# Patient Record
Sex: Female | Born: 1940 | Race: White | Hispanic: No | Marital: Married | Smoking: Never smoker
Health system: Southern US, Community
[De-identification: ages and names within clinical notes are randomized; demographics above are authoritative.]

## PROBLEM LIST (undated history)

## (undated) DIAGNOSIS — F32A Depression, unspecified: Secondary | ICD-10-CM

## (undated) DIAGNOSIS — E785 Hyperlipidemia, unspecified: Secondary | ICD-10-CM

## (undated) DIAGNOSIS — M1990 Unspecified osteoarthritis, unspecified site: Secondary | ICD-10-CM

## (undated) DIAGNOSIS — I2510 Atherosclerotic heart disease of native coronary artery without angina pectoris: Secondary | ICD-10-CM

## (undated) DIAGNOSIS — F329 Major depressive disorder, single episode, unspecified: Secondary | ICD-10-CM

## (undated) DIAGNOSIS — I10 Essential (primary) hypertension: Secondary | ICD-10-CM

## (undated) DIAGNOSIS — F419 Anxiety disorder, unspecified: Secondary | ICD-10-CM

## (undated) DIAGNOSIS — E079 Disorder of thyroid, unspecified: Secondary | ICD-10-CM

## (undated) DIAGNOSIS — I4891 Unspecified atrial fibrillation: Secondary | ICD-10-CM

## (undated) HISTORY — DX: Unspecified osteoarthritis, unspecified site: M19.90

## (undated) HISTORY — PX: PACEMAKER INSERTION: SHX728

## (undated) HISTORY — DX: Major depressive disorder, single episode, unspecified: F32.9

## (undated) HISTORY — DX: Essential (primary) hypertension: I10

## (undated) HISTORY — PX: CHOLECYSTECTOMY: SHX55

## (undated) HISTORY — DX: Anxiety disorder, unspecified: F41.9

## (undated) HISTORY — PX: CORONARY ARTERY BYPASS GRAFT: SHX141

## (undated) HISTORY — PX: APPENDECTOMY: SHX54

## (undated) HISTORY — DX: Hyperlipidemia, unspecified: E78.5

## (undated) HISTORY — PX: BLADDER SURGERY: SHX569

## (undated) HISTORY — DX: Disorder of thyroid, unspecified: E07.9

## (undated) HISTORY — DX: Depression, unspecified: F32.A

## (undated) HISTORY — PX: CARDIAC CATHETERIZATION: SHX172

## (undated) HISTORY — DX: Unspecified atrial fibrillation: I48.91

## (undated) SURGERY — Surgical Case
Anesthesia: *Unknown

## (undated) SURGERY — LEFT HEART CATH
Anesthesia: Moderate Sedation

---

## 1970-04-17 HISTORY — PX: VAGINAL HYSTERECTOMY: SUR661

## 2014-01-08 LAB — CBC AND DIFFERENTIAL
HCT: 42 % (ref 36–46)
HEMOGLOBIN: 13.7 g/dL (ref 12.0–16.0)
Platelets: 280 10*3/uL (ref 150–399)
WBC: 9.2 10^3/mL

## 2014-02-20 LAB — BASIC METABOLIC PANEL
BUN: 16 mg/dL (ref 4–21)
Creatinine: 1.1 mg/dL (ref <=1.1)
Glucose: 132 mg/dL
Potassium: 3.8 mmol/L (ref 3.4–5.3)
SODIUM: 142 mmol/L (ref 137–147)

## 2014-02-20 LAB — HEPATIC FUNCTION PANEL
ALK PHOS: 190 U/L — AB (ref 25–125)
ALT: 73 U/L — AB (ref 7–35)
AST: 87 U/L — AB (ref 13–35)
BILIRUBIN, TOTAL: 0.3 mg/dL

## 2014-06-30 LAB — TSH: TSH: 13.54 u[IU]/mL — AB (ref <=5.90)

## 2014-07-21 DIAGNOSIS — Z950 Presence of cardiac pacemaker: Secondary | ICD-10-CM | POA: Insufficient documentation

## 2014-09-05 DIAGNOSIS — J309 Allergic rhinitis, unspecified: Secondary | ICD-10-CM | POA: Insufficient documentation

## 2014-09-05 DIAGNOSIS — K5790 Diverticulosis of intestine, part unspecified, without perforation or abscess without bleeding: Secondary | ICD-10-CM | POA: Insufficient documentation

## 2014-09-05 DIAGNOSIS — I779 Disorder of arteries and arterioles, unspecified: Secondary | ICD-10-CM | POA: Insufficient documentation

## 2014-09-05 DIAGNOSIS — G603 Idiopathic progressive neuropathy: Secondary | ICD-10-CM | POA: Insufficient documentation

## 2014-09-05 DIAGNOSIS — I4891 Unspecified atrial fibrillation: Secondary | ICD-10-CM | POA: Insufficient documentation

## 2014-09-05 DIAGNOSIS — I350 Nonrheumatic aortic (valve) stenosis: Secondary | ICD-10-CM | POA: Insufficient documentation

## 2014-09-05 DIAGNOSIS — I252 Old myocardial infarction: Secondary | ICD-10-CM | POA: Insufficient documentation

## 2014-09-05 DIAGNOSIS — I2510 Atherosclerotic heart disease of native coronary artery without angina pectoris: Secondary | ICD-10-CM | POA: Insufficient documentation

## 2014-09-05 DIAGNOSIS — F329 Major depressive disorder, single episode, unspecified: Secondary | ICD-10-CM | POA: Insufficient documentation

## 2014-09-05 DIAGNOSIS — M1990 Unspecified osteoarthritis, unspecified site: Secondary | ICD-10-CM | POA: Insufficient documentation

## 2014-09-05 DIAGNOSIS — M797 Fibromyalgia: Secondary | ICD-10-CM | POA: Insufficient documentation

## 2014-09-05 DIAGNOSIS — I739 Peripheral vascular disease, unspecified: Secondary | ICD-10-CM

## 2014-09-05 DIAGNOSIS — E039 Hypothyroidism, unspecified: Secondary | ICD-10-CM | POA: Insufficient documentation

## 2014-09-05 DIAGNOSIS — I10 Essential (primary) hypertension: Secondary | ICD-10-CM | POA: Insufficient documentation

## 2014-09-05 DIAGNOSIS — F09 Unspecified mental disorder due to known physiological condition: Secondary | ICD-10-CM | POA: Insufficient documentation

## 2014-09-05 DIAGNOSIS — E785 Hyperlipidemia, unspecified: Secondary | ICD-10-CM | POA: Insufficient documentation

## 2014-09-05 DIAGNOSIS — C44311 Basal cell carcinoma of skin of nose: Secondary | ICD-10-CM | POA: Insufficient documentation

## 2014-09-05 DIAGNOSIS — F419 Anxiety disorder, unspecified: Secondary | ICD-10-CM

## 2014-09-05 DIAGNOSIS — I517 Cardiomegaly: Secondary | ICD-10-CM | POA: Insufficient documentation

## 2014-09-29 ENCOUNTER — Encounter: Payer: Self-pay | Admitting: Family Medicine

## 2014-09-29 ENCOUNTER — Ambulatory Visit (INDEPENDENT_AMBULATORY_CARE_PROVIDER_SITE_OTHER): Payer: Medicare Other | Admitting: Family Medicine

## 2014-09-29 VITALS — BP 130/64 | HR 61 | Temp 98.1°F | Resp 16

## 2014-09-29 DIAGNOSIS — Z8669 Personal history of other diseases of the nervous system and sense organs: Secondary | ICD-10-CM | POA: Diagnosis not present

## 2014-09-29 DIAGNOSIS — Z283 Underimmunization status: Secondary | ICD-10-CM

## 2014-09-29 DIAGNOSIS — Z2839 Other underimmunization status: Secondary | ICD-10-CM

## 2014-09-29 NOTE — Progress Notes (Signed)
Subjective:     Patient ID: Ruth Hodges, female   DOB: Sep 18, 1940, 74 y.o.   MRN: [id number]  HPI  Chief Complaint  Patient presents with  . Eye Drainage    patient reports left eye draining and crusting of lid intermittent for the past 3-4 months she also reports of sinus headache behind left eye.   states both intermittent eye pain and clear, watery drainage have been chronic problems for years.   Review of Systems  Eyes: Negative for visual disturbance (no change in her vision during these episodes).       Objective:   Physical Exam  Constitutional: She appears well-developed and well-nourished. No distress.  Eyes: EOM are normal. Pupils are equal, round, and reactive to light. Right eye exhibits no discharge. Left eye exhibits no discharge.       Assessment:      1. History of drainage from eye  - Ambulatory referral to Ophthalmology  2. Immunization deficiency Insurance does not cover Zostavax/ patient defers immunization    Plan:     F/u with primary M.D.per routine

## 2014-09-29 NOTE — Patient Instructions (Addendum)
You will be called regarding eye appointment.

## 2014-10-09 ENCOUNTER — Other Ambulatory Visit: Payer: Self-pay | Admitting: Family Medicine

## 2014-10-12 ENCOUNTER — Other Ambulatory Visit: Payer: Self-pay | Admitting: Family Medicine

## 2014-10-12 ENCOUNTER — Telehealth: Payer: Self-pay | Admitting: Family Medicine

## 2014-10-12 NOTE — Telephone Encounter (Signed)
This is Dr. Gilbert's patient. Please review. Thank you-aa 

## 2014-10-12 NOTE — Telephone Encounter (Signed)
Pt contacted office for refill request on the following medications:  traMADol (ULTRAM) 50 MG tablet.  Right [place].  CB#[phone]

## 2014-10-12 NOTE — Telephone Encounter (Signed)
Pt called to see if the RX for Tramadol 50 mg could be resent to [facility]. [facility]. Because she was told they didn't receive the RX. It looks like Dr. Rosanna Randy sent it on 10/09/14. Pt stated she is out of the Tramadol 50 mg. Thanks TNP

## 2014-10-13 ENCOUNTER — Other Ambulatory Visit: Payer: Self-pay

## 2014-10-14 ENCOUNTER — Inpatient Hospital Stay
Admission: EM | Admit: 2014-10-14 | Discharge: 2014-10-16 | DRG: 281 | Disposition: A | Discharge status: Home / Self Care | Payer: Medicare Other | Attending: Internal Medicine | Admitting: Internal Medicine

## 2014-10-14 ENCOUNTER — Observation Stay
Admit: 2014-10-14 | Discharge: 2014-10-14 | Disposition: A | Discharge status: Home / Self Care | Payer: Medicare Other | Attending: Internal Medicine | Admitting: Internal Medicine

## 2014-10-14 ENCOUNTER — Emergency Department: Payer: Medicare Other

## 2014-10-14 ENCOUNTER — Encounter: Payer: Self-pay | Admitting: Emergency Medicine

## 2014-10-14 DIAGNOSIS — M1990 Unspecified osteoarthritis, unspecified site: Secondary | ICD-10-CM | POA: Diagnosis present

## 2014-10-14 DIAGNOSIS — Z7982 Long term (current) use of aspirin: Secondary | ICD-10-CM

## 2014-10-14 DIAGNOSIS — I200 Unstable angina: Secondary | ICD-10-CM

## 2014-10-14 DIAGNOSIS — I2510 Atherosclerotic heart disease of native coronary artery without angina pectoris: Secondary | ICD-10-CM | POA: Diagnosis present

## 2014-10-14 DIAGNOSIS — I10 Essential (primary) hypertension: Secondary | ICD-10-CM | POA: Diagnosis present

## 2014-10-14 DIAGNOSIS — I25110 Atherosclerotic heart disease of native coronary artery with unstable angina pectoris: Secondary | ICD-10-CM | POA: Diagnosis present

## 2014-10-14 DIAGNOSIS — F329 Major depressive disorder, single episode, unspecified: Secondary | ICD-10-CM | POA: Diagnosis present

## 2014-10-14 DIAGNOSIS — I214 Non-ST elevation (NSTEMI) myocardial infarction: Secondary | ICD-10-CM | POA: Diagnosis present

## 2014-10-14 DIAGNOSIS — Z885 Allergy status to narcotic agent status: Secondary | ICD-10-CM

## 2014-10-14 DIAGNOSIS — Z955 Presence of coronary angioplasty implant and graft: Secondary | ICD-10-CM

## 2014-10-14 DIAGNOSIS — F419 Anxiety disorder, unspecified: Secondary | ICD-10-CM | POA: Diagnosis present

## 2014-10-14 DIAGNOSIS — Z7901 Long term (current) use of anticoagulants: Secondary | ICD-10-CM

## 2014-10-14 DIAGNOSIS — N390 Urinary tract infection, site not specified: Secondary | ICD-10-CM | POA: Diagnosis present

## 2014-10-14 DIAGNOSIS — Z888 Allergy status to other drugs, medicaments and biological substances status: Secondary | ICD-10-CM

## 2014-10-14 DIAGNOSIS — Z951 Presence of aortocoronary bypass graft: Secondary | ICD-10-CM

## 2014-10-14 DIAGNOSIS — E785 Hyperlipidemia, unspecified: Secondary | ICD-10-CM | POA: Diagnosis present

## 2014-10-14 DIAGNOSIS — Z950 Presence of cardiac pacemaker: Secondary | ICD-10-CM

## 2014-10-14 DIAGNOSIS — E039 Hypothyroidism, unspecified: Secondary | ICD-10-CM | POA: Diagnosis present

## 2014-10-14 DIAGNOSIS — Z79899 Other long term (current) drug therapy: Secondary | ICD-10-CM

## 2014-10-14 DIAGNOSIS — E876 Hypokalemia: Secondary | ICD-10-CM | POA: Diagnosis present

## 2014-10-14 DIAGNOSIS — I252 Old myocardial infarction: Secondary | ICD-10-CM

## 2014-10-14 DIAGNOSIS — I272 Other secondary pulmonary hypertension: Secondary | ICD-10-CM | POA: Diagnosis present

## 2014-10-14 DIAGNOSIS — I4891 Unspecified atrial fibrillation: Secondary | ICD-10-CM | POA: Diagnosis present

## 2014-10-14 HISTORY — DX: Atherosclerotic heart disease of native coronary artery without angina pectoris: I25.10

## 2014-10-14 LAB — URINALYSIS COMPLETE WITH MICROSCOPIC (ARMC ONLY)
Bilirubin Urine: NEGATIVE
Glucose, UA: NEGATIVE mg/dL
KETONES UR: NEGATIVE mg/dL
Nitrite: POSITIVE — AB
PROTEIN: NEGATIVE mg/dL
Specific Gravity, Urine: 1.012 (ref 1.005–1.030)
pH: 5 (ref 5.0–8.0)

## 2014-10-14 LAB — CBC
HCT: 33.3 % — ABNORMAL LOW (ref 35.0–47.0)
HEMATOCRIT: 32.8 % — AB (ref 35.0–47.0)
HEMOGLOBIN: 10.4 g/dL — AB (ref 12.0–16.0)
Hemoglobin: 10.2 g/dL — ABNORMAL LOW (ref 12.0–16.0)
MCH: 26.8 pg (ref 26.0–34.0)
MCH: 27.1 pg (ref 26.0–34.0)
MCHC: 31.1 g/dL — ABNORMAL LOW (ref 32.0–36.0)
MCHC: 31.2 g/dL — AB (ref 32.0–36.0)
MCV: 86.2 fL (ref 80.0–100.0)
MCV: 86.8 fL (ref 80.0–100.0)
PLATELETS: 188 10*3/uL (ref 150–440)
PLATELETS: 167 10*3/uL (ref 150–440)
RBC: 3.8 MIL/uL (ref 3.80–5.20)
RBC: 3.84 MIL/uL (ref 3.80–5.20)
RDW: 15.8 % — ABNORMAL HIGH (ref 11.5–14.5)
RDW: 15.7 % — ABNORMAL HIGH (ref 11.5–14.5)
WBC: 11.4 10*3/uL — AB (ref 3.6–11.0)
WBC: 8.2 10*3/uL (ref 3.6–11.0)

## 2014-10-14 LAB — COMPREHENSIVE METABOLIC PANEL
ALBUMIN: 3.2 g/dL — AB (ref 3.5–5.0)
ALT: 20 U/L (ref 14–54)
ANION GAP: 10 (ref 5–15)
AST: 40 U/L (ref 15–41)
Alkaline Phosphatase: 133 U/L — ABNORMAL HIGH (ref 38–126)
BUN: 13 mg/dL (ref 6–20)
CO2: 25 mmol/L (ref 22–32)
Calcium: 8.7 mg/dL — ABNORMAL LOW (ref 8.9–10.3)
Chloride: 106 mmol/L (ref 101–111)
Creatinine, Ser: 0.87 mg/dL (ref 0.44–1.00)
GFR calc Af Amer: >60 mL/min (ref >60)
GFR calc non Af Amer: >60 mL/min (ref >60)
GLUCOSE: 131 mg/dL — AB (ref 65–99)
POTASSIUM: 3.2 mmol/L — AB (ref 3.5–5.1)
SODIUM: 141 mmol/L (ref 135–145)
TOTAL PROTEIN: 6.6 g/dL (ref 6.5–8.1)
Total Bilirubin: 0.4 mg/dL (ref 0.3–1.2)

## 2014-10-14 LAB — APTT: aPTT: 54 seconds — ABNORMAL HIGH (ref 24–36)

## 2014-10-14 LAB — BASIC METABOLIC PANEL
ANION GAP: 10 (ref 5–15)
BUN: 14 mg/dL (ref 6–20)
CALCIUM: 9 mg/dL (ref 8.9–10.3)
CHLORIDE: 106 mmol/L (ref 101–111)
CO2: 28 mmol/L (ref 22–32)
Creatinine, Ser: 0.83 mg/dL (ref 0.44–1.00)
GFR calc non Af Amer: >60 mL/min (ref >60)
Glucose, Bld: 96 mg/dL (ref 65–99)
Potassium: 3.3 mmol/L — ABNORMAL LOW (ref 3.5–5.1)
SODIUM: 144 mmol/L (ref 135–145)

## 2014-10-14 LAB — HEPARIN LEVEL (UNFRACTIONATED)

## 2014-10-14 LAB — TSH: TSH: 1.227 u[IU]/mL (ref 0.350–4.500)

## 2014-10-14 LAB — TROPONIN I
TROPONIN I: 0.07 ng/mL — AB (ref <0.031)
Troponin I: 0.04 ng/mL — ABNORMAL HIGH (ref <0.031)
Troponin I: 0.12 ng/mL — ABNORMAL HIGH (ref <0.031)

## 2014-10-14 LAB — GLUCOSE, CAPILLARY: Glucose-Capillary: 84 mg/dL (ref 65–99)

## 2014-10-14 LAB — MRSA PCR SCREENING: MRSA BY PCR: NEGATIVE

## 2014-10-14 MED ORDER — TRAMADOL HCL 50 MG PO TABS: 50.0000 mg | ORAL_TABLET | Freq: Three times a day (TID) | ORAL | Status: DC

## 2014-10-14 MED ORDER — FELODIPINE ER 5 MG PO TB24
10.0000 mg | ORAL_TABLET | Freq: Every day | ORAL | Status: DC
  Administered 2014-10-14 – 2014-10-15 (×2): 10 mg via ORAL
  Filled 2014-10-14: qty 2
  Filled 2014-10-14: qty 1

## 2014-10-14 MED ORDER — AMITRIPTYLINE HCL 50 MG PO TABS
100.0000 mg | ORAL_TABLET | Freq: Every day | ORAL | Status: DC
  Administered 2014-10-14 – 2014-10-15 (×2): 100 mg via ORAL
  Filled 2014-10-14 (×2): qty 2

## 2014-10-14 MED ORDER — TRAMADOL HCL 50 MG PO TABS
50.0000 mg | ORAL_TABLET | Freq: Three times a day (TID) | ORAL | Status: DC | PRN
  Administered 2014-10-14 – 2014-10-16 (×5): 50 mg via ORAL
  Filled 2014-10-14 (×5): qty 1

## 2014-10-14 MED ORDER — ACETAMINOPHEN 325 MG PO TABS
650.0000 mg | ORAL_TABLET | Freq: Four times a day (QID) | ORAL | Status: DC | PRN
  Administered 2014-10-14: 650 mg via ORAL
  Filled 2014-10-14: qty 2

## 2014-10-14 MED ORDER — ACETAMINOPHEN 650 MG RE SUPP: 650.0000 mg | Freq: Four times a day (QID) | RECTAL | Status: DC | PRN

## 2014-10-14 MED ORDER — HEPARIN (PORCINE) IN NACL 100-0.45 UNIT/ML-% IJ SOLN
1100.0000 [IU]/h | INTRAMUSCULAR | Status: DC
  Administered 2014-10-14: 850 [IU]/h via INTRAVENOUS
  Administered 2014-10-15: 1150 [IU]/h via INTRAVENOUS
  Filled 2014-10-14 (×6): qty 250

## 2014-10-14 MED ORDER — SIMVASTATIN 40 MG PO TABS
40.0000 mg | ORAL_TABLET | Freq: Every evening | ORAL | Status: DC
  Administered 2014-10-14 – 2014-10-15 (×2): 40 mg via ORAL
  Filled 2014-10-14 (×2): qty 1

## 2014-10-14 MED ORDER — ASPIRIN EC 325 MG PO TBEC
325.0000 mg | DELAYED_RELEASE_TABLET | Freq: Once | ORAL | Status: DC
  Filled 2014-10-14: qty 1

## 2014-10-14 MED ORDER — LEVOTHYROXINE SODIUM 150 MCG PO TABS
150.0000 ug | ORAL_TABLET | Freq: Every day | ORAL | Status: DC
  Administered 2014-10-14 – 2014-10-16 (×3): 150 ug via ORAL
  Filled 2014-10-14 (×3): qty 1

## 2014-10-14 MED ORDER — SODIUM CHLORIDE 0.9 % IJ SOLN
3.0000 mL | Freq: Two times a day (BID) | INTRAMUSCULAR | Status: DC
  Administered 2014-10-14 – 2014-10-16 (×5): 3 mL via INTRAVENOUS

## 2014-10-14 MED ORDER — APIXABAN 5 MG PO TABS
5.0000 mg | ORAL_TABLET | Freq: Two times a day (BID) | ORAL | Status: DC
  Administered 2014-10-14: 5 mg via ORAL
  Filled 2014-10-14: qty 1

## 2014-10-14 MED ORDER — POTASSIUM CHLORIDE CRYS ER 20 MEQ PO TBCR
20.0000 meq | EXTENDED_RELEASE_TABLET | Freq: Two times a day (BID) | ORAL | Status: DC
  Administered 2014-10-14 – 2014-10-16 (×5): 20 meq via ORAL
  Filled 2014-10-14 (×5): qty 1

## 2014-10-14 MED ORDER — LOSARTAN POTASSIUM 50 MG PO TABS
100.0000 mg | ORAL_TABLET | Freq: Every day | ORAL | Status: DC
  Administered 2014-10-14 – 2014-10-16 (×3): 100 mg via ORAL
  Filled 2014-10-14 (×3): qty 2

## 2014-10-14 MED ORDER — SODIUM CHLORIDE 0.9 % IV SOLN
INTRAVENOUS | Status: AC
  Administered 2014-10-14: 06:00:00 via INTRAVENOUS

## 2014-10-14 MED ORDER — DIGOXIN 125 MCG PO TABS
0.1250 mg | ORAL_TABLET | Freq: Every day | ORAL | Status: DC
  Administered 2014-10-14 – 2014-10-16 (×3): 0.125 mg via ORAL
  Filled 2014-10-14 (×3): qty 1

## 2014-10-14 NOTE — Progress Notes (Addendum)
ANTICOAGULATION CONSULT NOTE - Initial Consult  Pharmacy Consult for Heparin Indication: chest pain/ACS  Allergies  Allergen Reactions  . Atorvastatin   . Codeine   . Ceftin  [Cefuroxime Axetil]     Patient Measurements: Height: 5\' 3"  (160 cm) Weight: 167 lb 8 oz (75.978 kg) IBW/kg (Calculated) : 52.4 Heparin Dosing Weight: 70.2 kg  Vital Signs: Temp: 97.8 F (36.6 C) (06/29 1137) Temp Source: Oral (06/29 1137) BP: 117/44 mmHg (06/29 1137) Pulse Rate: 65 (06/29 1137)  Labs:  Recent Labs  10/14/14 0034 10/14/14 1024  HGB 10.2* 10.4*  HCT 32.8* 33.3*  PLT 188 167  CREATININE 0.87 0.83  TROPONINI 0.04* 0.12*    Estimated Creatinine Clearance: 56.3 mL/min (by C-G formula based on Cr of 0.83).   Medical History: Past Medical History  Diagnosis Date  . Anxiety   . Depression   . A-fib   . Arthritis   . Hypertension   . Hyperlipidemia   . Thyroid disease   . CAD (coronary artery disease)     Medications:  Scheduled:  . digoxin  0.125 mg Oral Daily  . felodipine  10 mg Oral Daily  . levothyroxine  150 mcg Oral QAC breakfast  . losartan  100 mg Oral Daily  . potassium chloride  20 mEq Oral BID  . simvastatin  40 mg Oral QPM  . sodium chloride  3 mL Intravenous Q12H   Infusions:  . sodium chloride 100 mL/hr at 10/14/14 0607  . heparin     PRN: acetaminophen **OR** acetaminophen, traMADol  Assessment: Patient admitted with CP and a h/o afib on apixaban now with elevated troponin to transition to heparin drip.   Goal of Therapy:  Heparin level 0.3-0.7 units/ml or goal APTT Monitor platelets by anticoagulation protocol: Yes   Plan:  Will order baseline aPTT and HL but will likely need to dose by aPTT as patient previously on apixaban. Will start heparin drip without bolus at 850 units/hr.   Ulice Dash D 10/14/2014,2:12 PM

## 2014-10-14 NOTE — ED Provider Notes (Signed)
[facility] Emergency Department Provider Note  ____________________________________________  Time seen: 1:50 AM  I have reviewed the triage vital signs and the nursing notes.   HISTORY  Chief Complaint Chest Pain      HPI Ruth Hodges is a 74 y.o. female resents with intermittent chest pain 3 days. Patient states that the pain radiates to her back and left arm patient is currently pain-free this is status post taking 4 sublingual nitroglycerin. Patient states that the onset of the pain was when she was trying to go to sleep. Of note patient has a history of three-vessel coronary artery bypass graft which was performed 19 years ago, in addition patient has had 2 cardiac stents placed approximately 8-9 years ago. Patient denies any dyspnea no chest pain at present no pain or swelling in her extremities    Past Medical History  Diagnosis Date  . Anxiety   . Depression   . A-fib   . Arthritis   . Hypertension   . Hyperlipidemia   . Thyroid disease   . Fibrositis   . Carotid arterial disease     Patient Active Problem List   Diagnosis Date Noted  . Allergic rhinitis 09/05/2014  . Arthritis 09/05/2014  . A-fib 09/05/2014  . 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 Arteriosclerosis of coronary artery 09/05/2014  . Anxiety and depression 09/05/2014  . Essential (primary) hypertension 09/05/2014  . Fibrositis 09/05/2014  . H/O acute myocardial infarction 09/05/2014  . DD (diverticular disease) 09/05/2014  . HLD (hyperlipidemia) 09/05/2014  . Adult hypothyroidism 09/05/2014  . 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 Mild cognitive disorder 09/05/2014  . Carotid arterial disease 09/05/2014  . Left ventricular hypertrophy 09/05/2014  . Aortic heart valve narrowing 09/05/2014  . Artificial cardiac pacemaker 07/21/2014    Past Surgical History  Procedure Laterality Date  . Pacemaker insertion    . Cardiac catheterization    .  Bladder surgery    . Cholecystectomy    . Appendectomy    . Coronary artery bypass graft    . Vaginal hysterectomy  1972    total, ovaries removed in 1983    Current Outpatient Rx  Name  Route  Sig  Dispense  Refill  . amiodarone (PACERONE) 200 MG tablet   Oral   Take by mouth.         Marland Kitchen amitriptyline (ELAVIL) 100 MG tablet   Oral   Take by mouth.         Marland Kitchen apixaban (ELIQUIS) 5 MG TABS tablet   Oral   Take by mouth.         . Biotin 2500 MCG CAPS   Oral   Take by mouth.         Marland Kitchen CALCIUM-VITAMIN D PO   Oral   Take by mouth.         . Cholecalciferol (VITAMIN D3) 1000 UNITS CAPS   Oral   Take by mouth.         . cyanocobalamin 100 MCG tablet   Oral   Take by mouth.         . digoxin (LANOXIN) 0.125 MG tablet   Oral   Take by mouth.         . felodipine (PLENDIL) 10 MG 24 hr tablet   Oral   Take by mouth.         . levothyroxine (SYNTHROID, LEVOTHROID) 150 MCG tablet   Oral   Take by mouth.         Marland Kitchen  losartan (COZAAR) 100 MG tablet   Oral   Take by mouth.         Ernestine Conrad 3-6-9 CAPS   Oral   Take by mouth.         . simvastatin (ZOCOR) 40 MG tablet   Oral   Take by mouth.         . torsemide (DEMADEX) 20 MG tablet   Oral   Take by mouth.         . traMADol (ULTRAM) 50 MG tablet      take 1 tablet by mouth three times a day   90 tablet   0   . zinc gluconate 50 MG tablet   Oral   Take by mouth.           Allergies Atorvastatin; Codeine; and Ceftin   Family History  Problem Relation Age of Onset  . Heart attack Father   . CVA Sister   . Diabetes Brother   . Heart attack Brother     Social History History  Substance Use Topics  . Smoking status: Never Smoker   . Smokeless tobacco: Not on file  . Alcohol Use: Not on file    Review of Systems  Constitutional: Negative for fever. Eyes: Negative for visual changes. ENT: Negative for sore throat. Cardiovascular: Positive for chest  pain. Respiratory: Negative for shortness of breath. Gastrointestinal: Negative for abdominal pain, vomiting and diarrhea. Genitourinary: Negative for dysuria. Musculoskeletal: Negative for back pain. Skin: Negative for rash. Neurological: Negative for headaches, focal weakness or numbness.   10-point ROS otherwise negative.  ____________________________________________   PHYSICAL EXAM:  VITAL SIGNS: ED Triage Vitals  Enc Vitals Group     BP 10/14/14 0207 123/56 mmHg     Pulse Rate 10/14/14 0207 61     Resp 10/14/14 0207 17     Temp 10/14/14 0207 98.1 F (36.7 C)     Temp Source 10/14/14 0207 Oral     SpO2 10/14/14 0207 98 %     Weight 10/14/14 0207 179 lb (81.194 kg)     Height 10/14/14 0207 5\' 3"  (1.6 m)     Head Cir --      Peak Flow --      Pain Score --      Pain Loc --      Pain Edu? --      Excl. in [place]? --      Constitutional: Alert and oriented. Well appearing and in no distress. Eyes: Conjunctivae are normal. PERRL. Normal extraocular movements. ENT   Head: Normocephalic and atraumatic.   Nose: No congestion/rhinnorhea.   Mouth/Throat: Mucous membranes are moist.   Neck: No stridor. Cardiovascular: Normal rate, regular rhythm. Normal and symmetric distal pulses are present in all extremities. Grade 3 systolic ejection murmur Respiratory: Normal respiratory effort without tachypnea nor retractions. Breath sounds are clear and equal bilaterally. No wheezes/rales/rhonchi. Gastrointestinal: Soft and nontender. No distention. There is no CVA tenderness. Genitourinary: deferred Musculoskeletal: Nontender with normal range of motion in all extremities. No joint effusions.  No lower extremity tenderness nor edema. Neurologic:  Normal speech and language. No gross focal neurologic deficits are appreciated. Speech is normal.  Skin:  Skin is warm, dry and intact. No rash noted. Psychiatric: Mood and affect are normal. Speech and behavior are normal.  Patient exhibits appropriate insight and judgment.  ____________________________________________    LABS (pertinent positives/negatives)  Labs Reviewed  CBC - Abnormal; Notable for the following:  WBC 11.4 (*)    Hemoglobin 10.2 (*)    HCT 32.8 (*)    MCHC 31.1 (*)    RDW 15.8 (*)    All other components within normal limits  COMPREHENSIVE METABOLIC PANEL - Abnormal; Notable for the following:    Potassium 3.2 (*)    Glucose, Bld 131 (*)    Calcium 8.7 (*)    Albumin 3.2 (*)    Alkaline Phosphatase 133 (*)    All other components within normal limits  TROPONIN I - Abnormal; Notable for the following:    Troponin I 0.04 (*)    All other components within normal limits    Date: 10/14/2014  Rate: 60  Rhythm: normal sinus rhythm  QRS Axis: normal  Intervals: normal  ST/T Wave abnormalities: normal  Conduction Disutrbances: none  Narrative Interpretation: unremarkable          RADIOLOGY  Chest x-ray revealed: IMPRESSION: No acute pulmonary process. ____________________________________________    INITIAL IMPRESSION / ASSESSMENT AND PLAN / ED COURSE  Pertinent labs & imaging results that were available during my care of the patient were reviewed by me and considered in my medical decision making (see chart for details).  Patient took aspirin before presentation to the emergency department as well as 4 nitroglycerin with no chest pain at present. EKG revealed no ST segment changes. ____________________________________________   FINAL CLINICAL IMPRESSION(S) / ED DIAGNOSES  Final diagnoses:  NSTEMI (non-ST elevated myocardial infarction)      Gregor Hams, MD 10/14/14 [phone]

## 2014-10-14 NOTE — H&P (Signed)
[facility] [place] NAME: Ruth Hodges    MR#:  [id number]  DATE OF BIRTH:  [birth year]  DATE OF ADMISSION:  10/14/2014  PRIMARY CARE PHYSICIAN: Wilhemena Durie, MD   REQUESTING/REFERRING PHYSICIAN: Owens Shark  CHIEF COMPLAINT:   Chief Complaint  Patient presents with  . Chest Pain    HISTORY OF PRESENT ILLNESS:  Ruth Hodges  is a 74 y.o. female who presents with an episode of unstable angina. Patient has known history of CAD, including CABG in the past, and subsequent stent placements. She states that she began having some chest pain the day prior to presenting to the ED, but that this chest pain resolved with aspirin and sublingual nitroglycerin. The day of presentation to ED she began having chest pain while at rest in her bed, which was not resolved by aspirin and sublingual nitroglycerin. This chest pain was substernal in origin, with radiation to bilateral shoulders and also "straight through to my back". It was not associated with significant diaphoresis or shortness of breath, nausea or vomiting, but the patient called EMS because the pain was not resolving. In the ED she was found to have a mildly elevated troponin is 0.04, but given her known cardiac history and complaint of chest pain, hospitalists were called for admission. Chest pain had resolved by the time of this writer's interview.  PAST MEDICAL HISTORY:   Past Medical History  Diagnosis Date  . Anxiety   . Depression   . A-fib   . Arthritis   . Hypertension   . Hyperlipidemia   . Thyroid disease   . Fibrositis   . Carotid arterial disease   . CAD (coronary artery disease)     PAST SURGICAL HISTORY:   Past Surgical History  Procedure Laterality Date  . Pacemaker insertion    . Cardiac catheterization    . Bladder surgery    . Cholecystectomy    . Appendectomy    . Coronary artery bypass graft    . Vaginal hysterectomy  1972    total, ovaries removed in  1983    SOCIAL HISTORY:   History  Substance Use Topics  . Smoking status: Never Smoker   . Smokeless tobacco: Not on file  . Alcohol Use: No    FAMILY HISTORY:   Family History  Problem Relation Age of Onset  . Heart attack Father   . CVA Sister   . Diabetes Brother   . Heart attack Brother     DRUG ALLERGIES:   Allergies  Allergen Reactions  . Atorvastatin   . Codeine   . Ceftin  [Cefuroxime Axetil]     MEDICATIONS AT HOME:   Prior to Admission medications   Medication Sig Start Date End Date Taking? Authorizing Provider  amitriptyline (ELAVIL) 100 MG tablet Take by mouth at bedtime.  08/31/14  Yes Historical Provider, MD  apixaban (ELIQUIS) 5 MG TABS tablet Take by mouth 2 (two) times daily.  08/31/14  Yes Historical Provider, MD  Biotin 2500 MCG CAPS Take by mouth. 08/31/14  Yes Historical Provider, MD  Cholecalciferol (VITAMIN D3) 1000 UNITS CAPS Take by mouth. 08/31/14  Yes Historical Provider, MD  cyanocobalamin 100 MCG tablet Take by mouth. 08/31/14  Yes Historical Provider, MD  digoxin (LANOXIN) 0.125 MG tablet Take 0.125 mg by mouth. Pt takes digitek 04/29/14  Yes Historical Provider, MD  felodipine (PLENDIL) 10 MG 24 hr tablet Take by mouth daily.  08/31/14  Yes Historical  Provider, MD  levothyroxine (SYNTHROID, LEVOTHROID) 150 MCG tablet Take by mouth daily.  04/29/14  Yes Historical Provider, MD  losartan (COZAAR) 100 MG tablet Take 100 mg by mouth daily.  04/29/14  Yes Historical Provider, MD  Omega 3-6-9 CAPS Take by mouth. 08/31/14  Yes Historical Provider, MD  torsemide (DEMADEX) 20 MG tablet Take by mouth daily.  08/31/14  Yes Historical Provider, MD  traMADol (ULTRAM) 50 MG tablet take 1 tablet by mouth three times a day Patient taking differently: take 1 tablet by mouth three times a day prn 10/12/14  Yes Birdie Sons, MD  amiodarone (PACERONE) 200 MG tablet Take by mouth. 08/31/14   Historical Provider, MD  CALCIUM-VITAMIN D PO Take by mouth. 08/31/14    Historical Provider, MD  simvastatin (ZOCOR) 40 MG tablet Take by mouth every evening.  08/31/14   Historical Provider, MD  zinc gluconate 50 MG tablet Take by mouth. 08/31/14   Historical Provider, MD    REVIEW OF SYSTEMS:  Review of Systems  Constitutional: Negative for fever, chills, weight loss and malaise/fatigue.  HENT: Negative for ear pain, hearing loss and tinnitus.   Eyes: Negative for blurred vision, double vision, pain and redness.  Respiratory: Negative for cough, hemoptysis and shortness of breath.   Cardiovascular: Positive for chest pain. Negative for palpitations, orthopnea and leg swelling.  Gastrointestinal: Negative for nausea, vomiting, abdominal pain, diarrhea and constipation.  Genitourinary: Negative for dysuria, frequency and hematuria.  Musculoskeletal: Negative for back pain, joint pain and neck pain.  Skin:       No acne, rash, or lesions  Neurological: Negative for dizziness, tremors, focal weakness and weakness.  Endo/Heme/Allergies: Negative for polydipsia. Does not bruise/bleed easily.  Psychiatric/Behavioral: Negative for depression. The patient is not nervous/anxious and does not have insomnia.      VITAL SIGNS:   Filed Vitals:   10/14/14 0207 10/14/14 0224 10/14/14 0300 10/14/14 0330  BP: 123/56  115/59 123/49  Pulse: 61 60 59 60  Temp: 98.1 F (36.7 C)     TempSrc: Oral     Resp: 17 23 12 12   Height: 5\' 3"  (1.6 m)     Weight: 81.194 kg (179 lb)     SpO2: 98% 100% 92% 92%   Wt Readings from Last 3 Encounters:  10/14/14 81.194 kg (179 lb)  06/30/14 78.472 kg (173 lb)    PHYSICAL EXAMINATION:  Physical Exam  Constitutional: She is oriented to person, place, and time. She appears well-developed and well-nourished. No distress.  HENT:  Head: Normocephalic and atraumatic.  Mouth/Throat: Oropharynx is clear and moist.  Eyes: Conjunctivae and EOM are normal. Pupils are equal, round, and reactive to light. No scleral icterus.  Neck: Normal  range of motion. Neck supple. No JVD present. No thyromegaly present.  Cardiovascular: Normal rate, regular rhythm and intact distal pulses.  Exam reveals no gallop and no friction rub.   Murmur (3/6 systolic) heard. Respiratory: Effort normal and breath sounds normal. No respiratory distress. She has no wheezes. She has no rales.  GI: Soft. Bowel sounds are normal. She exhibits no distension. There is no tenderness.  Musculoskeletal: Normal range of motion. She exhibits no edema.  No arthritis, no gout  Lymphadenopathy:    She has no cervical adenopathy.  Neurological: She is alert and oriented to person, place, and time. No cranial nerve deficit.  No dysarthria, no aphasia  Skin: Skin is warm and dry. No rash noted. No erythema.  Psychiatric: She has a  normal mood and affect. Her behavior is normal. Judgment and thought content normal.    LABORATORY PANEL:   CBC  Recent Labs Lab 10/14/14 0034  WBC 11.4*  HGB 10.2*  HCT 32.8*  PLT 188   ------------------------------------------------------------------------------------------------------------------  Chemistries   Recent Labs Lab 10/14/14 0034  NA 141  K 3.2*  CL 106  CO2 25  GLUCOSE 131*  BUN 13  CREATININE 0.87  CALCIUM 8.7*  AST 40  ALT 20  ALKPHOS 133*  BILITOT 0.4   ------------------------------------------------------------------------------------------------------------------  Cardiac Enzymes  Recent Labs Lab 10/14/14 0034  TROPONINI 0.04*   ------------------------------------------------------------------------------------------------------------------  RADIOLOGY:  Dg Chest Portable 1 View  10/14/2014   CLINICAL DATA:  Central chest pain radiating to the back. Left arm pain.  EXAM: PORTABLE CHEST - 1 VIEW  COMPARISON:  None.  FINDINGS: Dual lead left-sided pacemaker in place. Patient is post median sternotomy. The cardiomediastinal contours are normal. Pulmonary vasculature is normal. No  consolidation, pleural effusion, or pneumothorax. No acute osseous abnormalities are seen.  IMPRESSION: No acute pulmonary process.   Electronically Signed   By: Jeb Levering M.D.   On: 10/14/2014 03:03    EKG:  No orders found for this or any previous visit.  IMPRESSION AND PLAN:  Principal Problem:   Unstable angina - with elevated troponin, although only mildly elevated. Trend cardiac enzymes, get echocardiogram, cardiology consult. Active Problems:   Arteriosclerosis of coronary artery - continue appropriate home medications.   A-fib - patient is anticoagulated on Eliquis, continue this while here.   Essential (primary) hypertension - controlled, continue home meds.   HLD (hyperlipidemia) - continue home medications.   Adult hypothyroidism - continue home thyroid replacement.  All the records are reviewed and case discussed with ED provider. Management plans discussed with the patient and/or family.  DVT PROPHYLAXIS: Systemic anticoagulation  ADMISSION STATUS: Observation  CODE STATUS: Full Advance Directive Documentation        Most Recent Value   Type of Advance Directive  Living will, Healthcare Power of Attorney   Pre-existing out of facility DNR order (yellow form or pink MOST form)     "MOST" Form in Place?        TOTAL TIME TAKING CARE OF THIS PATIENT: 40 minutes.    Buren Havey [place] 10/14/2014, 4:13 AM  [facility]  Office  [phone]  CC: Primary care physician; Wilhemena Durie, MD

## 2014-10-14 NOTE — Progress Notes (Signed)
Communicated with Dr. Verdell Carmine r/t obtaining a prescription for amitriptyline 100mg  at bedtime

## 2014-10-14 NOTE — Progress Notes (Signed)
[facility] [place] NAME: Ruth Hodges    MR#:  [id number]  DATE OF BIRTH:  April 02, 1941  SUBJECTIVE:  CHIEF COMPLAINT:   Chief Complaint  Patient presents with  . Chest Pain    REVIEW OF SYSTEMS:  CONSTITUTIONAL: No fever, fatigue or weakness.  EYES: No blurred or double vision.  EARS, NOSE, AND THROAT: No tinnitus or ear pain.  RESPIRATORY: No cough, shortness of breath, wheezing or hemoptysis.  CARDIOVASCULAR:positive for chest pain and palpitation, No orthopnea, edema.  GASTROINTESTINAL: No nausea, vomiting, diarrhea or abdominal pain.  GENITOURINARY: No dysuria, hematuria.  ENDOCRINE: No polyuria, nocturia,  HEMATOLOGY: No anemia, easy bruising or bleeding SKIN: No rash or lesion. MUSCULOSKELETAL: No joint pain or arthritis.   NEUROLOGIC: No tingling, numbness, weakness.  PSYCHIATRY: No anxiety or depression.   ROS  DRUG ALLERGIES:   Allergies  Allergen Reactions  . Atorvastatin   . Codeine   . Ceftin  [Cefuroxime Axetil]     VITALS:  Blood pressure 117/44, pulse 65, temperature 97.8 F (36.6 C), temperature source Oral, resp. rate 17, height 5\' 3"  (1.6 m), weight 75.978 kg (167 lb 8 oz), SpO2 95 %.  PHYSICAL EXAMINATION:  GENERAL:  74 y.o.-year-old patient lying in the bed with no acute distress.  EYES: Pupils equal, round, reactive to light and accommodation. No scleral icterus. Extraocular muscles intact.  HEENT: Head atraumatic, normocephalic. Oropharynx and nasopharynx clear.  NECK:  Supple, no jugular venous distention. No thyroid enlargement, no tenderness.  LUNGS: Normal breath sounds bilaterally, no wheezing, rales,rhonchi or crepitation. No use of accessory muscles of respiration.  CARDIOVASCULAR: S1, S2 normal. positive murmurs, no rubs, or gallops.  ABDOMEN: Soft, nontender, nondistended. Bowel sounds present. No organomegaly or mass.  EXTREMITIES: No pedal edema, cyanosis, or clubbing.  NEUROLOGIC:  Cranial nerves II through XII are intact. Muscle strength 5/5 in all extremities. Sensation intact. Gait not checked.  PSYCHIATRIC: The patient is alert and oriented x 3.  SKIN: No obvious rash, lesion, or ulcer.   Physical Exam LABORATORY PANEL:   CBC  Recent Labs Lab 10/14/14 1024  WBC 8.2  HGB 10.4*  HCT 33.3*  PLT 167   ------------------------------------------------------------------------------------------------------------------  Chemistries   Recent Labs Lab 10/14/14 0034 10/14/14 1024  NA 141 144  K 3.2* 3.3*  CL 106 106  CO2 25 28  GLUCOSE 131* 96  BUN 13 14  CREATININE 0.87 0.83  CALCIUM 8.7* 9.0  AST 40  --   ALT 20  --   ALKPHOS 133*  --   BILITOT 0.4  --    ------------------------------------------------------------------------------------------------------------------  Cardiac Enzymes  Recent Labs Lab 10/14/14 0034 10/14/14 1024  TROPONINI 0.04* 0.12*   ------------------------------------------------------------------------------------------------------------------  RADIOLOGY:  Dg Chest Portable 1 View  10/14/2014   CLINICAL DATA:  Central chest pain radiating to the back. Left arm pain.  EXAM: PORTABLE CHEST - 1 VIEW  COMPARISON:  None.  FINDINGS: Dual lead left-sided pacemaker in place. Patient is post median sternotomy. The cardiomediastinal contours are normal. Pulmonary vasculature is normal. No consolidation, pleural effusion, or pneumothorax. No acute osseous abnormalities are seen.  IMPRESSION: No acute pulmonary process.   Electronically Signed   By: Jeb Levering M.D.   On: 10/14/2014 03:03    ASSESSMENT AND PLAN:   *  Unstable angina -     Likely NSTEMI-as troponin trending up.    Due to strong cardiac history- start on heparin drip.     Already  on cardiac meds.    Awaited cardio consult.   * Arteriosclerosis of coronary artery - continue appropriate home medications. * A-fib - patient is anticoagulated on Eliquis, hole  for now as on heparin drip. *  Essential (primary) hypertension - controlled, continue home meds. * HLD (hyperlipidemia) - continue home medications. * Adult hypothyroidism - continue home thyroid replacement.   All the records are reviewed and case discussed with Care Management/Social Workerr. Management plans discussed with the patient, family and they are in agreement.  CODE STATUS: full  TOTAL TIME TAKING CARE OF THIS PATIENT: 35 minutes.   More than 50% of the visit was spent in counseling/coordination of care  POSSIBLE D/C IN 1-2 DAYS, DEPENDING ON CLINICAL CONDITION.   Vaughan Basta M.D on 10/14/2014   Between 7am to 6pm - Pager - [phone]  After 6pm go to [email or web address] - password [email or web address] [facility]  Office  [phone]  CC: Primary care physician; Wilhemena Durie, MD

## 2014-10-14 NOTE — Care Management (Signed)
It is verbally reported during progression that patient is from [facility] assisted living.  Patient actually is a resident of the independent living retirement community- [facility]- since Nov 2015.  She lives alone and her husband is a resident of [facility].  Patient is independent in her adls and denies issues obtaining access to medical care and meds.  The community has a nurse available to the if needed, laundry service, house cleaning service and one hot meal a day.  Patient has not felt like she has needed any of these services yet.

## 2014-10-14 NOTE — ED Notes (Signed)
MD aware of elevated troponin

## 2014-10-14 NOTE — Progress Notes (Addendum)
ANTICOAGULATION CONSULT NOTE - Initial Consult  Pharmacy Consult for Heparin Indication: chest pain/ACS  Allergies  Allergen Reactions  . Atorvastatin   . Codeine   . Ceftin  [Cefuroxime Axetil]     Patient Measurements: Height: 5\' 3"  (160 cm) Weight: 167 lb 8 oz (75.978 kg) IBW/kg (Calculated) : 52.4 Heparin Dosing Weight: 70.2 kg  Vital Signs: Temp: 98.4 F (36.9 C) (06/29 1940) Temp Source: Oral (06/29 1940) BP: 140/61 mmHg (06/29 1940) Pulse Rate: 59 (06/29 1940)  Labs:  Recent Labs  10/14/14 0034 10/14/14 1024 10/14/14 1710  HGB 10.2* 10.4*  --   HCT 32.8* 33.3*  --   PLT 188 167  --   APTT  --   --  54*  HEPARINUNFRC  --   --  >2.20*  CREATININE 0.87 0.83  --   TROPONINI 0.04* 0.12* 0.07*    Estimated Creatinine Clearance: 56.3 mL/min (by C-G formula based on Cr of 0.83).   Medical History: Past Medical History  Diagnosis Date  . Anxiety   . Depression   . A-fib   . Arthritis   . Hypertension   . Hyperlipidemia   . Thyroid disease   . CAD (coronary artery disease)     Medications:  Scheduled:  . digoxin  0.125 mg Oral Daily  . felodipine  10 mg Oral Daily  . levothyroxine  150 mcg Oral QAC breakfast  . losartan  100 mg Oral Daily  . potassium chloride  20 mEq Oral BID  . simvastatin  40 mg Oral QPM  . sodium chloride  3 mL Intravenous Q12H   Infusions:  . heparin 850 Units/hr (10/14/14 1444)   PRN: acetaminophen **OR** acetaminophen, traMADol  Assessment: Patient admitted with CP and a h/o afib on apixaban now with elevated troponin to transition to heparin drip.   Goal of Therapy:  Heparin level 0.3-0.7 units/ml or goal APTT Monitor platelets by anticoagulation protocol: Yes   Plan:  Will order baseline aPTT and HL but will likely need to dose by aPTT as patient previously on apixaban. Will start heparin drip without bolus at 850 units/hr.   HL @ 17:10 = 2.2  HL was drawn 2 hrs after initiation of heparin drip so does not  reflect baseline.  Will draw aPTT and HL again on 6/30 @ 1:00.   Robbins,Jason D 10/14/2014,8:53 PM     6/30 01:00 aPTT 59 and anti-Xa 1.81. Increase rate to 950 units/hr and recheck aPTT and anti-Xa in 8 hours.  Sim Boast, PharmD, BCPS  10/15/2014

## 2014-10-14 NOTE — ED Notes (Signed)
Pt c/o central chest pain that radiates straight through to the back.  Pain also goes to left arm.  Took 4 NTG tonight and 325 mg ASA per pt.  Pain started while trying to sleep.  Pain is now gone

## 2014-10-14 NOTE — Progress Notes (Signed)
*  PRELIMINARY RESULTS* Echocardiogram 2D Echocardiogram has been performed.  Ruth Hodges 10/14/2014, 8:30 AM

## 2014-10-14 NOTE — Care Management (Signed)
Patient has had a bump in her troponin.  Being started on a heparin drip and cardiology consult is pending.   Has not been documented that patient has definitely having nstemi- documented as likely due to troponin trending up but not definite yet.

## 2014-10-14 NOTE — ED Notes (Signed)
Admitting MD at bedside.

## 2014-10-14 NOTE — Progress Notes (Signed)
Levester Fresh verified skin on assessment

## 2014-10-15 DIAGNOSIS — I252 Old myocardial infarction: Secondary | ICD-10-CM | POA: Diagnosis not present

## 2014-10-15 DIAGNOSIS — E785 Hyperlipidemia, unspecified: Secondary | ICD-10-CM | POA: Diagnosis present

## 2014-10-15 DIAGNOSIS — I10 Essential (primary) hypertension: Secondary | ICD-10-CM | POA: Diagnosis present

## 2014-10-15 DIAGNOSIS — E039 Hypothyroidism, unspecified: Secondary | ICD-10-CM | POA: Diagnosis present

## 2014-10-15 DIAGNOSIS — Z885 Allergy status to narcotic agent status: Secondary | ICD-10-CM | POA: Diagnosis not present

## 2014-10-15 DIAGNOSIS — F329 Major depressive disorder, single episode, unspecified: Secondary | ICD-10-CM | POA: Diagnosis present

## 2014-10-15 DIAGNOSIS — Z888 Allergy status to other drugs, medicaments and biological substances status: Secondary | ICD-10-CM | POA: Diagnosis not present

## 2014-10-15 DIAGNOSIS — F419 Anxiety disorder, unspecified: Secondary | ICD-10-CM | POA: Diagnosis present

## 2014-10-15 DIAGNOSIS — Z951 Presence of aortocoronary bypass graft: Secondary | ICD-10-CM | POA: Diagnosis not present

## 2014-10-15 DIAGNOSIS — I25110 Atherosclerotic heart disease of native coronary artery with unstable angina pectoris: Secondary | ICD-10-CM | POA: Diagnosis present

## 2014-10-15 DIAGNOSIS — I272 Other secondary pulmonary hypertension: Secondary | ICD-10-CM | POA: Diagnosis present

## 2014-10-15 DIAGNOSIS — M1990 Unspecified osteoarthritis, unspecified site: Secondary | ICD-10-CM | POA: Diagnosis present

## 2014-10-15 DIAGNOSIS — N390 Urinary tract infection, site not specified: Secondary | ICD-10-CM | POA: Diagnosis present

## 2014-10-15 DIAGNOSIS — I214 Non-ST elevation (NSTEMI) myocardial infarction: Secondary | ICD-10-CM | POA: Diagnosis present

## 2014-10-15 DIAGNOSIS — I4891 Unspecified atrial fibrillation: Secondary | ICD-10-CM | POA: Diagnosis present

## 2014-10-15 DIAGNOSIS — Z950 Presence of cardiac pacemaker: Secondary | ICD-10-CM | POA: Diagnosis not present

## 2014-10-15 DIAGNOSIS — E876 Hypokalemia: Secondary | ICD-10-CM | POA: Diagnosis present

## 2014-10-15 DIAGNOSIS — Z955 Presence of coronary angioplasty implant and graft: Secondary | ICD-10-CM | POA: Diagnosis not present

## 2014-10-15 DIAGNOSIS — Z7901 Long term (current) use of anticoagulants: Secondary | ICD-10-CM | POA: Diagnosis not present

## 2014-10-15 DIAGNOSIS — Z79899 Other long term (current) drug therapy: Secondary | ICD-10-CM | POA: Diagnosis not present

## 2014-10-15 DIAGNOSIS — Z7982 Long term (current) use of aspirin: Secondary | ICD-10-CM | POA: Diagnosis not present

## 2014-10-15 LAB — HEPARIN LEVEL (UNFRACTIONATED)
Heparin Unfractionated: 1.81 IU/mL — ABNORMAL HIGH (ref 0.30–0.70)
Heparin Unfractionated: 0.86 [IU]/mL — ABNORMAL HIGH (ref 0.30–0.70)
Heparin Unfractionated: 0.74 [IU]/mL — ABNORMAL HIGH (ref 0.30–0.70)

## 2014-10-15 LAB — CBC
HCT: 30.7 % — ABNORMAL LOW (ref 35.0–47.0)
Hemoglobin: 9.9 g/dL — ABNORMAL LOW (ref 12.0–16.0)
MCH: 27.9 pg (ref 26.0–34.0)
MCHC: 32.4 g/dL (ref 32.0–36.0)
MCV: 86.1 fL (ref 80.0–100.0)
Platelets: 146 K/uL — ABNORMAL LOW (ref 150–440)
RBC: 3.57 MIL/uL — ABNORMAL LOW (ref 3.80–5.20)
RDW: 15.3 % — ABNORMAL HIGH (ref 11.5–14.5)
WBC: 8.2 K/uL (ref 3.6–11.0)

## 2014-10-15 LAB — APTT
aPTT: 59 s — ABNORMAL HIGH (ref 24–36)
aPTT: 38 s — ABNORMAL HIGH (ref 24–36)
aPTT: 87 s — ABNORMAL HIGH (ref 24–36)

## 2014-10-15 LAB — MAGNESIUM: MAGNESIUM: 1.7 mg/dL (ref 1.7–2.4)

## 2014-10-15 MED ORDER — MAGNESIUM OXIDE 400 (241.3 MG) MG PO TABS
400.0000 mg | ORAL_TABLET | Freq: Two times a day (BID) | ORAL | Status: AC
  Administered 2014-10-15 (×2): 400 mg via ORAL
  Filled 2014-10-15 (×2): qty 1

## 2014-10-15 MED ORDER — SIMETHICONE 80 MG PO CHEW
80.0000 mg | CHEWABLE_TABLET | Freq: Four times a day (QID) | ORAL | Status: DC | PRN
  Administered 2014-10-15: 80 mg via ORAL
  Filled 2014-10-15 (×2): qty 1

## 2014-10-15 MED ORDER — CIPROFLOXACIN IN D5W 400 MG/200ML IV SOLN
400.0000 mg | Freq: Two times a day (BID) | INTRAVENOUS | Status: DC
  Administered 2014-10-15 – 2014-10-16 (×3): 400 mg via INTRAVENOUS
  Filled 2014-10-15 (×5): qty 200

## 2014-10-15 MED ORDER — METOPROLOL TARTRATE 25 MG PO TABS
12.5000 mg | ORAL_TABLET | Freq: Two times a day (BID) | ORAL | Status: DC
  Administered 2014-10-15 – 2014-10-16 (×2): 12.5 mg via ORAL
  Filled 2014-10-15 (×2): qty 1

## 2014-10-15 MED ORDER — FELODIPINE ER 5 MG PO TB24
5.0000 mg | ORAL_TABLET | Freq: Every day | ORAL | Status: DC
  Administered 2014-10-16: 5 mg via ORAL
  Filled 2014-10-15: qty 1

## 2014-10-15 NOTE — Progress Notes (Signed)
[facility] [place] NAME: Ruth Hodges    MR#:  [id number]  DATE OF BIRTH:  [birth year]  SUBJECTIVE:  CHIEF COMPLAINT:   Chief Complaint  Patient presents with  . Chest Pain  Chest pain on and off. No chest pain while examed the patient.  REVIEW OF SYSTEMS:  CONSTITUTIONAL: No fever, fatigue or weakness.  EYES: No blurred or double vision.  EARS, NOSE, AND THROAT: No tinnitus or ear pain.  RESPIRATORY: No cough, shortness of breath, wheezing or hemoptysis.  CARDIOVASCULAR:positive for chest pain and palpitation, No orthopnea, edema.  GASTROINTESTINAL: No nausea, vomiting, diarrhea or abdominal pain.  GENITOURINARY: No dysuria, hematuria.  ENDOCRINE: No polyuria, nocturia,  HEMATOLOGY: No anemia, easy bruising or bleeding SKIN: No rash or lesion. MUSCULOSKELETAL: No joint pain or arthritis.   NEUROLOGIC: No tingling, numbness, weakness.  PSYCHIATRY: No anxiety or depression.   ROS  DRUG ALLERGIES:   Allergies  Allergen Reactions  . Atorvastatin   . Codeine   . Ceftin  [Cefuroxime Axetil]     VITALS:  Blood pressure 136/38, pulse 62, temperature 98.2 F (36.8 C), temperature source Oral, resp. rate 19, height 5\' 3"  (1.6 m), weight 75.978 kg (167 lb 8 oz), SpO2 94 %.  PHYSICAL EXAMINATION:  GENERAL:  74 y.o.-year-old patient lying in the bed with no acute distress.  EYES: Pupils equal, round, reactive to light and accommodation. No scleral icterus. Extraocular muscles intact.  HEENT: Head atraumatic, normocephalic. Oropharynx and nasopharynx clear.  NECK:  Supple, no jugular venous distention. No thyroid enlargement, no tenderness.  LUNGS: Normal breath sounds bilaterally, no wheezing, rales,rhonchi or crepitation. No use of accessory muscles of respiration.  CARDIOVASCULAR: S1, S2 normal. Positive systolic murmurs 3/6, no rubs, or gallops.  ABDOMEN: Soft, nontender, nondistended. Bowel sounds present. No organomegaly  or mass.  EXTREMITIES: No pedal edema, cyanosis, or clubbing.  NEUROLOGIC: Cranial nerves II through XII are intact. Muscle strength 5/5 in all extremities. Sensation intact. Gait not checked.  PSYCHIATRIC: The patient is alert and oriented x 3.  SKIN: No obvious rash, lesion, or ulcer.   Physical Exam LABORATORY PANEL:   CBC  Recent Labs Lab 10/15/14 1306  WBC 8.2  HGB 9.9*  HCT 30.7*  PLT 146*   ------------------------------------------------------------------------------------------------------------------  Chemistries   Recent Labs Lab 10/14/14 0034 10/14/14 1024 10/15/14 1306  NA 141 144  --   K 3.2* 3.3*  --   CL 106 106  --   CO2 25 28  --   GLUCOSE 131* 96  --   BUN 13 14  --   CREATININE 0.87 0.83  --   CALCIUM 8.7* 9.0  --   MG  --   --  1.7  AST 40  --   --   ALT 20  --   --   ALKPHOS 133*  --   --   BILITOT 0.4  --   --    ------------------------------------------------------------------------------------------------------------------  Cardiac Enzymes  Recent Labs Lab 10/14/14 1024 10/14/14 1710  TROPONINI 0.12* 0.07*   ------------------------------------------------------------------------------------------------------------------  RADIOLOGY:  Dg Chest Portable 1 View  10/14/2014   CLINICAL DATA:  Central chest pain radiating to the back. Left arm pain.  EXAM: PORTABLE CHEST - 1 VIEW  COMPARISON:  None.  FINDINGS: Dual lead left-sided pacemaker in place. Patient is post median sternotomy. The cardiomediastinal contours are normal. Pulmonary vasculature is normal. No consolidation, pleural effusion, or pneumothorax. No acute osseous abnormalities are seen.  IMPRESSION: No acute pulmonary process.   Electronically Signed   By: Jeb Levering M.D.   On: 10/14/2014 03:03    ASSESSMENT AND PLAN:   *  NSTEMI, CAD.   continue heparin drip. F/u Dr. Ubaldo Glassing for possible LHC.  * A-fib - rate controlled. The patient was on Eliquis, hold for now and  heparin drip. Cont digoxin.  *  Essential (primary) hypertension - controlled, continue home meds. * HLD (hyperlipidemia) - continue home medications. * Adult hypothyroidism - continue home thyroid replacement.  * UTI. Start rocephin, f/u urine culture. * Hypokalemia and hypomagnesemia. Supplement and f/u level.   All the records are reviewed and case discussed with Care Management/Social Workerr. Management plans discussed with the patient, family and they are in agreement.  CODE STATUS: full  TOTAL TIME TAKING CARE OF THIS PATIENT: 35 minutes.   More than 50% of the visit was spent in counseling/coordination of care  POSSIBLE D/C IN 1-2 DAYS, DEPENDING ON CLINICAL CONDITION.   Demetrios Loll M.D on 10/15/2014   Between 7am to 6pm - Pager - [phone]  After 6pm go to [email or web address] - password [email or web address] [facility]  Office  [phone]  CC: Primary care physician; Wilhemena Durie, MD

## 2014-10-15 NOTE — Progress Notes (Signed)
Alert and oriented. Headache relieved by PRN medications. No needs expressed. Heparin drip adjusted today per pharmacy.  Toniann Ket

## 2014-10-15 NOTE — Care Management (Signed)
It is reported that patient most likely will have cardiac cath per cardiology.  At present do not see documentation to confirm this. MD has documented nstemi

## 2014-10-15 NOTE — Progress Notes (Signed)
ANTICOAGULATION CONSULT NOTE - Follow Up Consult  Pharmacy Consult for Heparin Indication: chest pain/ACS  Allergies  Allergen Reactions  . Atorvastatin   . Codeine   . Ceftin  [Cefuroxime Axetil]     Patient Measurements: Height: 5\' 3"  (160 cm) Weight: 167 lb 8 oz (75.978 kg) IBW/kg (Calculated) : 52.4 Heparin Dosing Weight:70.2 kg  Vital Signs: Temp: 98.2 F (36.8 C) (06/30 1131) Temp Source: Oral (06/30 1131) BP: 136/38 mmHg (06/30 1131) Pulse Rate: 62 (06/30 1131)  Labs:  Recent Labs  10/14/14 0034 10/14/14 1024 10/14/14 1710 10/15/14 0052 10/15/14 1306  HGB 10.2* 10.4*  --   --  9.9*  HCT 32.8* 33.3*  --   --  30.7*  PLT 188 167  --   --  146*  APTT  --   --  54* 59* 38*  HEPARINUNFRC  --   --  >2.20* 1.81* 0.86*  CREATININE 0.87 0.83  --   --   --   TROPONINI 0.04* 0.12* 0.07*  --   --     Estimated Creatinine Clearance: 56.3 mL/min (by C-G formula based on Cr of 0.83).   Medications:  Scheduled:  . amitriptyline  100 mg Oral QHS  . ciprofloxacin  400 mg Intravenous Q12H  . digoxin  0.125 mg Oral Daily  . felodipine  10 mg Oral Daily  . levothyroxine  150 mcg Oral QAC breakfast  . losartan  100 mg Oral Daily  . potassium chloride  20 mEq Oral BID  . simvastatin  40 mg Oral QPM  . sodium chloride  3 mL Intravenous Q12H   Infusions:  . heparin 950 Units/hr (10/15/14 0415)   PRN: acetaminophen **OR** acetaminophen, simethicone, traMADol  Assessment: Patient admitted with CP and a h/o afib on apixaban transitioned to heparin drip due to elevated troponin. APTT remains below goal after being increased to 950 units//hr. HL remains elevated.   Goal of Therapy:  Heparin level 0.3-0.7 units/ml Monitor platelets by anticoagulation protocol: Yes   Plan:  Will increase heparin infusion to 1150 units/hr and f/u APTT and HL in 6 hours.   Ulice Dash D 10/15/2014,2:03 PM

## 2014-10-15 NOTE — Progress Notes (Addendum)
ANTICOAGULATION CONSULT NOTE - Follow Up Consult  Pharmacy Consult for Heparin Indication: chest pain/ACS  Allergies  Allergen Reactions  . Atorvastatin   . Codeine   . Ceftin  [Cefuroxime Axetil]     Patient Measurements: Height: 5\' 3"  (160 cm) Weight: 167 lb 8 oz (75.978 kg) IBW/kg (Calculated) : 52.4 Heparin Dosing Weight:70.2 kg  Vital Signs: Temp: 97.7 F (36.5 C) (06/30 1955) Temp Source: Oral (06/30 1955) BP: 131/54 mmHg (06/30 1955) Pulse Rate: 59 (06/30 1955)  Labs:  Recent Labs  10/14/14 0034 10/14/14 1024  10/14/14 1710 10/15/14 0052 10/15/14 1306 10/15/14 2021  HGB 10.2* 10.4*  --   --   --  9.9*  --   HCT 32.8* 33.3*  --   --   --  30.7*  --   PLT 188 167  --   --   --  146*  --   APTT  --   --   < > 54* 59* 38* 87*  HEPARINUNFRC  --   --   < > >2.20* 1.81* 0.86* 0.74*  CREATININE 0.87 0.83  --   --   --   --   --   TROPONINI 0.04* 0.12*  --  0.07*  --   --   --   < > = values in this interval not displayed.  Estimated Creatinine Clearance: 56.3 mL/min (by C-G formula based on Cr of 0.83).   Medications:  Scheduled:  . amitriptyline  100 mg Oral QHS  . ciprofloxacin  400 mg Intravenous Q12H  . digoxin  0.125 mg Oral Daily  . [START ON 10/16/2014] felodipine  5 mg Oral Daily  . levothyroxine  150 mcg Oral QAC breakfast  . losartan  100 mg Oral Daily  . metoprolol tartrate  12.5 mg Oral BID  . potassium chloride  20 mEq Oral BID  . simvastatin  40 mg Oral QPM  . sodium chloride  3 mL Intravenous Q12H   Infusions:  . heparin 1,150 Units/hr (10/15/14 1900)   PRN: acetaminophen **OR** acetaminophen, simethicone, traMADol  Assessment: Patient admitted with CP and a h/o afib on apixaban transitioned to heparin drip due to elevated troponin.   HL remains elevated.   6/30:  APTT @ 20:30 = 87           HL @ 20:30 = 74    Goal of Therapy:  Heparin level 0.3-0.7 units/ml Monitor platelets by anticoagulation protocol: Yes   Plan:  Will  continue this pt on heparin 1150 units/hr and recheck HL and aPTT in 8 hrs on 7/1 @ 4:00.   Robbins,Jason D 10/15/2014,10:03 PM   HL in 6 hours.     7/1 05:00 anti-Xa 0.71  APTT 110. Decreased rate to 1100 units/hr and recheck anti-Xa in 6 hours. Did not order aPTT as levels seem to be correlating with anti-Xa.  Sim Boast, PharmD, BCPS  10/16/2014

## 2014-10-15 NOTE — Progress Notes (Signed)
Dr. Bridgett Larsson notified of patient's request for gasX (simethicone) for indigestion. Per MD, order pharmacy recommended dose if supplied, if not order maalox.  Per pharmacist, Melissa, simethicone dose is typically 80 mg PO up to 4 times a day.  Toniann Ket

## 2014-10-15 NOTE — Consult Note (Signed)
CARDIOLOGY CONSULT NOTE  Patient ID: Ruth Hodges MRN: [id number] DOB/AGE: 07-19-1940 74 y.o.  Admit date: 10/14/2014 Referring Physician Dr. Bridgett Larsson Primary Physician Dr. Rosanna Randy Primary Cardiologist Dr. Nehemiah Massed Reason for Consultation Chest pain  HPI: Pt is a 74 yo female with history of cad s/p cabg including a lima to the lad, svg to om1 and d1 with stents in the lad via lima and svg to [place], she also has moderate aortic stenosis with aortic valve area of 1.2 cm2 and peak gradient of 54.8 and mean gradient of 30.0 with moderate pulmonary hypertension with estimated rv systollic pressure of 25.9 mmHg. 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 She also has bilateral carotid artery disease. She has recently moved to [place]. She had evaluation in [place] in the past and was told she wasn't a candidate for further coronary intervention. Her pain was somewhat different than her angina. She was admitted with chest pain that occurred while her heart felt like it was racing. She state her pain radiated to both shoulders. EKG revealed  no ischemia. She had a trivial troponin elevation to 0.13. Pain has resolved. She has had afib with variable ventricular response. She was on Eliquis at home for her afib. She is hemodynamically stable at present.   ROS Review of Systems - History obtained from chart review and the patient General ROS: positive for  - fatigue Respiratory ROS: no cough, shortness of breath, or wheezing Cardiovascular ROS: positive for - chest pain and dyspnea on exertion Gastrointestinal ROS: no abdominal pain, change in bowel habits, or black or bloody stools Musculoskeletal ROS: negative Neurological ROS: no TIA or stroke symptoms   Past Medical History  Diagnosis Date  . Anxiety   . Depression   . A-fib   . Arthritis   . Hypertension   . Hyperlipidemia   . Thyroid disease   . CAD (coronary artery disease)     Family History  Problem Relation Age of Onset  . Heart  attack Father   . CVA Sister   . Diabetes Brother   . Heart attack Brother     History   Social History  . Marital Status: Married    Spouse Name: N/A  . Number of Children: N/A  . Years of Education: N/A   Occupational History  . Not on file.   Social History Main Topics  . Smoking status: Never Smoker   . Smokeless tobacco: Not on file  . Alcohol Use: No  . Drug Use: No  . Sexual Activity: Not on file   Other Topics Concern  . Not on file   Social History Narrative    Past Surgical History  Procedure Laterality Date  . Pacemaker insertion    . Cardiac catheterization    . Bladder surgery    . Cholecystectomy    . Appendectomy    . Coronary artery bypass graft    . Vaginal hysterectomy  1972    total, ovaries removed in 1983     Prescriptions prior to admission  Medication Sig Dispense Refill Last Dose  . amitriptyline (ELAVIL) 100 MG tablet Take by mouth at bedtime.    10/13/2014 at Unknown time  . apixaban (ELIQUIS) 5 MG TABS tablet Take by mouth 2 (two) times daily.    10/13/2014 at Unknown time  . Biotin 2500 MCG CAPS Take 5,000 mcg by mouth daily.    10/13/2014 at Unknown time  . Cholecalciferol (VITAMIN D3) 1000 UNITS CAPS Take  by mouth.   10/13/2014 at Unknown time  . cyanocobalamin 100 MCG tablet Take by mouth.   10/13/2014 at Unknown time  . digoxin (LANOXIN) 0.125 MG tablet Take 0.125 mg by mouth. Pt takes digitek   10/13/2014 at Unknown time  . felodipine (PLENDIL) 10 MG 24 hr tablet Take by mouth daily.    10/13/2014 at Unknown time  . levothyroxine (SYNTHROID, LEVOTHROID) 150 MCG tablet Take by mouth daily.    10/13/2014 at Unknown time  . losartan (COZAAR) 100 MG tablet Take 100 mg by mouth daily.    10/13/2014 at Unknown time  . Omega 3-6-9 CAPS Take by mouth.   10/13/2014 at Unknown time  . torsemide (DEMADEX) 20 MG tablet Take by mouth daily.    10/13/2014 at Unknown time  . vitamin C (ASCORBIC ACID) 250 MG tablet Take 250 mg by mouth daily.     Marland Kitchen  amiodarone (PACERONE) 200 MG tablet Take by mouth.   Taking  . CALCIUM-VITAMIN D PO Take by mouth.   Taking  . simvastatin (ZOCOR) 40 MG tablet Take by mouth every evening.    Unknown at Unknown time  . zinc gluconate 50 MG tablet Take by mouth.   Taking    Physical Exam: Blood pressure 136/38, pulse 62, temperature 98.2 F (36.8 C), temperature source Oral, resp. rate 19, height 5\' 3"  (1.6 m), weight 75.978 kg (167 lb 8 oz), SpO2 94 %.    General appearance: alert, cooperative and appears stated age Head: Normocephalic, without obvious abnormality, atraumatic Resp: clear to auscultation bilaterally Chest wall: no tenderness Cardio: regular rate and rhythm, S1, S2 normal, no murmur, click, rub or gallop GI: soft, non-tender; bowel sounds normal; no masses,  no organomegaly Extremities: extremities normal, atraumatic, no cyanosis or edema Pulses: 2+ and symmetric Neurologic: Grossly normal Labs:   Lab Results  Component Value Date   WBC 8.2 10/15/2014   HGB 9.9* 10/15/2014   HCT 30.7* 10/15/2014   MCV 86.1 10/15/2014   PLT 146* 10/15/2014    Recent Labs Lab 10/14/14 0034 10/14/14 1024  NA 141 144  K 3.2* 3.3*  CL 106 106  CO2 25 28  BUN 13 14  CREATININE 0.87 0.83  CALCIUM 8.7* 9.0  PROT 6.6  --   BILITOT 0.4  --   ALKPHOS 133*  --   ALT 20  --   AST 40  --   GLUCOSE 131* 96   Lab Results  Component Value Date   TROPONINI 0.07* 10/14/2014      Radiology: no acute cardiopulmonary process EKG: a pace v sense. No ischemia  ASSESSMENT AND PLAN:  Pt with history of multiple medical problems including cad s/p cabg time 3, pci times two of lad via lima and svg of d1, history of sss s/p ppm, afib currently in nsr with digoxin and elizus for anticoagulation who was admitted with chest pain. Has minimal troponin elevation. Pain is currently gone . Discussed invasive cardiac evaluation vs medical therapy with patient. She defers cath if at all possible due to the fact that  previous invasive evaluation in [place] felt to have limited percutaneous/surgical possibilities. Will stay off Eliquis over night and maximize antianginal therapy . If stabel, wou8ld ambulate and resume eliquis in am and consdier discharge to home.  Signed: Teodoro Spray MD, [facility] 10/15/2014, 4:39 PM

## 2014-10-16 LAB — BASIC METABOLIC PANEL
ANION GAP: 4 — AB (ref 5–15)
BUN: 9 mg/dL (ref 6–20)
CO2: 27 mmol/L (ref 22–32)
CREATININE: 0.65 mg/dL (ref 0.44–1.00)
Calcium: 9.3 mg/dL (ref 8.9–10.3)
Chloride: 109 mmol/L (ref 101–111)
GFR calc Af Amer: >60 mL/min (ref >60)
Glucose, Bld: 97 mg/dL (ref 65–99)
Potassium: 3.9 mmol/L (ref 3.5–5.1)
Sodium: 140 mmol/L (ref 135–145)

## 2014-10-16 LAB — CBC WITH DIFFERENTIAL/PLATELET
Basophils Absolute: 0.1 10*3/uL (ref 0–0.1)
Basophils Relative: 1 %
EOS ABS: 0.3 10*3/uL (ref 0–0.7)
Eosinophils Relative: 3 %
HEMATOCRIT: 30.8 % — AB (ref 35.0–47.0)
HEMOGLOBIN: 10 g/dL — AB (ref 12.0–16.0)
LYMPHS PCT: 28 %
Lymphs Abs: 2.8 10*3/uL (ref 1.0–3.6)
MCH: 28 pg (ref 26.0–34.0)
MCHC: 32.4 g/dL (ref 32.0–36.0)
MCV: 86.3 fL (ref 80.0–100.0)
MONO ABS: 0.9 10*3/uL (ref 0.2–0.9)
MONOS PCT: 9 %
NEUTROS PCT: 59 %
Neutro Abs: 5.9 10*3/uL (ref 1.4–6.5)
PLATELETS: 164 10*3/uL (ref 150–440)
RBC: 3.57 MIL/uL — AB (ref 3.80–5.20)
RDW: 15.9 % — ABNORMAL HIGH (ref 11.5–14.5)
WBC: 9.9 10*3/uL (ref 3.6–11.0)

## 2014-10-16 LAB — APTT: aPTT: 110 seconds — ABNORMAL HIGH (ref 24–36)

## 2014-10-16 LAB — MAGNESIUM: MAGNESIUM: 1.9 mg/dL (ref 1.7–2.4)

## 2014-10-16 LAB — HEPARIN LEVEL (UNFRACTIONATED): HEPARIN UNFRACTIONATED: 0.71 [IU]/mL — AB (ref 0.30–0.70)

## 2014-10-16 MED ORDER — APIXABAN 5 MG PO TABS
5.0000 mg | ORAL_TABLET | Freq: Two times a day (BID) | ORAL | Status: DC
  Administered 2014-10-16: 5 mg via ORAL
  Filled 2014-10-16: qty 1

## 2014-10-16 MED ORDER — CIPROFLOXACIN HCL 500 MG PO TABS: 500.0000 mg | ORAL_TABLET | Freq: Two times a day (BID) | ORAL | Status: DC

## 2014-10-16 NOTE — Progress Notes (Signed)
Spoke with dr. Ubaldo Glassing regarding discharged. Per cardiologist patient is okay to be discharged today. Dr. Bridgett Larsson on the floor made md aware that per cardio, patient is cleared for discharged [facility]

## 2014-10-16 NOTE — Discharge Summary (Signed)
[place] [facility] [place] NAME: Ruth Hodges    MR#:  [id number]  DATE OF BIRTH:  01-15-1941  DATE OF ADMISSION:  10/14/2014 ADMITTING PHYSICIAN: Lance Coon, MD  DATE OF DISCHARGE: 10/16/2014 12:24 PM  PRIMARY CARE PHYSICIAN: Wilhemena Durie, MD    ADMISSION DIAGNOSIS:  NSTEMI (non-ST elevated myocardial infarction) [I21.4]   DISCHARGE DIAGNOSIS:  NSTEMI (non-ST elevated myocardial infarction) [I21.4]  UTI  SECONDARY DIAGNOSIS:   Past Medical History  Diagnosis Date  . Anxiety   . Depression   . A-fib   . Arthritis   . Hypertension   . Hyperlipidemia   . Thyroid disease   . CAD (coronary artery disease)     HOSPITAL COURSE:   * NSTEMI, CAD. The patient was admitted for chest pain. Her troponin was elevated to 0.12 and she was started with heparin drip. Eliquis was discontinued. Dr. Ubaldo Glassing discussed the patient for possible cardiac catheter. The patient refused cardiac Catheter at this time. Past suggested discontinue heparin drip and resume Eliquis today. The patient has no chest pain or palpitation. The patient may be discharged to home today.  * A-fib - rate controlled. The patient was on Eliquis, which was hold and is on heparin drip. The patient will continue Eliquis and digoxin.  * Essential (primary) hypertension - controlled, continue home meds. * HLD (hyperlipidemia) - continue home medications. * Adult hypothyroidism - continue home thyroid replacement.  * UTI. She was treated with rocephin, urine culture showed gram-negative rods. She'll be given Cipro by mouth twice a day. . * Hypokalemia and hypomagnesemia. Improved after Supplement.  DISCHARGE CONDITIONS:   Stable. She will be discharged to home today.  CONSULTS OBTAINED:  Treatment Team:  Teodoro Spray, MD  DRUG ALLERGIES:   Allergies  Allergen Reactions  . Atorvastatin   . Codeine   . Ceftin  [Cefuroxime Axetil]     DISCHARGE  MEDICATIONS:   Discharge Medication List as of 10/16/2014 11:20 AM    START taking these medications   Details  ciprofloxacin (CIPRO) 500 MG tablet Take 1 tablet (500 mg total) by mouth 2 (two) times daily., Starting 10/16/2014, Until Discontinued, Print      CONTINUE these medications which have NOT CHANGED   Details  amitriptyline (ELAVIL) 100 MG tablet Take by mouth at bedtime. , Starting 08/31/2014, Until Discontinued, Historical Med    apixaban (ELIQUIS) 5 MG TABS tablet Take by mouth 2 (two) times daily. , Starting 08/31/2014, Until Discontinued, Historical Med    Biotin 2500 MCG CAPS Take 5,000 mcg by mouth daily. , Starting 08/31/2014, Until Discontinued, Historical Med    Cholecalciferol (VITAMIN D3) 1000 UNITS CAPS Take by mouth., Starting 08/31/2014, Until Discontinued, Historical Med    cyanocobalamin 100 MCG tablet Take by mouth., Starting 08/31/2014, Until Discontinued, Historical Med    digoxin (LANOXIN) 0.125 MG tablet Take 0.125 mg by mouth. Pt takes digitek, Starting 04/29/2014, Until Discontinued, Historical Med    felodipine (PLENDIL) 10 MG 24 hr tablet Take by mouth daily. , Starting 08/31/2014, Until Discontinued, Historical Med    levothyroxine (SYNTHROID, LEVOTHROID) 150 MCG tablet Take by mouth daily. , Starting 04/29/2014, Until Discontinued, Historical Med    losartan (COZAAR) 100 MG tablet Take 100 mg by mouth daily. , Starting 04/29/2014, Until Discontinued, Historical Med    Omega 3-6-9 CAPS Take by mouth., Starting 08/31/2014, Until Discontinued, Historical Med    torsemide (DEMADEX) 20 MG tablet Take by mouth daily. ,  Starting 08/31/2014, Until Discontinued, Historical Med    vitamin C (ASCORBIC ACID) 250 MG tablet Take 250 mg by mouth daily., Until Discontinued, Historical Med    amiodarone (PACERONE) 200 MG tablet Take by mouth., Starting 08/31/2014, Until Discontinued, Historical Med    CALCIUM-VITAMIN D PO Take by mouth., Starting 08/31/2014, Until  Discontinued, Historical Med    simvastatin (ZOCOR) 40 MG tablet Take by mouth every evening. , Starting 08/31/2014, Until Discontinued, Historical Med    traMADol (ULTRAM) 50 MG tablet Take 1 tablet (50 mg total) by mouth 3 (three) times daily., Starting 10/14/2014, Until Discontinued, Phone In    zinc gluconate 50 MG tablet Take by mouth., Starting 08/31/2014, Until Discontinued, Historical Med         DISCHARGE INSTRUCTIONS:    If you experience worsening of your admission symptoms, develop shortness of breath, life threatening emergency, suicidal or homicidal thoughts you must seek medical attention immediately by calling 911 or calling your MD immediately  if symptoms less severe.  You Must read complete instructions/literature along with all the possible adverse reactions/side effects for all the Medicines you take and that have been prescribed to you. Take any new Medicines after you have completely understood and accept all the possible adverse reactions/side effects.   Please note  You were cared for by a hospitalist during your hospital stay. If you have any questions about your discharge medications or the care you received while you were in the hospital after you are discharged, you can call the unit and asked to speak with the hospitalist on call if the hospitalist that took care of you is not available. Once you are discharged, your primary care physician will handle any further medical issues. Please note that NO REFILLS for any discharge medications will be authorized once you are discharged, as it is imperative that you return to your primary care physician (or establish a relationship with a primary care physician if you do not have one) for your aftercare needs so that they can reassess your need for medications and monitor your lab values.    Today   SUBJECTIVE   No complaint   VITAL SIGNS:  Blood pressure 144/49, pulse 64, temperature 98.1 F (36.7 C), temperature  source Oral, resp. rate 18, height 5\' 3"  (1.6 m), weight 78.608 kg (173 lb 4.8 oz), SpO2 91 %.  I/O:   Intake/Output Summary (Last 24 hours) at 10/16/14 1521 Last data filed at 10/16/14 1038  Gross per 24 hour  Intake 621.26 ml  Output   1350 ml  Net -728.74 ml    PHYSICAL EXAMINATION:  GENERAL:  74 y.o.-year-old patient lying in the bed with no acute distress.  EYES: Pupils equal, round, reactive to light and accommodation. No scleral icterus. Extraocular muscles intact.  HEENT: Head atraumatic, normocephalic. Oropharynx and nasopharynx clear.  NECK:  Supple, no jugular venous distention. No thyroid enlargement, no tenderness.  LUNGS: Normal breath sounds bilaterally, no wheezing, rales,rhonchi or crepitation. No use of accessory muscles of respiration.  CARDIOVASCULAR: S1, S2 normal. No murmurs, rubs, or gallops.  ABDOMEN: Soft, non-tender, non-distended. Bowel sounds present. No organomegaly or mass.  EXTREMITIES: No pedal edema, cyanosis, or clubbing.  NEUROLOGIC: Cranial nerves II through XII are intact. Muscle strength 5/5 in all extremities. Sensation intact. Gait not checked.  PSYCHIATRIC: The patient is alert and oriented x 3.  SKIN: No obvious rash, lesion, or ulcer.   DATA REVIEW:   CBC  Recent Labs Lab 10/16/14 0441  WBC  9.9  HGB 10.0*  HCT 30.8*  PLT 164    Chemistries   Recent Labs Lab 10/14/14 0034  10/16/14 0441  NA 141  < > 140  K 3.2*  < > 3.9  CL 106  < > 109  CO2 25  < > 27  GLUCOSE 131*  < > 97  BUN 13  < > 9  CREATININE 0.87  < > 0.65  CALCIUM 8.7*  < > 9.3  MG  --   < > 1.9  AST 40  --   --   ALT 20  --   --   ALKPHOS 133*  --   --   BILITOT 0.4  --   --   < > = values in this interval not displayed.  Cardiac Enzymes  Recent Labs Lab 10/14/14 1710  TROPONINI 0.07*    Microbiology Results  Results for orders placed or performed during the hospital encounter of 10/14/14  MRSA PCR Screening     Status: None   Collection Time:  10/14/14  5:25 AM  Result Value Ref Range Status   MRSA by PCR NEGATIVE NEGATIVE Final    Comment:        The GeneXpert MRSA Assay (FDA approved for NASAL specimens only), is one component of a comprehensive MRSA colonization surveillance program. It is not intended to diagnose MRSA infection nor to guide or monitor treatment for MRSA infections.   Urine culture     Status: None (Preliminary result)   Collection Time: 10/14/14 11:15 PM  Result Value Ref Range Status   Specimen Description URINE, CLEAN CATCH  Final   Special Requests NONE  Final   Culture   Final    >=100,000 COLONIES/mL GRAM NEGATIVE RODS IDENTIFICATION TO FOLLOW SUSCEPTIBILITIES TO FOLLOW    Report Status PENDING  Incomplete    RADIOLOGY:  No results found.      Management plans discussed with the patient, family and they are in agreement.  CODE STATUS:     Code Status Orders        Start     Ordered   10/14/14 0449  Full code   Continuous     10/14/14 0448    Advance Directive Documentation        Most Recent Value   Type of Advance Directive  Healthcare Power of Attorney   Pre-existing out of facility DNR order (yellow form or pink MOST form)     "MOST" Form in Place?        TOTAL TIME TAKING CARE OF THIS PATIENT: 33 minutes.    Demetrios Loll M.D on 10/16/2014 at 3:21 PM  Between 7am to 6pm - Pager - [phone]  After 6pm go to [email or web address] - password [email or web address] [facility]  Office  [phone]  CC: Primary care physician; Wilhemena Durie, MD

## 2014-10-16 NOTE — Progress Notes (Signed)
Discharge instructions along with home medication list and follow up gone over with patient. Patient verbalized that she understood instructions. One printed rx given to patient. Iv removed x2, telemetry removed. Medication received from pharmacy and given back to patient. Patient to be discharged home on ra. No distress noted. No c/o pain. Daughter to transport patient home [employer]

## 2014-10-16 NOTE — Discharge Instructions (Signed)
Low sodium and low fat diet. °Activity as tolerated. °

## 2014-10-16 NOTE — Care Management (Signed)
Important Message  Patient Details  Name: Ruth Hodges MRN: [id number] Date of Birth: [birth year]   Medicare Important Message Given:  Yes-second notification given    Ruth Hodges 10/16/2014, 10:57 AM

## 2014-10-17 LAB — URINE CULTURE

## 2014-10-22 ENCOUNTER — Other Ambulatory Visit: Payer: Self-pay | Admitting: Cardiology

## 2014-10-26 ENCOUNTER — Encounter: Payer: Self-pay | Admitting: Emergency Medicine

## 2014-10-26 ENCOUNTER — Emergency Department: Payer: Medicare Other

## 2014-10-26 ENCOUNTER — Emergency Department
Admission: EM | Admit: 2014-10-26 | Discharge: 2014-10-26 | Disposition: A | Discharge status: Home / Self Care | Payer: Medicare Other | Attending: Emergency Medicine | Admitting: Emergency Medicine

## 2014-10-26 DIAGNOSIS — R079 Chest pain, unspecified: Secondary | ICD-10-CM

## 2014-10-26 DIAGNOSIS — R0789 Other chest pain: Secondary | ICD-10-CM | POA: Diagnosis present

## 2014-10-26 DIAGNOSIS — I10 Essential (primary) hypertension: Secondary | ICD-10-CM | POA: Insufficient documentation

## 2014-10-26 LAB — TROPONIN I
TROPONIN I: 0.03 ng/mL (ref <0.031)
TROPONIN I: 0.04 ng/mL — AB (ref <0.031)
Troponin I: 0.04 ng/mL — ABNORMAL HIGH (ref <0.031)

## 2014-10-26 LAB — BASIC METABOLIC PANEL
Anion gap: 14 (ref 5–15)
BUN: 14 mg/dL (ref 6–20)
CO2: 24 mmol/L (ref 22–32)
CREATININE: 0.93 mg/dL (ref 0.44–1.00)
Calcium: 9.1 mg/dL (ref 8.9–10.3)
Chloride: 102 mmol/L (ref 101–111)
GFR calc Af Amer: >60 mL/min (ref >60)
GFR calc non Af Amer: 58 mL/min — ABNORMAL LOW (ref >60)
GLUCOSE: 122 mg/dL — AB (ref 65–99)
Potassium: 4.3 mmol/L (ref 3.5–5.1)
Sodium: 140 mmol/L (ref 135–145)

## 2014-10-26 LAB — CBC
HCT: 35.3 % (ref 35.0–47.0)
Hemoglobin: 11.3 g/dL — ABNORMAL LOW (ref 12.0–16.0)
MCH: 27.2 pg (ref 26.0–34.0)
MCHC: 31.8 g/dL — AB (ref 32.0–36.0)
MCV: 85.5 fL (ref 80.0–100.0)
PLATELETS: 205 10*3/uL (ref 150–440)
RBC: 4.13 MIL/uL (ref 3.80–5.20)
RDW: 16.1 % — ABNORMAL HIGH (ref 11.5–14.5)
WBC: 10.3 10*3/uL (ref 3.6–11.0)

## 2014-10-26 MED ORDER — TRAMADOL HCL 50 MG PO TABS
50.0000 mg | ORAL_TABLET | Freq: Once | ORAL | Status: AC
  Administered 2014-10-26: 50 mg via ORAL
  Filled 2014-10-26: qty 1

## 2014-10-26 NOTE — ED Provider Notes (Signed)
-----------------------------------------   4:50 PM on 10/26/2014 -----------------------------------------  I spoke with Dr. Ubaldo Glassing and he can see patient tomorrow at 8:45 AM to discuss if she would like to undergo a cardiac catheterization. He had decided she did not want to undergo one at last hospitalization as her doctors in [place] had told her it wouldn't change her management. However at this point she would consider it if Dr. Ubaldo Glassing thinks that it would be helpful.  Her troponins have been negative 3.  She is free of chest pain and wants to go home. She has a headache typical of headaches that she gets from time to time for which she takes tramadol and would like a dose of that prior to discharge.  Ponciano Ort, MD 10/26/14 1734

## 2014-10-26 NOTE — ED Notes (Signed)
Lab called with troponin .04.  MD notified.

## 2014-10-26 NOTE — ED Notes (Signed)
Pt comes in via EMS from home alone c/o substernal chest pain that radiates to her back and between her shoulder blades.  Describes it as burning and tightness.  H/o CABGx3, a-fib, MI, and has a pacer.  Patient currently takes eliquis.  BP in route 98/62, 98% Room air

## 2014-10-26 NOTE — Discharge Instructions (Signed)
Dr. Archie Balboa & I spoke with Dr. Ubaldo Glassing.  He would like to see you in the office tomorrow at 8:45am to discuss further if you would like to undergo a cardiac catheterization.  Return to the ER for new or worsening symptoms, difficulty breathing, or for any other concerns.  Chest Pain (Nonspecific) It is often hard to give a specific diagnosis for the cause of chest pain. There is always a chance that your pain could be related to something serious, such as a heart attack or a blood clot in the lungs. You need to follow up with your health care provider for further evaluation. CAUSES   Heartburn.  Pneumonia or bronchitis.  Anxiety or stress.  Inflammation around your heart (pericarditis) or lung (pleuritis or pleurisy).  A blood clot in the lung.  A collapsed lung (pneumothorax). It can develop suddenly on its own (spontaneous pneumothorax) or from trauma to the chest.  Shingles infection (herpes zoster virus). The chest wall is composed of bones, muscles, and cartilage. Any of these can be the source of the pain.  The bones can be bruised by injury.  The muscles or cartilage can be strained by coughing or overwork.  The cartilage can be affected by inflammation and become sore (costochondritis). DIAGNOSIS  Lab tests or other studies may be needed to find the cause of your pain. Your health care provider may have you take a test called an ambulatory electrocardiogram (ECG). An ECG records your heartbeat patterns over a 24-hour period. You may also have other tests, such as:  Transthoracic echocardiogram (TTE). During echocardiography, sound waves are used to evaluate how blood flows through your heart.  Transesophageal echocardiogram (TEE).  Cardiac monitoring. This allows your health care provider to monitor your heart rate and rhythm in real time.  Holter monitor. This is a portable device that records your heartbeat and can help diagnose heart arrhythmias. It allows your health care  provider to track your heart activity for several days, if needed.  Stress tests by exercise or by giving medicine that makes the heart beat faster. TREATMENT   Treatment depends on what may be causing your chest pain. Treatment may include:  Acid blockers for heartburn.  Anti-inflammatory medicine.  Pain medicine for inflammatory conditions.  Antibiotics if an infection is present.  You may be advised to change lifestyle habits. This includes stopping smoking and avoiding alcohol, caffeine, and chocolate.  You may be advised to keep your head raised (elevated) when sleeping. This reduces the chance of acid going backward from your stomach into your esophagus. Most of the time, nonspecific chest pain will improve within 2-3 days with rest and mild pain medicine.  HOME CARE INSTRUCTIONS   If antibiotics were prescribed, take them as directed. Finish them even if you start to feel better.  For the next few days, avoid physical activities that bring on chest pain. Continue physical activities as directed.  Do not use any tobacco products, including cigarettes, chewing tobacco, or electronic cigarettes.  Avoid drinking alcohol.  Only take medicine as directed by your health care provider.  Follow your health care provider's suggestions for further testing if your chest pain does not go away.  Keep any follow-up appointments you made. If you do not go to an appointment, you could develop lasting (chronic) problems with pain. If there is any problem keeping an appointment, call to reschedule. SEEK MEDICAL CARE IF:   Your chest pain does not go away, even after treatment.  You have  a rash with blisters on your chest.  You have a fever. SEEK IMMEDIATE MEDICAL CARE IF:   You have increased chest pain or pain that spreads to your arm, neck, jaw, back, or abdomen.  You have shortness of breath.  You have an increasing cough, or you cough up blood.  You have severe back or  abdominal pain.  You feel nauseous or vomit.  You have severe weakness.  You faint.  You have chills. This is an emergency. Do not wait to see if the pain will go away. Get medical help at once. Call your local emergency services (911 in [place] Do not drive yourself to the hospital. MAKE SURE YOU:   Understand these instructions.  Will watch your condition.  Will get help right away if you are not doing well or get worse. Document Released: 01/11/2005 Document Revised: 04/08/2013 Document Reviewed: 11/07/2007 [facility] Patient Information 2015 [place], [place]. This information is not intended to replace advice given to you by your health care provider. Make sure you discuss any questions you have with your health care provider.

## 2014-10-26 NOTE — ED Notes (Signed)
Patient is still having neck pain and a headache which is normal according to her due to her fibromyalgia.  She denies any chest pain or shortness of breath.  MD notified.

## 2014-10-26 NOTE — ED Provider Notes (Signed)
[facility] Emergency Department Provider Note   ____________________________________________  Time seen: 65  I have reviewed the triage vital signs and the nursing notes.   HISTORY  Chief Complaint Chest Pain   History limited by: Not Limited   HPI Ruth Hodges is a 74 y.o. female who presents to the emergency department today because of concern for chest pain and fast heartbeat. The patient states she was woken up from her sleep roughly 2 hours ago because of the fast heart rate. She also described chest pain is located substernally that radiated to her back, and into her neck. She denies any associated shortness of breath or diaphoresis. She was admitted to the hospital at the end of last month for similar symptoms and was diagnosed with an N STEMI.     Past Medical History  Diagnosis Date  . Anxiety   . Depression   . A-fib   . Arthritis   . Hypertension   . Hyperlipidemia   . Thyroid disease   . CAD (coronary artery disease)     Patient Active Problem List   Diagnosis Date Noted  . NSTEMI (non-ST elevated myocardial infarction) 10/15/2014  . Unstable angina 10/14/2014  . Allergic rhinitis 09/05/2014  . Arthritis 09/05/2014  . A-fib 09/05/2014  . 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 Arteriosclerosis of coronary artery 09/05/2014  . Anxiety and depression 09/05/2014  . Essential (primary) hypertension 09/05/2014  . Fibrositis 09/05/2014  . H/O acute myocardial infarction 09/05/2014  . DD (diverticular disease) 09/05/2014  . HLD (hyperlipidemia) 09/05/2014  . Adult hypothyroidism 09/05/2014  . 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 Mild cognitive disorder 09/05/2014  . Carotid arterial disease 09/05/2014  . Left ventricular hypertrophy 09/05/2014  . Aortic heart valve narrowing 09/05/2014  . Artificial cardiac pacemaker 07/21/2014    Past Surgical History  Procedure Laterality Date  . Pacemaker insertion     . Cardiac catheterization    . Bladder surgery    . Cholecystectomy    . Appendectomy    . Coronary artery bypass graft    . Vaginal hysterectomy  1972    total, ovaries removed in 1983    Current Outpatient Rx  Name  Route  Sig  Dispense  Refill  . amiodarone (PACERONE) 200 MG tablet   Oral   Take by mouth.         Marland Kitchen amitriptyline (ELAVIL) 100 MG tablet   Oral   Take by mouth at bedtime.          Marland Kitchen apixaban (ELIQUIS) 5 MG TABS tablet   Oral   Take by mouth 2 (two) times daily.          . Biotin 2500 MCG CAPS   Oral   Take 5,000 mcg by mouth daily.          Marland Kitchen CALCIUM-VITAMIN D PO   Oral   Take by mouth.         . Cholecalciferol (VITAMIN D3) 1000 UNITS CAPS   Oral   Take by mouth.         . ciprofloxacin (CIPRO) 500 MG tablet   Oral   Take 1 tablet (500 mg total) by mouth 2 (two) times daily.   10 tablet   0   . cyanocobalamin 100 MCG tablet   Oral   Take by mouth.         . digoxin (LANOXIN) 0.125 MG tablet   Oral   Take 0.125 mg  by mouth. Pt takes digitek         . felodipine (PLENDIL) 10 MG 24 hr tablet   Oral   Take by mouth daily.          Marland Kitchen levothyroxine (SYNTHROID, LEVOTHROID) 150 MCG tablet   Oral   Take by mouth daily.          Marland Kitchen losartan (COZAAR) 100 MG tablet   Oral   Take 100 mg by mouth daily.          Ernestine Conrad 3-6-9 CAPS   Oral   Take by mouth.         . simvastatin (ZOCOR) 40 MG tablet   Oral   Take by mouth every evening.          . torsemide (DEMADEX) 20 MG tablet   Oral   Take by mouth daily.          . traMADol (ULTRAM) 50 MG tablet   Oral   Take 1 tablet (50 mg total) by mouth 3 (three) times daily.   90 tablet   1   . vitamin C (ASCORBIC ACID) 250 MG tablet   Oral   Take 250 mg by mouth daily.         Marland Kitchen zinc gluconate 50 MG tablet   Oral   Take by mouth.           Allergies Atorvastatin; Codeine; and Ceftin   Family History  Problem Relation Age of Onset  . Heart  attack Father   . CVA Sister   . Diabetes Brother   . Heart attack Brother     Social History History  Substance Use Topics  . Smoking status: Never Smoker   . Smokeless tobacco: Not on file  . Alcohol Use: No    Review of Systems  Constitutional: Negative for fever. Cardiovascular: Positive for chest pain. Respiratory: Negative for shortness of breath. Gastrointestinal: Negative for abdominal pain, vomiting and diarrhea. Genitourinary: Negative for dysuria. Musculoskeletal: Negative for back pain. Skin: Negative for rash. Neurological: Negative for headaches, focal weakness or numbness.   10-point ROS otherwise negative.  ____________________________________________   PHYSICAL EXAM:  VITAL SIGNS: ED Triage Vitals  Enc Vitals Group     BP 10/26/14 0921 118/52 mmHg     Pulse Rate 10/26/14 0921 61     Resp 10/26/14 0921 28     Temp 10/26/14 0921 98 F (36.7 C)     Temp Source 10/26/14 0921 Oral     SpO2 10/26/14 0921 94 %     Weight 10/26/14 0921 163 lb (73.936 kg)     Height 10/26/14 0921 5\' 4"  (1.626 m)     Head Cir --      Peak Flow --      Pain Score 10/26/14 0922 5   Constitutional: Alert and oriented. Well appearing and in no distress. Eyes: Conjunctivae are normal. PERRL. Normal extraocular movements. ENT   Head: Normocephalic and atraumatic.   Nose: No congestion/rhinnorhea.   Mouth/Throat: Mucous membranes are moist.   Neck: No stridor. Hematological/Lymphatic/Immunilogical: No cervical lymphadenopathy. Cardiovascular: Normal rate, regular rhythm.  No murmurs, rubs, or gallops. Respiratory: Normal respiratory effort without tachypnea nor retractions. Breath sounds are clear and equal bilaterally. No wheezes/rales/rhonchi. Gastrointestinal: Soft and nontender. No distention. There is no CVA tenderness. Genitourinary: Deferred Musculoskeletal: Normal range of motion in all extremities. No joint effusions.  No lower extremity tenderness  nor edema. Neurologic:  Normal speech and language. No gross  focal neurologic deficits are appreciated. Speech is normal.  Skin:  Skin is warm, dry and intact. No rash noted. Psychiatric: Mood and affect are normal. Speech and behavior are normal. Patient exhibits appropriate insight and judgment.  ____________________________________________    LABS (pertinent positives/negatives)  Labs Reviewed  CBC - Abnormal; Notable for the following:    Hemoglobin 11.3 (*)    MCHC 31.8 (*)    RDW 16.1 (*)    All other components within normal limits  BASIC METABOLIC PANEL - Abnormal; Notable for the following:    Glucose, Bld 122 (*)    GFR calc non Af Amer 58 (*)    All other components within normal limits  TROPONIN I - Abnormal; Notable for the following:    Troponin I 0.04 (*)    All other components within normal limits  TROPONIN I  TROPONIN I     ____________________________________________   EKG  I, Nance Pear, attending physician, personally viewed and interpreted this EKG  EKG Time: 0918 Rate: 64 Rhythm: atrial paced rhythm Axis: left axis deviation Intervals: qtc 460 QRS: narrow, q waves V1, V2 ST changes: no st elevation  ____________________________________________    RADIOLOGY  CXR IMPRESSION: No acute chest findings.  ___________________________________________   PROCEDURES  Procedure(s) performed: None  Critical Care performed: No  ____________________________________________   INITIAL IMPRESSION / ASSESSMENT AND PLAN / ED COURSE  Pertinent labs & imaging results that were available during my care of the patient were reviewed by me and considered in my medical decision making (see chart for details).  Patient presents to the emergency department after an episode of fast heart rate and chest pain. First troponin was negative however second elevated at 0.04. Did discuss with Dr. Lilly Cove saw the patient during her recent hospitalization. He  thinks that patient will likely be safe for discharge. I do wonder if the slight elevation is due more to demand ischemia patient had an episode of A. fib with RVR BUT did not have any documented tachycardia. Will check a final troponin and Trop does not have significant elevation will likely be able to discharge with close cardiology follow-up.  ____________________________________________   FINAL CLINICAL IMPRESSION(S) / ED DIAGNOSES  Chest pain  Nance Pear, MD 10/26/14 1517

## 2014-10-29 ENCOUNTER — Ambulatory Visit: Payer: Self-pay | Admitting: Family Medicine

## 2014-10-30 ENCOUNTER — Inpatient Hospital Stay: Payer: Medicare Other | Admitting: Family Medicine

## 2014-11-04 ENCOUNTER — Inpatient Hospital Stay: Payer: Medicare Other | Admitting: Family Medicine

## 2014-11-09 ENCOUNTER — Ambulatory Visit (INDEPENDENT_AMBULATORY_CARE_PROVIDER_SITE_OTHER): Payer: Medicare Other | Admitting: Family Medicine

## 2014-11-09 DIAGNOSIS — H5712 Ocular pain, left eye: Secondary | ICD-10-CM | POA: Diagnosis not present

## 2014-11-09 DIAGNOSIS — R51 Headache: Secondary | ICD-10-CM

## 2014-11-09 DIAGNOSIS — Z09 Encounter for follow-up examination after completed treatment for conditions other than malignant neoplasm: Secondary | ICD-10-CM

## 2014-11-09 DIAGNOSIS — I214 Non-ST elevation (NSTEMI) myocardial infarction: Secondary | ICD-10-CM | POA: Diagnosis not present

## 2014-11-09 DIAGNOSIS — N390 Urinary tract infection, site not specified: Secondary | ICD-10-CM | POA: Diagnosis not present

## 2014-11-09 DIAGNOSIS — E876 Hypokalemia: Secondary | ICD-10-CM

## 2014-11-09 DIAGNOSIS — R519 Headache, unspecified: Secondary | ICD-10-CM

## 2014-11-09 DIAGNOSIS — M542 Cervicalgia: Secondary | ICD-10-CM | POA: Diagnosis not present

## 2014-11-09 DIAGNOSIS — R7989 Other specified abnormal findings of blood chemistry: Secondary | ICD-10-CM

## 2014-11-09 DIAGNOSIS — R778 Other specified abnormalities of plasma proteins: Secondary | ICD-10-CM

## 2014-11-09 NOTE — Progress Notes (Signed)
Patient ID: Ruth Hodges, female   DOB: 02-20-1941, 74 y.o.   MRN: [id number]   Shadia Larose  MRN: [id number] DOB: 1940/11/23  Subjective:  HPI  1. Hospital discharge follow-up Patient was hospitalized at [facility] from 10/14/14 to 10/16/14  2. MI, acute, non ST segment elevation Patient seen by Dr. Nehemiah Massed on 10/27/14  3. Elevated troponin Patient was seen in the ED on with chest pain and elevated troponin.  She was given a Heparin drip and an echo was performed.  Cardiac cath was declined by patient.  Patient was brought to the ED on 10/26/14 via EMS.  No elevation of Troponin, CXR-nl, EKG-checked.  Patient stable and discharged.  4. UTI (lower urinary tract infection) Patient was treated while in the hospital for UTI with Rocephin and given Cipro at the time of discharge.  5. Hypokalemia Patient was treated and improved while in the hospital.  6. Hypomagnesemia Patient was treated and improved while in the hospital   Patient Active Problem List   Diagnosis Date Noted  . NSTEMI (non-ST elevated myocardial infarction) 10/15/2014  . Unstable angina 10/14/2014  . Allergic rhinitis 09/05/2014  . Arthritis 09/05/2014  . A-fib 09/05/2014  . 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 Arteriosclerosis of coronary artery 09/05/2014  . Anxiety and depression 09/05/2014  . Essential (primary) hypertension 09/05/2014  . Fibrositis 09/05/2014  . H/O acute myocardial infarction 09/05/2014  . DD (diverticular disease) 09/05/2014  . HLD (hyperlipidemia) 09/05/2014  . Adult hypothyroidism 09/05/2014  . 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 Mild cognitive disorder 09/05/2014  . Carotid arterial disease 09/05/2014  . Left ventricular hypertrophy 09/05/2014  . Aortic heart valve narrowing 09/05/2014  . Artificial cardiac pacemaker 07/21/2014    Past Medical History  Diagnosis Date  . Anxiety   . Depression   . A-fib   . Arthritis   . Hypertension   . Hyperlipidemia    . Thyroid disease   . CAD (coronary artery disease)     History   Social History  . Marital Status: Married    Spouse Name: N/A  . Number of Children: N/A  . Years of Education: N/A   Occupational History  . Not on file.   Social History Main Topics  . Smoking status: Never Smoker   . Smokeless tobacco: Not on file  . Alcohol Use: No  . Drug Use: No  . Sexual Activity: Not on file   Other Topics Concern  . Not on file   Social History Narrative    Outpatient Prescriptions Prior to Visit  Medication Sig Dispense Refill  . amitriptyline (ELAVIL) 100 MG tablet Take by mouth at bedtime.     Marland Kitchen apixaban (ELIQUIS) 5 MG TABS tablet Take by mouth 2 (two) times daily.     . Biotin 2500 MCG CAPS Take 5,000 mcg by mouth daily.     Marland Kitchen CALCIUM-VITAMIN D PO Take by mouth.    . Cholecalciferol (VITAMIN D3) 1000 UNITS CAPS Take by mouth.    . cyanocobalamin 100 MCG tablet Take by mouth.    . digoxin (LANOXIN) 0.125 MG tablet Take 0.125 mg by mouth. Pt takes digitek    . felodipine (PLENDIL) 10 MG 24 hr tablet Take by mouth daily.     Marland Kitchen levothyroxine (SYNTHROID, LEVOTHROID) 150 MCG tablet Take by mouth daily.     Marland Kitchen losartan (COZAAR) 100 MG tablet Take 100 mg by mouth daily.     Ernestine Conrad 3-6-9 CAPS Take by mouth.    Marland Kitchen  simvastatin (ZOCOR) 40 MG tablet Take by mouth every evening.     . torsemide (DEMADEX) 20 MG tablet Take by mouth daily.     . traMADol (ULTRAM) 50 MG tablet Take 1 tablet (50 mg total) by mouth 3 (three) times daily. 90 tablet 1  . vitamin C (ASCORBIC ACID) 250 MG tablet Take 250 mg by mouth daily.    Marland Kitchen amiodarone (PACERONE) 200 MG tablet Take by mouth.    . ciprofloxacin (CIPRO) 500 MG tablet Take 1 tablet (500 mg total) by mouth 2 (two) times daily. 10 tablet 0  . zinc gluconate 50 MG tablet Take by mouth.     No facility-administered medications prior to visit.    Allergies  Allergen Reactions  . Atorvastatin Other (See Comments)  . Codeine   . Ceftin   [Cefuroxime Axetil] Other (See Comments)    Review of Systems  Constitutional: Positive for malaise/fatigue. Negative for fever, chills, weight loss and diaphoresis.  Eyes: Negative.   Respiratory: Negative for cough, hemoptysis, sputum production, shortness of breath and wheezing.   Cardiovascular: Negative.  Negative for chest pain, palpitations, orthopnea, claudication and leg swelling.  Musculoskeletal: Positive for neck pain.       Left leg and knee pain  Neurological: Positive for dizziness (Goes along with her headahes.) and headaches (starts in her left eye and goes around her head.  Almost daily-chronic for months.). Negative for weakness.  Psychiatric/Behavioral: Negative.    Objective:  There were no vitals taken for this visit.  Physical Exam  Constitutional: She is oriented to person, place, and time and well-developed, well-nourished, and in no distress.  HENT:  Head: Normocephalic and atraumatic.  Right Ear: External ear normal.  Left Ear: External ear normal.  Nose: Nose normal.  Mildly tender over the left temple but artery non palpable.  Eyes: Conjunctivae are normal.  Neck: Normal range of motion. Neck supple.  Cardiovascular: Normal rate, regular rhythm and normal heart sounds.   Pulmonary/Chest: Effort normal and breath sounds normal.  III/VI systolic murmur  Abdominal: Soft. Bowel sounds are normal.  Neurological: She is alert and oriented to person, place, and time. Gait normal.  Skin: Skin is warm and dry.  Psychiatric: Mood, memory, affect and judgment normal.    Assessment and Plan :   1. Hospital discharge follow-up   2. MI, acute, non ST segment elevation   3. Elevated troponin   4. UTI (lower urinary tract infection)   5. Hypokalemia  - COMPLETE METABOLIC PANEL WITH GFR  6. Hypomagnesemia  - COMPLETE METABOLIC PANEL WITH GFR  7. Eye pain, left  - Ambulatory referral to Ophthalmology  8. Neck pain Patient is already taking  Tramadol.  Safest thing for the patient at this time is low heat for comfort.  9. Acute nonintractable headache, unspecified headache type  - Sedimentation rate   [facility] 11/09/2014 12:01 PM

## 2014-11-10 ENCOUNTER — Other Ambulatory Visit: Payer: Self-pay | Admitting: Family Medicine

## 2014-11-10 ENCOUNTER — Encounter: Payer: Self-pay | Admitting: Family Medicine

## 2014-11-10 ENCOUNTER — Other Ambulatory Visit: Payer: Self-pay | Admitting: Emergency Medicine

## 2014-11-10 DIAGNOSIS — R700 Elevated erythrocyte sedimentation rate: Secondary | ICD-10-CM

## 2014-11-10 LAB — COMPREHENSIVE METABOLIC PANEL
A/G RATIO: 1.4 (ref 1.1–2.5)
ALBUMIN: 4.2 g/dL (ref 3.5–4.8)
ALT: 21 IU/L (ref 0–32)
AST: 31 IU/L (ref 0–40)
Alkaline Phosphatase: 146 IU/L — ABNORMAL HIGH (ref 39–117)
BUN/Creatinine Ratio: 16 (ref 11–26)
BUN: 16 mg/dL (ref 8–27)
Bilirubin Total: 0.4 mg/dL (ref 0.0–1.2)
CHLORIDE: 97 mmol/L (ref 97–108)
CO2: 23 mmol/L (ref 18–29)
CREATININE: 1.02 mg/dL — AB (ref 0.57–1.00)
Calcium: 9.8 mg/dL (ref 8.7–10.3)
GFR calc Af Amer: 62 mL/min/{1.73_m2} (ref >59)
GFR calc non Af Amer: 54 mL/min/{1.73_m2} — ABNORMAL LOW (ref >59)
GLOBULIN, TOTAL: 3 g/dL (ref 1.5–4.5)
Glucose: 107 mg/dL — ABNORMAL HIGH (ref 65–99)
POTASSIUM: 4.8 mmol/L (ref 3.5–5.2)
Sodium: 140 mmol/L (ref 134–144)
Total Protein: 7.2 g/dL (ref 6.0–8.5)

## 2014-11-10 LAB — SEDIMENTATION RATE: SED RATE: 56 mm/h — AB (ref 0–40)

## 2014-11-11 NOTE — Telephone Encounter (Signed)
Do you want to ok this Rx ED

## 2014-11-12 ENCOUNTER — Other Ambulatory Visit: Payer: Self-pay

## 2014-11-12 ENCOUNTER — Telehealth: Payer: Self-pay

## 2014-11-12 NOTE — Telephone Encounter (Signed)
Called pt to pick up prescription. Pt requested for prescription to be called it in to pharmacy. Called pharmacy for prescription.

## 2014-11-12 NOTE — Telephone Encounter (Signed)
OK to call in rx. Thanks.

## 2014-11-12 NOTE — Telephone Encounter (Signed)
This is a pt of Dr Rosanna Randy.  Pt contacted office for refill request on the following medications:  traMADol (ULTRAM) 50 MG.  [place].  CB#[phone]/MW  Pt states she is out and will need to get this today if possible/MW

## 2014-11-18 ENCOUNTER — Ambulatory Visit (INDEPENDENT_AMBULATORY_CARE_PROVIDER_SITE_OTHER): Payer: Medicare Other | Admitting: Family Medicine

## 2014-11-18 ENCOUNTER — Encounter: Payer: Self-pay | Admitting: Family Medicine

## 2014-11-18 VITALS — BP 110/52 | HR 62 | Temp 98.1°F | Resp 16 | Ht 65.0 in | Wt 168.0 lb

## 2014-11-18 DIAGNOSIS — L259 Unspecified contact dermatitis, unspecified cause: Secondary | ICD-10-CM | POA: Diagnosis not present

## 2014-11-18 MED ORDER — FLUOCINONIDE 0.05 % EX CREA: 1.0000 "application " | TOPICAL_CREAM | Freq: Three times a day (TID) | CUTANEOUS | Status: AC

## 2014-11-18 NOTE — Progress Notes (Signed)
Subjective:     Patient ID: Ruth Hodges, female   DOB: Sep 11, 1940, 74 y.o.   MRN: [id number]  HPI  Chief Complaint  Patient presents with  . Skin Problem    Patient comes in office today with concerns of allergic reaction. Patient purchased Maxblock Sunscreen from [employer] yesterday and applied on skin she states that a matter of a few hrs she broke out in rash on her upper extremity.   States she did not expose herself to the sun. Reports the sun screen was a 50 instead of the 30 she usually uses. Has used hydrocortisone cream and oral Benadryl for her sx.   Review of Systems  Respiratory: Negative for shortness of breath.        Objective:   Physical Exam  Constitutional: She appears well-developed and well-nourished. No distress.  Skin:  Discrete slightly raised papules on extremities, upper chest, neck and face where cream applied.       Assessment:    1. Contact dermatitis - fluocinonide cream (LIDEX) 0.05 %; Apply 1 application topically 3 (three) times daily. To rash except face  Dispense: 15 g; Refill: 0    Plan:    May add Claritin. Call in 24 hours if not improving.

## 2014-11-18 NOTE — Patient Instructions (Addendum)
Continue Benadryl and add Claritin. Continue hydrocortisone for the facial rash. Call me tomorrow if not improved.

## 2014-11-20 ENCOUNTER — Other Ambulatory Visit: Payer: Self-pay | Admitting: Family Medicine

## 2014-11-20 ENCOUNTER — Telehealth: Payer: Self-pay | Admitting: Family Medicine

## 2014-11-20 ENCOUNTER — Ambulatory Visit: Payer: Medicare Other | Admitting: Family Medicine

## 2014-11-20 DIAGNOSIS — L259 Unspecified contact dermatitis, unspecified cause: Secondary | ICD-10-CM

## 2014-11-20 MED ORDER — PREDNISONE 10 MG PO TABS: ORAL_TABLET | ORAL | Status: DC

## 2014-11-20 NOTE — Telephone Encounter (Signed)
States her rash is not responding to topical steroids and oral antihistamines.

## 2014-11-23 ENCOUNTER — Inpatient Hospital Stay
Admission: EM | Admit: 2014-11-23 | Discharge: 2014-12-01 | DRG: 286 | Disposition: A | Discharge status: Home Health | Payer: Medicare Other | Attending: Internal Medicine | Admitting: Internal Medicine

## 2014-11-23 ENCOUNTER — Inpatient Hospital Stay: Payer: Medicare Other

## 2014-11-23 ENCOUNTER — Encounter: Payer: Self-pay | Admitting: Medical Oncology

## 2014-11-23 ENCOUNTER — Ambulatory Visit: Admit: 2014-11-23 | Payer: Self-pay | Admitting: Cardiovascular Disease

## 2014-11-23 ENCOUNTER — Emergency Department: Payer: Medicare Other

## 2014-11-23 ENCOUNTER — Encounter
Admission: EM | Disposition: A | Discharge status: Home Health | Payer: Self-pay | Source: Home / Self Care | Attending: Internal Medicine

## 2014-11-23 DIAGNOSIS — Z7901 Long term (current) use of anticoagulants: Secondary | ICD-10-CM | POA: Diagnosis not present

## 2014-11-23 DIAGNOSIS — Z8249 Family history of ischemic heart disease and other diseases of the circulatory system: Secondary | ICD-10-CM

## 2014-11-23 DIAGNOSIS — R0600 Dyspnea, unspecified: Secondary | ICD-10-CM | POA: Diagnosis not present

## 2014-11-23 DIAGNOSIS — E039 Hypothyroidism, unspecified: Secondary | ICD-10-CM | POA: Diagnosis not present

## 2014-11-23 DIAGNOSIS — I214 Non-ST elevation (NSTEMI) myocardial infarction: Secondary | ICD-10-CM

## 2014-11-23 DIAGNOSIS — Z833 Family history of diabetes mellitus: Secondary | ICD-10-CM | POA: Diagnosis not present

## 2014-11-23 DIAGNOSIS — M1990 Unspecified osteoarthritis, unspecified site: Secondary | ICD-10-CM | POA: Diagnosis present

## 2014-11-23 DIAGNOSIS — I481 Persistent atrial fibrillation: Secondary | ICD-10-CM | POA: Diagnosis not present

## 2014-11-23 DIAGNOSIS — J9601 Acute respiratory failure with hypoxia: Secondary | ICD-10-CM | POA: Diagnosis not present

## 2014-11-23 DIAGNOSIS — I480 Paroxysmal atrial fibrillation: Principal | ICD-10-CM | POA: Diagnosis present

## 2014-11-23 DIAGNOSIS — Z951 Presence of aortocoronary bypass graft: Secondary | ICD-10-CM | POA: Diagnosis not present

## 2014-11-23 DIAGNOSIS — Z79899 Other long term (current) drug therapy: Secondary | ICD-10-CM

## 2014-11-23 DIAGNOSIS — I4891 Unspecified atrial fibrillation: Secondary | ICD-10-CM

## 2014-11-23 DIAGNOSIS — I5043 Acute on chronic combined systolic (congestive) and diastolic (congestive) heart failure: Secondary | ICD-10-CM | POA: Diagnosis not present

## 2014-11-23 DIAGNOSIS — Z823 Family history of stroke: Secondary | ICD-10-CM | POA: Diagnosis not present

## 2014-11-23 DIAGNOSIS — Z7902 Long term (current) use of antithrombotics/antiplatelets: Secondary | ICD-10-CM | POA: Diagnosis not present

## 2014-11-23 DIAGNOSIS — I5022 Chronic systolic (congestive) heart failure: Secondary | ICD-10-CM | POA: Diagnosis not present

## 2014-11-23 DIAGNOSIS — I482 Chronic atrial fibrillation: Secondary | ICD-10-CM | POA: Diagnosis not present

## 2014-11-23 DIAGNOSIS — J323 Chronic sphenoidal sinusitis: Secondary | ICD-10-CM | POA: Diagnosis not present

## 2014-11-23 DIAGNOSIS — I495 Sick sinus syndrome: Secondary | ICD-10-CM | POA: Diagnosis present

## 2014-11-23 DIAGNOSIS — R7881 Bacteremia: Secondary | ICD-10-CM | POA: Diagnosis present

## 2014-11-23 DIAGNOSIS — S0990XA Unspecified injury of head, initial encounter: Secondary | ICD-10-CM | POA: Diagnosis not present

## 2014-11-23 DIAGNOSIS — I10 Essential (primary) hypertension: Secondary | ICD-10-CM | POA: Diagnosis present

## 2014-11-23 DIAGNOSIS — B954 Other streptococcus as the cause of diseases classified elsewhere: Secondary | ICD-10-CM | POA: Diagnosis present

## 2014-11-23 DIAGNOSIS — I2510 Atherosclerotic heart disease of native coronary artery without angina pectoris: Secondary | ICD-10-CM | POA: Diagnosis present

## 2014-11-23 DIAGNOSIS — I209 Angina pectoris, unspecified: Secondary | ICD-10-CM

## 2014-11-23 DIAGNOSIS — K5900 Constipation, unspecified: Secondary | ICD-10-CM | POA: Diagnosis present

## 2014-11-23 DIAGNOSIS — R0902 Hypoxemia: Secondary | ICD-10-CM | POA: Diagnosis present

## 2014-11-23 DIAGNOSIS — R918 Other nonspecific abnormal finding of lung field: Secondary | ICD-10-CM | POA: Diagnosis not present

## 2014-11-23 DIAGNOSIS — Z950 Presence of cardiac pacemaker: Secondary | ICD-10-CM

## 2014-11-23 DIAGNOSIS — E785 Hyperlipidemia, unspecified: Secondary | ICD-10-CM | POA: Diagnosis present

## 2014-11-23 DIAGNOSIS — S0101XA Laceration without foreign body of scalp, initial encounter: Secondary | ICD-10-CM | POA: Diagnosis not present

## 2014-11-23 DIAGNOSIS — R748 Abnormal levels of other serum enzymes: Secondary | ICD-10-CM | POA: Diagnosis not present

## 2014-11-23 DIAGNOSIS — R2681 Unsteadiness on feet: Secondary | ICD-10-CM | POA: Diagnosis not present

## 2014-11-23 DIAGNOSIS — W19XXXA Unspecified fall, initial encounter: Secondary | ICD-10-CM | POA: Diagnosis not present

## 2014-11-23 DIAGNOSIS — K001 Supernumerary teeth: Secondary | ICD-10-CM | POA: Diagnosis not present

## 2014-11-23 DIAGNOSIS — R42 Dizziness and giddiness: Secondary | ICD-10-CM | POA: Diagnosis present

## 2014-11-23 DIAGNOSIS — R7989 Other specified abnormal findings of blood chemistry: Secondary | ICD-10-CM | POA: Diagnosis present

## 2014-11-23 DIAGNOSIS — D72829 Elevated white blood cell count, unspecified: Secondary | ICD-10-CM | POA: Diagnosis not present

## 2014-11-23 DIAGNOSIS — F329 Major depressive disorder, single episode, unspecified: Secondary | ICD-10-CM | POA: Diagnosis present

## 2014-11-23 DIAGNOSIS — R778 Other specified abnormalities of plasma proteins: Secondary | ICD-10-CM | POA: Diagnosis present

## 2014-11-23 DIAGNOSIS — I7100 Dissection of unspecified site of aorta: Secondary | ICD-10-CM

## 2014-11-23 DIAGNOSIS — F419 Anxiety disorder, unspecified: Secondary | ICD-10-CM | POA: Diagnosis present

## 2014-11-23 DIAGNOSIS — A491 Streptococcal infection, unspecified site: Secondary | ICD-10-CM | POA: Diagnosis present

## 2014-11-23 DIAGNOSIS — N179 Acute kidney failure, unspecified: Secondary | ICD-10-CM | POA: Diagnosis not present

## 2014-11-23 DIAGNOSIS — R339 Retention of urine, unspecified: Secondary | ICD-10-CM | POA: Diagnosis not present

## 2014-11-23 DIAGNOSIS — R51 Headache: Secondary | ICD-10-CM | POA: Diagnosis not present

## 2014-11-23 DIAGNOSIS — I509 Heart failure, unspecified: Secondary | ICD-10-CM

## 2014-11-23 DIAGNOSIS — R0789 Other chest pain: Secondary | ICD-10-CM

## 2014-11-23 DIAGNOSIS — M8580 Other specified disorders of bone density and structure, unspecified site: Secondary | ICD-10-CM | POA: Diagnosis not present

## 2014-11-23 DIAGNOSIS — S0191XA Laceration without foreign body of unspecified part of head, initial encounter: Secondary | ICD-10-CM | POA: Diagnosis not present

## 2014-11-23 DIAGNOSIS — Z452 Encounter for adjustment and management of vascular access device: Secondary | ICD-10-CM

## 2014-11-23 LAB — BASIC METABOLIC PANEL
Anion gap: 15 (ref 5–15)
BUN: 21 mg/dL — ABNORMAL HIGH (ref 6–20)
CHLORIDE: 102 mmol/L (ref 101–111)
CO2: 18 mmol/L — ABNORMAL LOW (ref 22–32)
Calcium: 8.2 mg/dL — ABNORMAL LOW (ref 8.9–10.3)
Creatinine, Ser: 1.19 mg/dL — ABNORMAL HIGH (ref 0.44–1.00)
GFR calc Af Amer: 50 mL/min — ABNORMAL LOW (ref >60)
GFR, EST NON AFRICAN AMERICAN: 43 mL/min — AB (ref >60)
Glucose, Bld: 91 mg/dL (ref 65–99)
POTASSIUM: 3.2 mmol/L — AB (ref 3.5–5.1)
Sodium: 135 mmol/L (ref 135–145)

## 2014-11-23 LAB — TROPONIN I
TROPONIN I: 0.07 ng/mL — AB (ref <0.031)
Troponin I: 0.84 ng/mL — ABNORMAL HIGH (ref <0.031)
Troponin I: 6.68 ng/mL — ABNORMAL HIGH (ref <0.031)

## 2014-11-23 LAB — CBC WITH DIFFERENTIAL/PLATELET
Basophils Absolute: 0 K/uL (ref 0–0.1)
Basophils Relative: 0 %
Eosinophils Absolute: 0 K/uL (ref 0–0.7)
Eosinophils Relative: 0 %
HCT: 32.4 % — ABNORMAL LOW (ref 35.0–47.0)
Hemoglobin: 10.1 g/dL — ABNORMAL LOW (ref 12.0–16.0)
Lymphocytes Relative: 7 %
Lymphs Abs: 2.1 K/uL (ref 1.0–3.6)
MCH: 26.4 pg (ref 26.0–34.0)
MCHC: 31.2 g/dL — ABNORMAL LOW (ref 32.0–36.0)
MCV: 84.8 fL (ref 80.0–100.0)
Monocytes Absolute: 3.2 K/uL — ABNORMAL HIGH (ref 0.2–0.9)
Monocytes Relative: 11 %
Neutro Abs: 24.1 K/uL — ABNORMAL HIGH (ref 1.4–6.5)
Neutrophils Relative %: 82 %
Platelets: 240 K/uL (ref 150–440)
RBC: 3.82 MIL/uL (ref 3.80–5.20)
RDW: 16.6 % — ABNORMAL HIGH (ref 11.5–14.5)
WBC: 29.4 K/uL — ABNORMAL HIGH (ref 3.6–11.0)

## 2014-11-23 LAB — PROTIME-INR
INR: 1.27
PROTHROMBIN TIME: 16.1 s — AB (ref 11.4–15.0)

## 2014-11-23 LAB — APTT: aPTT: 29 s (ref 24–36)

## 2014-11-23 LAB — GLUCOSE, CAPILLARY: GLUCOSE-CAPILLARY: 109 mg/dL — AB (ref 65–99)

## 2014-11-23 LAB — MRSA PCR SCREENING: MRSA by PCR: NEGATIVE

## 2014-11-23 SURGERY — LEFT HEART CATH
Anesthesia: Moderate Sedation

## 2014-11-23 MED ORDER — ISOSORBIDE MONONITRATE ER 30 MG PO TB24
30.0000 mg | ORAL_TABLET | Freq: Every day | ORAL | Status: DC
  Administered 2014-11-24 – 2014-12-01 (×7): 30 mg via ORAL
  Filled 2014-11-23 (×7): qty 1

## 2014-11-23 MED ORDER — SODIUM CHLORIDE 0.9 % IJ SOLN
10.0000 mL | Freq: Two times a day (BID) | INTRAMUSCULAR | Status: DC
  Administered 2014-11-23 – 2014-12-01 (×11): 10 mL

## 2014-11-23 MED ORDER — FENTANYL CITRATE (PF) 100 MCG/2ML IJ SOLN
INTRAMUSCULAR | Status: AC
  Filled 2014-11-23: qty 2

## 2014-11-23 MED ORDER — LEVOTHYROXINE SODIUM 75 MCG PO TABS
150.0000 ug | ORAL_TABLET | Freq: Every day | ORAL | Status: DC
  Administered 2014-11-24 – 2014-11-30 (×6): 150 ug via ORAL
  Filled 2014-11-23 (×3): qty 1
  Filled 2014-11-23: qty 2
  Filled 2014-11-23 (×2): qty 1

## 2014-11-23 MED ORDER — METOPROLOL TARTRATE 1 MG/ML IV SOLN
2.5000 mg | Freq: Once | INTRAVENOUS | Status: AC
  Administered 2014-11-23: 2.5 mg via INTRAVENOUS
  Filled 2014-11-23: qty 5

## 2014-11-23 MED ORDER — ALUM & MAG HYDROXIDE-SIMETH 200-200-20 MG/5ML PO SUSP
30.0000 mL | Freq: Four times a day (QID) | ORAL | Status: DC | PRN
  Administered 2014-11-28 (×2): 30 mL via ORAL
  Filled 2014-11-23 (×2): qty 30

## 2014-11-23 MED ORDER — ACETAMINOPHEN 650 MG RE SUPP: 650.0000 mg | Freq: Four times a day (QID) | RECTAL | Status: DC | PRN

## 2014-11-23 MED ORDER — SODIUM CHLORIDE 0.9 % IV BOLUS (SEPSIS)
500.0000 mL | Freq: Once | INTRAVENOUS | Status: AC
  Administered 2014-11-23: 500 mL via INTRAVENOUS

## 2014-11-23 MED ORDER — LORAZEPAM 2 MG/ML IJ SOLN
0.5000 mg | Freq: Once | INTRAMUSCULAR | Status: AC
  Administered 2014-11-24: 0.5 mg via INTRAVENOUS
  Filled 2014-11-23: qty 1

## 2014-11-23 MED ORDER — FENTANYL CITRATE (PF) 100 MCG/2ML IJ SOLN: 25.0000 ug | Freq: Once | INTRAMUSCULAR | Status: DC

## 2014-11-23 MED ORDER — DEXTROSE 5 % IV SOLN: 60.0000 mg/h | Freq: Once | INTRAVENOUS | Status: DC

## 2014-11-23 MED ORDER — APIXABAN 5 MG PO TABS
5.0000 mg | ORAL_TABLET | Freq: Two times a day (BID) | ORAL | Status: DC
  Administered 2014-11-23: 5 mg via ORAL
  Filled 2014-11-23 (×2): qty 1

## 2014-11-23 MED ORDER — SODIUM CHLORIDE 0.9 % IV SOLN
INTRAVENOUS | Status: DC
  Administered 2014-11-23 – 2014-11-25 (×3): via INTRAVENOUS

## 2014-11-23 MED ORDER — AMIODARONE HCL IN DEXTROSE 360-4.14 MG/200ML-% IV SOLN
60.0000 mg/h | INTRAVENOUS | Status: AC
  Administered 2014-11-23: 60 mg/h via INTRAVENOUS
  Administered 2014-11-23: 200 mL via INTRAVENOUS
  Filled 2014-11-23: qty 200

## 2014-11-23 MED ORDER — AMIODARONE HCL IN DEXTROSE 360-4.14 MG/200ML-% IV SOLN
INTRAVENOUS | Status: AC
  Administered 2014-11-23: 200 mL via INTRAVENOUS
  Filled 2014-11-23: qty 200

## 2014-11-23 MED ORDER — ACETAMINOPHEN 325 MG PO TABS
650.0000 mg | ORAL_TABLET | Freq: Four times a day (QID) | ORAL | Status: DC | PRN
  Administered 2014-11-24 – 2014-11-27 (×5): 650 mg via ORAL
  Filled 2014-11-23 (×5): qty 2

## 2014-11-23 MED ORDER — MORPHINE SULFATE 2 MG/ML IJ SOLN
1.0000 mg | INTRAMUSCULAR | Status: DC | PRN
  Administered 2014-11-23 – 2014-11-25 (×6): 1 mg via INTRAVENOUS
  Filled 2014-11-23 (×6): qty 1

## 2014-11-23 MED ORDER — ONDANSETRON HCL 4 MG PO TABS
4.0000 mg | ORAL_TABLET | Freq: Four times a day (QID) | ORAL | Status: DC | PRN
  Administered 2014-12-01: 4 mg via ORAL
  Filled 2014-11-23: qty 1

## 2014-11-23 MED ORDER — SENNOSIDES-DOCUSATE SODIUM 8.6-50 MG PO TABS
1.0000 | ORAL_TABLET | Freq: Every evening | ORAL | Status: DC | PRN
Start: 2014-11-23 — End: 2014-12-01
  Administered 2014-11-29: 1 via ORAL
  Filled 2014-11-23 (×2): qty 1

## 2014-11-23 MED ORDER — SODIUM CHLORIDE 0.9 % IJ SOLN: 10.0000 mL | INTRAMUSCULAR | Status: DC | PRN

## 2014-11-23 MED ORDER — IOHEXOL 350 MG/ML SOLN
100.0000 mL | Freq: Once | INTRAVENOUS | Status: AC | PRN
  Administered 2014-11-23: 100 mL via INTRAVENOUS

## 2014-11-23 MED ORDER — TRAMADOL HCL 50 MG PO TABS
50.0000 mg | ORAL_TABLET | Freq: Three times a day (TID) | ORAL | Status: DC
  Administered 2014-11-23 – 2014-12-01 (×22): 50 mg via ORAL
  Filled 2014-11-23 (×23): qty 1

## 2014-11-23 MED ORDER — VITAMIN C 500 MG PO TABS
250.0000 mg | ORAL_TABLET | Freq: Every day | ORAL | Status: DC
  Administered 2014-11-23 – 2014-12-01 (×8): 250 mg via ORAL
  Filled 2014-11-23 (×7): qty 1
  Filled 2014-11-23: qty 2

## 2014-11-23 MED ORDER — DIGOXIN 125 MCG PO TABS
0.1250 mg | ORAL_TABLET | Freq: Every day | ORAL | Status: DC
  Administered 2014-11-25 – 2014-11-26 (×2): 0.125 mg via ORAL
  Filled 2014-11-23 (×3): qty 1

## 2014-11-23 MED ORDER — NITROGLYCERIN 0.4 MG SL SUBL
0.4000 mg | SUBLINGUAL_TABLET | SUBLINGUAL | Status: DC | PRN
  Administered 2014-11-23 – 2014-11-25 (×6): 0.4 mg via SUBLINGUAL
  Filled 2014-11-23 (×7): qty 1

## 2014-11-23 MED ORDER — ONDANSETRON HCL 4 MG/2ML IJ SOLN: 4.0000 mg | Freq: Four times a day (QID) | INTRAMUSCULAR | Status: DC | PRN

## 2014-11-23 MED ORDER — SIMVASTATIN 40 MG PO TABS
40.0000 mg | ORAL_TABLET | Freq: Every evening | ORAL | Status: DC
  Administered 2014-11-23 – 2014-11-24 (×2): 40 mg via ORAL
  Filled 2014-11-23 (×2): qty 1

## 2014-11-23 MED ORDER — AMIODARONE HCL IN DEXTROSE 360-4.14 MG/200ML-% IV SOLN
30.0000 mg/h | INTRAVENOUS | Status: DC
  Administered 2014-11-23 – 2014-11-24 (×2): 30 mg/h via INTRAVENOUS
  Filled 2014-11-23 (×6): qty 200

## 2014-11-23 MED ORDER — AMITRIPTYLINE HCL 25 MG PO TABS
100.0000 mg | ORAL_TABLET | Freq: Every day | ORAL | Status: DC
  Administered 2014-11-23 – 2014-11-30 (×7): 100 mg via ORAL
  Filled 2014-11-23 (×8): qty 4

## 2014-11-23 MED ORDER — PREDNISONE 20 MG PO TABS
50.0000 mg | ORAL_TABLET | Freq: Every day | ORAL | Status: DC
  Administered 2014-11-24: 50 mg via ORAL
  Filled 2014-11-23: qty 2
  Filled 2014-11-23: qty 1
  Filled 2014-11-23: qty 3

## 2014-11-23 NOTE — H&P (Signed)
[facility] [place] NAME: Ruth Hodges    MR#:  [id number]  DATE OF BIRTH:  1940/09/30  DATE OF ADMISSION:  11/23/2014  PRIMARY CARE PHYSICIAN: Wilhemena Durie, MD   REQUESTING/REFERRING PHYSICIAN: Dr. Joni Fears  CHIEF COMPLAINT:  Palpitations and chest pain HISTORY OF PRESENT ILLNESS:  Ruth Hodges  is a 74 y.o. female with a known history of sick sinus syndrome status post pacemaker and atrial fibrillation on anticoagulation who presents with above complaint. Patient woke up this morning with palpitations and chest pain radiating to her neck. She continues to have chest pain and palpitations. In the emergency room she was noted to have atrial fibrillation and RVR heart rates were in the 150s. She was started on amiodarone drip due to low blood pressure she has now converted to normal sinus rhythm. She was seen and evaluated by cardiology while in the emergency room. She had a recent echocardiogram which showed normal ejection fraction with some mild to moderate valvular heart disease.  PAST MEDICAL HISTORY:   Past Medical History  Diagnosis Date  . Anxiety   . Depression   . A-fib   . Arthritis   . Hypertension   . Hyperlipidemia   . Thyroid disease   . CAD (coronary artery disease)     PAST SURGICAL HISTORY:   Past Surgical History  Procedure Laterality Date  . Pacemaker insertion    . Cardiac catheterization    . Bladder surgery    . Cholecystectomy    . Appendectomy    . Coronary artery bypass graft    . Vaginal hysterectomy  1972    total, ovaries removed in 1983    SOCIAL HISTORY:   History  Substance Use Topics  . Smoking status: Never Smoker   . Smokeless tobacco: Not on file  . Alcohol Use: No    FAMILY HISTORY:   Family History  Problem Relation Age of Onset  . Heart attack Father   . CVA Sister   . Diabetes Brother   . Heart attack Brother     DRUG ALLERGIES:   Allergies  Allergen  Reactions  . Atorvastatin Other (See Comments)  . Codeine   . Ceftin  [Cefuroxime Axetil] Other (See Comments)     REVIEW OF SYSTEMS:  CONSTITUTIONAL: No fever, fatigue or weakness.  EYES: No blurred or double vision.  EARS, NOSE, AND THROAT: No tinnitus or ear pain.  RESPIRATORY: No cough, positive occasional shortness of breath, no wheezing or hemoptysis.  CARDIOVASCULAR: Positive chest pain, no orthopnea or edema.  GASTROINTESTINAL: No nausea, vomiting, diarrhea or abdominal pain.  GENITOURINARY: No dysuria, hematuria.  ENDOCRINE: No polyuria, nocturia,  HEMATOLOGY: No anemia, positive easy bruising no bleeding SKIN: No rash or lesion. MUSCULOSKELETAL: No joint pain or arthritis.   NEUROLOGIC: No tingling, numbness, weakness.  PSYCHIATRY: Positive anxiety and depression.   MEDICATIONS AT HOME:   Prior to Admission medications   Medication Sig Start Date End Date Taking? Authorizing Provider  amitriptyline (ELAVIL) 100 MG tablet Take by mouth at bedtime.  08/31/14   Historical Provider, MD  apixaban (ELIQUIS) 5 MG TABS tablet Take by mouth 2 (two) times daily.  08/31/14   Historical Provider, MD  Biotin 2500 MCG CAPS Take 5,000 mcg by mouth daily.  08/31/14   Historical Provider, MD  CALCIUM-VITAMIN D PO Take by mouth. 08/31/14   Historical Provider, MD  Cholecalciferol (VITAMIN D3) 1000 UNITS CAPS Take by mouth. 08/31/14  Historical Provider, MD  cyanocobalamin 100 MCG tablet Take by mouth. 08/31/14   Historical Provider, MD  digoxin (LANOXIN) 0.125 MG tablet Take 0.125 mg by mouth. Pt takes digitek 04/29/14   Historical Provider, MD  felodipine (PLENDIL) 10 MG 24 hr tablet Take by mouth daily.  08/31/14   Historical Provider, MD  fluocinonide cream (LIDEX) 1.75 % Apply 1 application topically 3 (three) times daily. To rash except face 11/18/14   Carmon Ginsberg, PA  isosorbide mononitrate (IMDUR) 30 MG 24 hr tablet Take 30 mg by mouth daily. 10/27/14 10/27/15  Historical Provider, MD   levothyroxine (SYNTHROID, LEVOTHROID) 150 MCG tablet Take by mouth daily.  04/29/14   Historical Provider, MD  losartan (COZAAR) 100 MG tablet Take 100 mg by mouth daily.  04/29/14   Historical Provider, MD  nitroGLYCERIN (NITROSTAT) 0.4 MG SL tablet Place 0.4 mg under the tongue. 10/27/14 10/27/15  Historical Provider, MD  Omega 3-6-9 CAPS Take by mouth. 08/31/14   Historical Provider, MD  predniSONE (DELTASONE) 10 MG tablet Taper daily as follows: 6 pills, 5, 4, 3, 2, 1 11/20/14   Carmon Ginsberg, PA  simvastatin (ZOCOR) 40 MG tablet Take by mouth every evening.  08/31/14   Historical Provider, MD  torsemide (DEMADEX) 20 MG tablet Take by mouth daily.  08/31/14   Historical Provider, MD  traMADol Veatrice Bourbon) 50 MG tablet take 1 tablet by mouth three times a day 11/12/14   Margarita Rana, MD  vitamin C (ASCORBIC ACID) 250 MG tablet Take 250 mg by mouth daily.    Historical Provider, MD      VITAL SIGNS:  Blood pressure 97/55, pulse 137, temperature 98.2 F (36.8 C), temperature source Oral, resp. rate 16, weight 78.2 kg (172 lb 6.4 oz), SpO2 98 %.  PHYSICAL EXAMINATION:  GENERAL:  74 y.o.-year-old patient lying in the bed with moderate distress.  EYES: Pupils equal, round, reactive to light and accommodation. No scleral icterus. Extraocular muscles intact.  HEENT: Head atraumatic, normocephalic. Oropharynx and nasopharynx clear.  NECK:  Supple, no jugular venous distention. No thyroid enlargement, no tenderness.  LUNGS: Normal breath sounds bilaterally, no wheezing, rales,rhonchi or crepitation. No use of accessory muscles of respiration.  CARDIOVASCULAR: Tachycardia with regular rhythm, 2/6 SEM no rubs, or gallops.  ABDOMEN: Soft, nontender, nondistended. Bowel sounds present. No organomegaly or mass.  EXTREMITIES: No pedal edema, cyanosis, or clubbing.  NEUROLOGIC: Cranial nerves II through XII are grossly intact. No focal deficits. PSYCHIATRIC: The patient is alert and oriented x 3. Anxious SKIN:  No obvious rash, lesion, or ulcer.   LABORATORY PANEL:   CBC  Recent Labs Lab 11/23/14 1201  WBC 29.4*  HGB 10.1*  HCT 32.4*  PLT 240   ------------------------------------------------------------------------------------------------------------------  Chemistries   Recent Labs Lab 11/23/14 1201  NA 135  K 3.2*  CL 102  CO2 18*  GLUCOSE 91  BUN 21*  CREATININE 1.19*  CALCIUM 8.2*   ------------------------------------------------------------------------------------------------------------------  Cardiac Enzymes  Recent Labs Lab 11/23/14 1201  TROPONINI 0.07*   ------------------------------------------------------------------------------------------------------------------  RADIOLOGY:  Dg Chest Port 1 View  11/23/2014   CLINICAL DATA:  Chest pain, history of prior myocardial infarction and coronary bypass grafting  EXAM: PORTABLE CHEST - 1 VIEW  COMPARISON:  10/26/2014  FINDINGS: Cardiac shadow is at the upper limits of normal in size. A pacing device is again seen and stable. Postsurgical changes are seen. The lungs are well aerated bilaterally without focal infiltrate or sizable effusion.  IMPRESSION: No active disease.   Electronically  Signed   By: Inez Catalina M.D.   On: 11/23/2014 12:45    EKG:  It should fibrillation RVR heart rate 150  IMPRESSION AND PLAN:  This is a 74 year old female with a history of eczema atrial fibrillation and sick sinus syndrome status post pacemaker who presents with palpitations and chest pain.  1. Atrial fibrillation RVR: Patient converted into normal sinus rhythm with amiodarone. As per my conversation with cardiology, patient should remain on amiodarone overnight. Patient will continue on Eliquis.  2. Chest pain: Patient's troponin is slightly elevated troponin is likely elevated due to problem #1. However due to ongoing chest pain which seems to be out of proportion I have ordered CT chest to evaluate for dissection/pulmonary  emboli. Continue to monitor troponins.  3. Pacemaker status  4. Heart murmur: Patient had an echocardiogram recently with [facility] her ejection fraction is normal she  has moderate valvular disease.  5. Hypotension: This is secondary to tachycardia from atrial fibrillation. I am holding all hypertensive medications at this time. I suspect once her heart rate is controlled her blood pressure will also increase.  6. Leukocytosis: Patient was recently started on steroids due to a rash that she had from sunscreen. She will continue on the prednisone we will continue monitor CBC. She has no active signs of infection at this time.   7. Depression and anxiety: Patient continue on outpatient medications.   All the records are reviewed and case discussed with ED provider. Management plans discussed with the patient and she is in agreement.  CODE STATUS: Limited  CRITICAL TOTAL TIME TAKING CARE OF THIS PATIENT: 55 minutes.    Author Hatlestad M.D on 11/23/2014 at 1:11 PM  Between 7am to 6pm - Pager - [phone] After 6pm go to [email or web address] - password [email or web address] [facility]  Office  [phone]  CC: Primary care physician; Wilhemena Durie, MD

## 2014-11-23 NOTE — Progress Notes (Signed)
Dr. Nehemiah Massed notified about 2nd Troponin reported from last lab draw, 0.84. Same is an increase from previous 0.07. Pt currently appears to be sleeping with no distress.  VSS.

## 2014-11-23 NOTE — Progress Notes (Signed)
Rcvd call from lab.  3rd Troponin 6.68.  Prime Doc informed and suggested Dr. Nehemiah Massed be notified due to being ruled out for AAA.  Dr Nehemiah Massed was notified of troponin level.   No new orders relating to this received.

## 2014-11-23 NOTE — ED Notes (Addendum)
PT from home via ems with reports that she began having left sided chest pain at 0800 this am. EMS administered 324mg  ASA and 1 SL NTG.

## 2014-11-23 NOTE — ED Provider Notes (Signed)
[facility] Emergency Department Provider Note  ____________________________________________  Time seen: Approximately 12:03 PM  I have reviewed the triage vital signs and the nursing notes.   HISTORY  Chief Complaint Chest Pain    HPI Ruth Hodges is a 74 y.o. female history of coronary disease and atrial fibrillation. She reports that she started having a fluttering feeling in her chest about 8:00 this morning which she is had with previous episodes of fibrillation. She also reports that she started having chest pain that's been somewhat steady and progressive for the last 3 hours. She states she is currently having a fluttering sensation and moderate pain in the left chest. No shortness of breath. No nausea vomiting or diaphoresis. No trouble breathing. No vomiting. No abdominal pain.  Patient was seen in conjunction at the time of arrival with EMS and Dr. Midge Minium of cardiology.  Pain is radiating, to her neck.  Past Medical History  Diagnosis Date  . Anxiety   . Depression   . A-fib   . Arthritis   . Hypertension   . Hyperlipidemia   . Thyroid disease   . CAD (coronary artery disease)     Patient Active Problem List   Diagnosis Date Noted  . Atrial fibrillation 11/23/2014  . Contact dermatitis 11/18/2014  . NSTEMI (non-ST elevated myocardial infarction) 10/15/2014  . Unstable angina 10/14/2014  . Allergic rhinitis 09/05/2014  . Arthritis 09/05/2014  . A-fib 09/05/2014  . 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 Arteriosclerosis of coronary artery 09/05/2014  . Anxiety and depression 09/05/2014  . Essential (primary) hypertension 09/05/2014  . Fibrositis 09/05/2014  . H/O acute myocardial infarction 09/05/2014  . DD (diverticular disease) 09/05/2014  . HLD (hyperlipidemia) 09/05/2014  . Adult hypothyroidism 09/05/2014  . 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 Mild cognitive disorder 09/05/2014  . Carotid  arterial disease 09/05/2014  . Left ventricular hypertrophy 09/05/2014  . Aortic heart valve narrowing 09/05/2014  . Artificial cardiac pacemaker 07/21/2014    Past Surgical History  Procedure Laterality Date  . Pacemaker insertion    . Cardiac catheterization    . Bladder surgery    . Cholecystectomy    . Appendectomy    . Coronary artery bypass graft    . Vaginal hysterectomy  1972    total, ovaries removed in 1983    No current outpatient prescriptions on file.  Allergies Atorvastatin; Codeine; and Ceftin   Family History  Problem Relation Age of Onset  . Heart attack Father   . CVA Sister   . Diabetes Brother   . Heart attack Brother     Social History History  Substance Use Topics  . Smoking status: Never Smoker   . Smokeless tobacco: Not on file  . Alcohol Use: No    Review of Systems Constitutional: No fever/chills. Denies recent illness. Eyes: No visual changes. ENT: No sore throat. Cardiovascular: See history of present illness Respiratory: Denies shortness of breath. Gastrointestinal: No abdominal pain.  No nausea, no vomiting.  No diarrhea.  No constipation. Genitourinary: Negative for dysuria. Musculoskeletal: Negative for back pain. Skin: Negative for rash. Neurological: Negative for headaches, focal weakness or numbness.  10-point ROS otherwise negative.  ____________________________________________   PHYSICAL EXAM:  VITAL SIGNS: ED Triage Vitals  Enc Vitals Group     BP 11/23/14 1154 96/79 mmHg     Pulse Rate 11/23/14 1154 162     Resp 11/23/14 1154 21     Temp 11/23/14 1154 98.2  F (36.8 C)     Temp Source 11/23/14 1154 Oral     SpO2 11/23/14 1154 95 %     Weight 11/23/14 1154 172 lb 6.4 oz (78.2 kg)     Height --      Head Cir --      Peak Flow --      Pain Score 11/23/14 1202 8     Pain Loc --      Pain Edu? --      Excl. in [place]? --     Constitutional: Alert and oriented. Fatigued and mildly ill appearing and in no acute  distress. Eyes: Conjunctivae are normal. PERRL. EOMI. Head: Atraumatic. Nose: No congestion/rhinnorhea. Mouth/Throat: Mucous membranes are moist.  Oropharynx non-erythematous. Neck: No stridor.   Cardiovascular: Tachycardia with a regular rate. Grossly normal heart sounds.  Good peripheral circulation. No cyanosis. Respiratory: Normal respiratory effort.  No retractions. Lungs CTAB. Gastrointestinal: Soft and nontender. No distention. No abdominal bruits. No CVA tenderness. Musculoskeletal: No lower extremity tenderness  Neurologic:  Normal speech and language. No gross focal neurologic deficits are appreciated.  Skin:  Skin is warm, dry and intact. No rash noted. Psychiatric: Mood and affect are normal. Speech and behavior are normal.  ____________________________________________   LABS (all labs ordered are listed, but only abnormal results are displayed)  Labs Reviewed  CBC WITH DIFFERENTIAL/PLATELET - Abnormal; Notable for the following:    WBC 29.4 (*)    Hemoglobin 10.1 (*)    HCT 32.4 (*)    MCHC 31.2 (*)    RDW 16.6 (*)    Neutro Abs 24.1 (*)    Monocytes Absolute 3.2 (*)    All other components within normal limits  BASIC METABOLIC PANEL - Abnormal; Notable for the following:    Potassium 3.2 (*)    CO2 18 (*)    BUN 21 (*)    Creatinine, Ser 1.19 (*)    Calcium 8.2 (*)    GFR calc non Af Amer 43 (*)    GFR calc Af Amer 50 (*)    All other components within normal limits  TROPONIN I - Abnormal; Notable for the following:    Troponin I 0.07 (*)    All other components within normal limits  PROTIME-INR - Abnormal; Notable for the following:    Prothrombin Time 16.1 (*)    All other components within normal limits  TROPONIN I - Abnormal; Notable for the following:    Troponin I 0.84 (*)    All other components within normal limits  GLUCOSE, CAPILLARY - Abnormal; Notable for the following:    Glucose-Capillary 109 (*)    All other components within normal limits   MRSA PCR SCREENING  APTT  URINALYSIS COMPLETEWITH MICROSCOPIC ([facility] ONLY)  TROPONIN I  TROPONIN I   ____________________________________________  EKG  Reviewed and interpreted by me at the bedside with Dr. Fletcher Anon EKG time 11:50 AM Rapid atrial fibrillation with associated ST elevation in aVR, V1 with significant ST depressions seen in lateral leads QRS 88 QTc 440 ____________________________________________  RADIOLOGY  I personally viewed chest x-ray  DG Chest Port 1 View (Final result) Result time: 11/23/14 12:45:38   Final result by Rad Results In Interface (11/23/14 12:45:38)   Narrative:   CLINICAL DATA: Chest pain, history of prior myocardial infarction and coronary bypass grafting  EXAM: PORTABLE CHEST - 1 VIEW  COMPARISON: 10/26/2014  FINDINGS: Cardiac shadow is at the upper limits of normal in size. A pacing device is  again seen and stable. Postsurgical changes are seen. The lungs are well aerated bilaterally without focal infiltrate or sizable effusion.  IMPRESSION: No active disease.    ____________________________________________   PROCEDURES  Procedure(s) performed: None  Critical Care performed: Yes, see critical care note(s)  CRITICAL CARE Performed by: Delman Kitten   Total critical care time: 55  Critical care time was exclusive of separately billable procedures and treating other patients.  Critical care was necessary to treat or prevent imminent or life-threatening deterioration.  Critical care was time spent personally by me on the following activities: development of treatment plan with patient and/or surrogate as well as nursing, discussions with consultants, evaluation of patient's response to treatment, examination of patient, obtaining history from patient or surrogate, ordering and performing treatments and interventions, ordering and review of laboratory studies, ordering and review of radiographic studies, pulse oximetry and  re-evaluation of patient's condition.  ____________________________________________   INITIAL IMPRESSION / ASSESSMENT AND PLAN / ED COURSE  Pertinent labs & imaging results that were available during my care of the patient were reviewed by me and considered in my medical decision making (see chart for details).  Patient's white count is notably elevated at 30,000. Troponin slightly elevated. Do not believe the patient is suffering from acute infection, she is afebrile and denying any symptoms of recent illness. I will obtain a urinalysis, chest x-ray is clear. She denies any abdominal pain and her abdominal exam is benign at this time. My primary concern at that this is a reactive leukocytosis likely to acute cardiac disease. Dr. Nehemiah Massed and Dr. Fletcher Anon have both seen and evaluated the patient feels she would not benefit from emergent catheterization. We will, continue amiodarone infusion as the patient became hypotensive after Lopressor. Continue IV fluids. Dr. Nehemiah Massed currently in the ER has seen the patient is currently continuing with recommendations ongoing care. Patient received aspirin with EMS.  ----------------------------------------- 12:55 PM on 11/23/2014 -----------------------------------------  Dr. Benjie Karvonen of hospitalist service currently with patient. I have ordered fentanyl for ongoing chest discomfort. Heart rate is slowly improving, still some ongoing chest pain at this time.  ----------------------------------------- 2:03 PM on 11/23/2014 -----------------------------------------  Patient noted to be back in normal sinus rhythm with a heart rate of 69. She reports her chest pain is improved. She is awake and alert, still having some mild pain but improved. Repeat EKG pending at this time. Patient pending CT angio chest to rule out dissection as well, currently working to obtain intravenous access for angiography. Admitting hospitalist  service.  Improved.  ----------------------------------------- 2:11 PM on 11/23/2014 -----------------------------------------  Repeat EKG at 2:06 PM reviewed and interpreted by me Normal sinus rhythm, ventricular rate 69 QTc 4:30 QRS 88 Nonspecific T-wave abnormality, compared with previous EKG there is marked improvement in ST abnormalities. No indication of ST elevation MI based on EKG performed at 1406.  Patient pending disposition/admission to the ICU. ____________________________________________   FINAL CLINICAL IMPRESSION(S) / ED DIAGNOSES  Final diagnoses:  Ischemic chest pain  Atrial fibrillation with rapid ventricular response   Non-ST elevation MI Acute leukocytosis   Delman Kitten, MD 11/23/14 1645

## 2014-11-23 NOTE — Consult Note (Signed)
[facility] Consultation Note  Patient ID: Ruth Hodges, MRN: [id number], DOB/AGE: 1941-01-21 74 y.o. Admit date: 11/23/2014   Date of Consult: 11/23/2014 Primary Physician: Wilhemena Durie, MD Primary Cardiologist: Nehemiah Massed  Chief Complaint:  Chief Complaint  Patient presents with  . Chest Pain   Reason for Consult: paroxysmal atrial fibrillation with rapid ventricular rate and chest discomfort  HPI: 74 y.o. female with known paroxysmal atrial fibrillation with sick sinus syndrome status post previous pacemaker placement who is been on appropriate medication management. The patient also has coronary artery disease status post coronary artery bypass graft and recently moved from [place] to [place]. The patient had multiple interventions in the past 4 cardiac standpoint and angina but had continued intermittent atypical type chest discomfort and there was no other significant stenoses or requiring further intervention and that the patient had apparently all that could be done. She then has been placed on isosorbide and appropriate medication management for atrial fibrillation and had new onset atrial fibrillation with rapid ventricular rate this morning. With this she has had progressive episodes of chest pressure and shortness of breath and pain radiating into her back with an EKG showing atrial fibrillation with rapid ventricular rate and diffuse inferior lateral ST depression consistent with myocardial ischemia. Normal elevation of troponin although currently does not appear to be myocardial infarction or ST elevation myocardial infarction requiring intervention at this time. The patient typically has had a pretension on angiotensin receptor blocker as well as amlodipine no previous history of congestive heart failure. Echocardiogram recently has shown normal LV systolic function with some mild to moderate valvular heart disease currently she is still somewhat short of  breath with some chest discomfort  Past Medical History  Diagnosis Date  . Anxiety   . Depression   . A-fib   . Arthritis   . Hypertension   . Hyperlipidemia   . Thyroid disease   . CAD (coronary artery disease)       Surgical History:  Past Surgical History  Procedure Laterality Date  . Pacemaker insertion    . Cardiac catheterization    . Bladder surgery    . Cholecystectomy    . Appendectomy    . Coronary artery bypass graft    . Vaginal hysterectomy  1972    total, ovaries removed in [place]: Prior to Admission medications   Medication Sig Start Date End Date Taking? Authorizing Provider  amitriptyline (ELAVIL) 100 MG tablet Take by mouth at bedtime.  08/31/14   Historical Provider, MD  apixaban (ELIQUIS) 5 MG TABS tablet Take by mouth 2 (two) times daily.  08/31/14   Historical Provider, MD  Biotin 2500 MCG CAPS Take 5,000 mcg by mouth daily.  08/31/14   Historical Provider, MD  CALCIUM-VITAMIN D PO Take by mouth. 08/31/14   Historical Provider, MD  Cholecalciferol (VITAMIN D3) 1000 UNITS CAPS Take by mouth. 08/31/14   Historical Provider, MD  cyanocobalamin 100 MCG tablet Take by mouth. 08/31/14   Historical Provider, MD  digoxin (LANOXIN) 0.125 MG tablet Take 0.125 mg by mouth. Pt takes digitek 04/29/14   Historical Provider, MD  felodipine (PLENDIL) 10 MG 24 hr tablet Take by mouth daily.  08/31/14   Historical Provider, MD  fluocinonide cream (LIDEX) 7.00 % Apply 1 application topically 3 (three) times daily. To rash except face 11/18/14   Carmon Ginsberg, PA  isosorbide mononitrate (IMDUR) 30 MG 24 hr tablet Take 30 mg by  mouth daily. 10/27/14 10/27/15  Historical Provider, MD  levothyroxine (SYNTHROID, LEVOTHROID) 150 MCG tablet Take by mouth daily.  04/29/14   Historical Provider, MD  losartan (COZAAR) 100 MG tablet Take 100 mg by mouth daily.  04/29/14   Historical Provider, MD  nitroGLYCERIN (NITROSTAT) 0.4 MG SL tablet Place 0.4 mg under the tongue. 10/27/14  10/27/15  Historical Provider, MD  Omega 3-6-9 CAPS Take by mouth. 08/31/14   Historical Provider, MD  predniSONE (DELTASONE) 10 MG tablet Taper daily as follows: 6 pills, 5, 4, 3, 2, 1 11/20/14   Carmon Ginsberg, PA  simvastatin (ZOCOR) 40 MG tablet Take by mouth every evening.  08/31/14   Historical Provider, MD  torsemide (DEMADEX) 20 MG tablet Take by mouth daily.  08/31/14   Historical Provider, MD  traMADol Veatrice Bourbon) 50 MG tablet take 1 tablet by mouth three times a day 11/12/14   Margarita Rana, MD  vitamin C (ASCORBIC ACID) 250 MG tablet Take 250 mg by mouth daily.    Historical Provider, MD    Inpatient Medications:    . amiodarone    . amiodarone      Allergies:  Allergies  Allergen Reactions  . Atorvastatin Other (See Comments)  . Codeine   . Ceftin  [Cefuroxime Axetil] Other (See Comments)    History   Social History  . Marital Status: Married    Spouse Name: N/A  . Number of Children: N/A  . Years of Education: N/A   Occupational History  . Not on file.   Social History Main Topics  . Smoking status: Never Smoker   . Smokeless tobacco: Not on file  . Alcohol Use: No  . Drug Use: No  . Sexual Activity: Not on file   Other Topics Concern  . Not on file   Social History Narrative     Family History  Problem Relation Age of Onset  . Heart attack Father   . CVA Sister   . Diabetes Brother   . Heart attack Brother      Review of Systems Positive for chest pain shortness of breath weakness Negative for: General:  chills, fever, night sweats or weight changes.  Cardiovascular: PND orthopnea syncope dizziness  Dermatological skin lesions rashes Respiratory: Cough congestion Urologic: Frequent urination urination at night and hematuria Abdominal: negative for nausea, vomiting, diarrhea, bright red blood per rectum, melena, or hematemesis Neurologic: negative for visual changes, and/or hearing changes  All other systems reviewed and are otherwise negative  except as noted above.  Labs:  Recent Labs  11/23/14 1201  TROPONINI 0.07*   Lab Results  Component Value Date   WBC 29.4* 11/23/2014   HGB 10.1* 11/23/2014   HCT 32.4* 11/23/2014   MCV 84.8 11/23/2014   PLT 240 11/23/2014    Recent Labs Lab 11/23/14 1201  NA 135  K 3.2*  CL 102  CO2 18*  BUN 21*  CREATININE 1.19*  CALCIUM 8.2*  GLUCOSE 91   No results found for: CHOL, HDL, LDLCALC, TRIG No results found for: DDIMER  Radiology/Studies:  Dg Chest Port 1 View  11/23/2014   CLINICAL DATA:  Chest pain, history of prior myocardial infarction and coronary bypass grafting  EXAM: PORTABLE CHEST - 1 VIEW  COMPARISON:  10/26/2014  FINDINGS: Cardiac shadow is at the upper limits of normal in size. A pacing device is again seen and stable. Postsurgical changes are seen. The lungs are well aerated bilaterally without focal infiltrate or sizable effusion.  IMPRESSION: No  active disease.   Electronically Signed   By: Inez Catalina M.D.   On: 11/23/2014 12:45   Dg Chest Port 1 View  10/26/2014   CLINICAL DATA:  Woke up this morning with chest pain at her sternum and shortness of breath.  EXAM: PORTABLE CHEST - 1 VIEW  COMPARISON:  10/14/2014  FINDINGS: Stable position of the left dual chamber cardiac pacemaker. Patient has median sternotomy wires and the most cephalad wire is broken. Lungs are clear without airspace disease or edema. Heart size is normal. Negative for a pneumothorax.  IMPRESSION: No acute chest findings.   Electronically Signed   By: Markus Daft M.D.   On: 10/26/2014 09:49    EKG: Atrial fibrillation with rapid ventricular rate and lateral ST depression consistent with myocardial ischemia  Weights: Filed Weights   11/23/14 1154  Weight: 172 lb 6.4 oz (78.2 kg)     Physical Exam: Blood pressure 97/55, pulse 137, temperature 98.2 F (36.8 C), temperature source Oral, resp. rate 16, weight 172 lb 6.4 oz (78.2 kg), SpO2 98 %. Body mass index is 28.69 kg/(m^2). General:  Well developed, well nourished, in no acute distress. Head eyes ears nose throat: Normocephalic, atraumatic, sclera non-icteric, no xanthomas, nares are without discharge. No apparent thyromegaly and/or mass  Lungs: Normal respiratory effort.  Diffuse wheezes, no rales, no rhonchi.  Heart: Irregular and rapid with normal S1 soft S2. 3+ right upper sternal border murmur gallop, no rub, PMI is normal size and placement, carotid upstroke normal without bruit, jugular venous pressure is normal Abdomen: Soft, non-tender, non-distended with normoactive bowel sounds. No hepatomegaly. No rebound/guarding. No obvious abdominal masses. Abdominal aorta is normal size without bruit Extremities: Trace edema. no cyanosis, no clubbing, no ulcers  Peripheral : 2+ bilateral upper extremity pulses, 2+ bilateral femoral pulses, 2+ bilateral dorsal pedal pulse Neuro: Alert and oriented. No facial asymmetry. No focal deficit. Moves all extremities spontaneously. Musculoskeletal: Normal muscle tone without kyphosis Psych:  Responds to questions appropriately with a normal affect.    Assessment: 74 year old female with known coronary artery disease status post previous coronary artery bypass graft show hypertension makes hyperlipidemia rocks's mole non-valvular atrial fibrillation now with atrial fibrillation with rapid ventricular rate and consistent chest pain consistent with myocardial ischemia and no current evidence of ST elevation myocardial infarction  Plan: 1. Amiodarone drip for control of heart rate of atrial fibrillation and possible spontaneous conversion to normal sinus rhythm 2. Abstain from anticoagulation at this time due to concerns of bleeding risk and need for further intervention 3. Serial ECGs and enzymes to assess for possible myocardial infarction 4. Hold angiotensin receptor blocker and felodipine for concerns of hypotension 5. Intravenous fluids for concerns of hypotension 6. Further  diagnostic testing and treatment options after above  Signed, Corey Skains M.D. [facility] 11/23/2014, 12:51 PM

## 2014-11-23 NOTE — Progress Notes (Signed)
Pt experiencing urinary retention.  567 ml in bladder from scan.  In formed Prime Doc.  Order for foley received.

## 2014-11-24 LAB — APTT
aPTT: 42 seconds — ABNORMAL HIGH (ref 24–36)
aPTT: 103 seconds — ABNORMAL HIGH (ref 24–36)

## 2014-11-24 LAB — URINALYSIS COMPLETE WITH MICROSCOPIC (ARMC ONLY)
BILIRUBIN URINE: NEGATIVE
Glucose, UA: NEGATIVE mg/dL
HGB URINE DIPSTICK: NEGATIVE
KETONES UR: NEGATIVE mg/dL
LEUKOCYTES UA: NEGATIVE
NITRITE: NEGATIVE
Protein, ur: NEGATIVE mg/dL
Specific Gravity, Urine: 1.029 (ref 1.005–1.030)
pH: 5 (ref 5.0–8.0)

## 2014-11-24 LAB — CBC
HCT: 28.9 % — ABNORMAL LOW (ref 35.0–47.0)
Hemoglobin: 9.1 g/dL — ABNORMAL LOW (ref 12.0–16.0)
MCH: 26.2 pg (ref 26.0–34.0)
MCHC: 31.6 g/dL — AB (ref 32.0–36.0)
MCV: 83 fL (ref 80.0–100.0)
PLATELETS: 211 10*3/uL (ref 150–440)
RBC: 3.48 MIL/uL — AB (ref 3.80–5.20)
RDW: 16.2 % — AB (ref 11.5–14.5)
WBC: 25.3 10*3/uL — AB (ref 3.6–11.0)

## 2014-11-24 LAB — TROPONIN I: TROPONIN I: 8.43 ng/mL — AB (ref <0.031)

## 2014-11-24 LAB — BASIC METABOLIC PANEL
ANION GAP: 9 (ref 5–15)
BUN: 27 mg/dL — AB (ref 6–20)
CO2: 23 mmol/L (ref 22–32)
Calcium: 7.7 mg/dL — ABNORMAL LOW (ref 8.9–10.3)
Chloride: 104 mmol/L (ref 101–111)
Creatinine, Ser: 1.12 mg/dL — ABNORMAL HIGH (ref 0.44–1.00)
GFR calc non Af Amer: 46 mL/min — ABNORMAL LOW (ref >60)
GFR, EST AFRICAN AMERICAN: 54 mL/min — AB (ref >60)
Glucose, Bld: 143 mg/dL — ABNORMAL HIGH (ref 65–99)
Potassium: 4.2 mmol/L (ref 3.5–5.1)
SODIUM: 136 mmol/L (ref 135–145)

## 2014-11-24 LAB — HEPARIN LEVEL (UNFRACTIONATED): Heparin Unfractionated: 2.94 IU/mL — ABNORMAL HIGH (ref 0.30–0.70)

## 2014-11-24 MED ORDER — HEPARIN BOLUS VIA INFUSION
4000.0000 [IU] | Freq: Once | INTRAVENOUS | Status: AC
  Administered 2014-11-24: 4000 [IU] via INTRAVENOUS
  Filled 2014-11-24: qty 4000

## 2014-11-24 MED ORDER — HEPARIN (PORCINE) IN NACL 100-0.45 UNIT/ML-% IJ SOLN
1350.0000 [IU]/h | INTRAMUSCULAR | Status: DC
  Administered 2014-11-24: 950 [IU]/h via INTRAVENOUS
  Administered 2014-11-25: 1050 [IU]/h via INTRAVENOUS
  Administered 2014-11-26: 1250 [IU]/h via INTRAVENOUS
  Filled 2014-11-24 (×8): qty 250

## 2014-11-24 MED ORDER — AMIODARONE HCL 200 MG PO TABS
200.0000 mg | ORAL_TABLET | Freq: Every day | ORAL | Status: DC
  Administered 2014-11-24: 200 mg via ORAL
  Filled 2014-11-24: qty 1

## 2014-11-24 NOTE — Progress Notes (Signed)
Initial Nutrition Assessment     INTERVENTION:   1) Meals and Snacks: Cater to patient preferences   NUTRITION DIAGNOSIS:   No nutrition diagnosis at this time  GOAL:   Patient will meet greater than or equal to 90% of their needs  MONITOR:    (Energy Intake, Anthropometrics, Electrolyte/Renal Profile, Digestive System)  REASON FOR ASSESSMENT:   Malnutrition Screening Tool    ASSESSMENT:    Pt admitted with afib with RVR, chest pain, STEMI; pt alert, very groggy on visit today  Past Medical History  Diagnosis Date  . Anxiety   . Depression   . A-fib   . Arthritis   . Hypertension   . Hyperlipidemia   . Thyroid disease   . CAD (coronary artery disease)      Diet Order:  Diet Heart Room service appropriate?: Yes; Fluid consistency:: Thin   Energy Intake: recorded po intake 100% of meals, appetite good  Food and nutrition related history: pt reports good appetite prior to admission  Electrolyte and Renal Profile:  Recent Labs Lab 11/23/14 1201 11/24/14 0336  BUN 21* 27*  CREATININE 1.19* 1.12*  NA 135 136  K 3.2* 4.2   Glucose Profile:  Recent Labs  11/23/14 1517  GLUCAP 109*   Protein Profile: No results for input(s): ALBUMIN in the last 168 hours.  Meds: NS at 50 ml/hr, prednisone, senokot, vitamin C   Skin:  Reviewed, no issues  Last BM:  8/7  Height:   Ht Readings from Last 1 Encounters:  11/23/14 5\' 5"  (1.651 m)    Weight: pt reports weight has been relatively stable prior to admission  Wt Readings from Last 1 Encounters:  11/24/14 171 lb 3.2 oz (77.656 kg)    Wt Readings from Last 10 Encounters:  11/24/14 171 lb 3.2 oz (77.656 kg)  11/18/14 168 lb (76.204 kg)  10/26/14 163 lb (73.936 kg)  10/16/14 173 lb 4.8 oz (78.608 kg)  06/30/14 173 lb (78.472 kg)    BMI:  Body mass index is 28.49 kg/(m^2).     LOW Care Level  Kerman Passey MS, [place], LDN [phone] Pager

## 2014-11-24 NOTE — Progress Notes (Signed)
ANTICOAGULATION CONSULT NOTE - Follow up Anthony for Heparin Indication: chest pain/ACS  Allergies  Allergen Reactions  . Atorvastatin Other (See Comments)  . Codeine   . Ceftin  [Cefuroxime Axetil] Other (See Comments)    Patient Measurements: Height: 5\' 5"  (165.1 cm) Weight: 173 lb 12.8 oz (78.835 kg) (upon transfer) IBW/kg (Calculated) : 57 Heparin Dosing Weight: 73 kg   Vital Signs: Temp: 98 F (36.7 C) (08/09 1950) Temp Source: Oral (08/09 1950) BP: 101/41 mmHg (08/09 1950) Pulse Rate: 58 (08/09 1950)  Labs:  Recent Labs  11/23/14 1201 11/23/14 1531 11/23/14 2155 11/24/14 0336 11/24/14 1148 11/24/14 1914  HGB 10.1*  --   --  9.1*  --   --   HCT 32.4*  --   --  28.9*  --   --   PLT 240  --   --  211  --   --   APTT 29  --   --   --  42* 103*  LABPROT 16.1*  --   --   --   --   --   INR 1.27  --   --   --   --   --   HEPARINUNFRC  --   --   --   --  2.94*  --   CREATININE 1.19*  --   --  1.12*  --   --   TROPONINI 0.07* 0.84* 6.68* 8.43*  --   --     Estimated Creatinine Clearance: 44.3 mL/min (by C-G formula based on Cr of 1.12).   Medical History: Past Medical History  Diagnosis Date  . Anxiety   . Depression   . A-fib   . Arthritis   . Hypertension   . Hyperlipidemia   . Thyroid disease   . CAD (coronary artery disease)     Medications:  Scheduled:  . amiodarone  200 mg Oral Daily  . amitriptyline  100 mg Oral QHS  . digoxin  0.125 mg Oral Daily  . isosorbide mononitrate  30 mg Oral Daily  . levothyroxine  150 mcg Oral QAC breakfast  . predniSONE  50 mg Oral Q breakfast  . simvastatin  40 mg Oral QPM  . sodium chloride  10-40 mL Intracatheter Q12H  . traMADol  50 mg Oral TID  . vitamin C  250 mg Oral Daily    Assessment: Patient being treated for NSTEMI. Previously on apixaban. Last dose of apixaban was yesterday per MD.  Goal of Therapy:  Heparin level 0.3-0.7 units/ml Monitor platelets by anticoagulation  protocol: Yes   Plan:   Base line APTT and Heparin level ordered per protocol since patient's last dose of Apixaban was <72 hours ago. If baseline heparin level <0.1 can use heparin level to monitor. If Heparin level >0.1 use APTT to follow.   Give 4000 units bolus x 1 Start heparin infusion at 950 units/hr Continue to monitor H&H and platelets    First aPTT at 1914 = 103, within goal range Will continue current rate of infusion (RN confirms heparin running at 9.5 ml/hr).  Next aPTT for confirmation at 0130. No bleeding or line issues noted per RN. CBC and HL in AM.    Rayna Sexton L 11/24/2014,8:02 PM

## 2014-11-24 NOTE — Progress Notes (Signed)
ANTICOAGULATION CONSULT NOTE - Initial Consult  Pharmacy Consult for Heparin Indication: chest pain/ACS  Allergies  Allergen Reactions  . Atorvastatin Other (See Comments)  . Codeine   . Ceftin  [Cefuroxime Axetil] Other (See Comments)    Patient Measurements: Height: 5\' 5"  (165.1 cm) Weight: 171 lb 3.2 oz (77.656 kg) IBW/kg (Calculated) : 57 Heparin Dosing Weight: 73 kg   Vital Signs: Temp: 99.6 F (37.6 C) (08/09 0600) Temp Source: Oral (08/09 0500) BP: 85/43 mmHg (08/09 1000) Pulse Rate: 59 (08/09 1000)  Labs:  Recent Labs  11/23/14 1201 11/23/14 1531 11/23/14 2155 11/24/14 0336  HGB 10.1*  --   --  9.1*  HCT 32.4*  --   --  28.9*  PLT 240  --   --  211  APTT 29  --   --   --   LABPROT 16.1*  --   --   --   INR 1.27  --   --   --   CREATININE 1.19*  --   --  1.12*  TROPONINI 0.07* 0.84* 6.68* 8.43*    Estimated Creatinine Clearance: 44.1 mL/min (by C-G formula based on Cr of 1.12).   Medical History: Past Medical History  Diagnosis Date  . Anxiety   . Depression   . A-fib   . Arthritis   . Hypertension   . Hyperlipidemia   . Thyroid disease   . CAD (coronary artery disease)     Medications:  Scheduled:  . amiodarone  200 mg Oral Daily  . amitriptyline  100 mg Oral QHS  . digoxin  0.125 mg Oral Daily  . isosorbide mononitrate  30 mg Oral Daily  . levothyroxine  150 mcg Oral QAC breakfast  . predniSONE  50 mg Oral Q breakfast  . simvastatin  40 mg Oral QPM  . sodium chloride  10-40 mL Intracatheter Q12H  . traMADol  50 mg Oral TID  . vitamin C  250 mg Oral Daily    Assessment: Patient being treated for NSTEMI. Previously on apixaban. Last dose of apixaban was yesterday per MD.  Goal of Therapy:  Heparin level 0.3-0.7 units/ml Monitor platelets by anticoagulation protocol: Yes   Plan:   Base line APTT and Heparin level ordered per protocol since patient's last dose of Apixaban was <72 hours ago. If baseline heparin level <0.1 can use  heparin level to monitor. If Heparin level >0.1 use APTT to follow.   Give 4000 units bolus x 1 Start heparin infusion at 950 units/hr Continue to monitor H&H and platelets  Bayla Mcgovern D 11/24/2014,11:47 AM

## 2014-11-24 NOTE — Progress Notes (Signed)
Pt transferred to 2a, rm 235, report called to [employer].  Pt has been sleeping the majority of the shift.  No apparent distress.  Heparin drip started prior to transfer.

## 2014-11-24 NOTE — Plan of Care (Signed)
Problem: Phase I Progression Outcomes Goal: Other Phase I Outcomes/Goals Outcome: Completed/Met Date Met:  11/24/14 Pt is alert and oriented x 4, transferred from CCU, received report from [place], atrial paced on tele, up to bathroom with one assist, good appetite, c/o back/ neck pain improved with prn morphine and scheduled tramadol, sleeping in between care, heparin drip infusing, IV fluids infusing, on acute oxygen, uneventful shift.

## 2014-11-24 NOTE — Progress Notes (Signed)
Pt started to become hypoxic oxygen increased top 4L/with no results. Pt then placed on a 100%NRB. Pt also c/o chest heaviness, and tachypnic and was given 1 nitro tab which was effective  For chest discomfort. Dr. Jannifer Franklin notified to see patient and pt c/o being anxious and was ordered ativan which was given will cont. To monitor.

## 2014-11-24 NOTE — Progress Notes (Signed)
[facility] Encounter Note  Patient: Ruth Hodges / Admit Date: 11/23/2014 / Date of Encounter: 11/24/2014, 8:29 AM   Subjective: Continued mild chest and back pain but significantly improved from yesterday  Review of Systems: Positive for: Chest and back pain Negative for: Vision change, hearing change, syncope, dizziness, nausea, vomiting,diarrhea, bloody stool, stomach pain, cough, congestion, diaphoresis, urinary frequency, urinary pain,skin lesions, skin rashes Others previously listed  Objective: Telemetry: Normal sinus rhythm Physical Exam: Blood pressure 99/41, pulse 61, temperature 99.6 F (37.6 C), temperature source Oral, resp. rate 31, height 5\' 5"  (1.651 m), weight 171 lb 3.2 oz (77.656 kg), SpO2 90 %. Body mass index is 28.49 kg/(m^2). General: Well developed, well nourished, in no acute distress. Head: Normocephalic, atraumatic, sclera non-icteric, no xanthomas, nares are without discharge. Neck: No apparent masses Lungs: Normal respirations with few wheezes, no rhonchi, basilar rales , no crackles   Heart: Regular rate and rhythm, normal S1 soft S2, 3 out of 6 right upper sternal border murmur, no rub, no gallop, PMI is normal size and placement, carotid upstroke normal with bruit, jugular venous pressure normal Abdomen: Soft, non-tender, non-distended with normoactive bowel sounds. No hepatosplenomegaly. Abdominal aorta is normal size without bruit Extremities: Trace edema, no clubbing, no cyanosis, no ulcers,  Peripheral: 2+ radial, 2+ femoral, 2+ dorsal pedal pulses Neuro: Alert and oriented. Moves all extremities spontaneously. Psych:  Responds to questions appropriately with a normal affect.   Intake/Output Summary (Last 24 hours) at 11/24/14 0829 Last data filed at 11/24/14 0700  Gross per 24 hour  Intake 1949.8 ml  Output    600 ml  Net 1349.8 ml    Inpatient Medications:  . amiodarone  200 mg Oral Daily  . amitriptyline  100 mg Oral  QHS  . digoxin  0.125 mg Oral Daily  . isosorbide mononitrate  30 mg Oral Daily  . levothyroxine  150 mcg Oral QAC breakfast  . predniSONE  50 mg Oral Q breakfast  . simvastatin  40 mg Oral QPM  . sodium chloride  10-40 mL Intracatheter Q12H  . traMADol  50 mg Oral TID  . vitamin C  250 mg Oral Daily   Infusions:  . sodium chloride 50 mL/hr at 11/23/14 1551    Labs:  Recent Labs  11/23/14 1201 11/24/14 0336  NA 135 136  K 3.2* 4.2  CL 102 104  CO2 18* 23  GLUCOSE 91 143*  BUN 21* 27*  CREATININE 1.19* 1.12*  CALCIUM 8.2* 7.7*   No results for input(s): AST, ALT, ALKPHOS, BILITOT, PROT, ALBUMIN in the last 72 hours.  Recent Labs  11/23/14 1201 11/24/14 0336  WBC 29.4* 25.3*  NEUTROABS 24.1*  --   HGB 10.1* 9.1*  HCT 32.4* 28.9*  MCV 84.8 83.0  PLT 240 211    Recent Labs  11/23/14 1201 11/23/14 1531 11/23/14 2155 11/24/14 0336  TROPONINI 0.07* 0.84* 6.68* 8.43*   Invalid input(s): POCBNP No results for input(s): HGBA1C in the last 72 hours.   Weights: Filed Weights   11/23/14 1154 11/24/14 0441  Weight: 172 lb 6.4 oz (78.2 kg) 171 lb 3.2 oz (77.656 kg)     Radiology/Studies:  Ct Chest Wo Contrast  11/23/2014   CLINICAL DATA:  Chest pain. Originally ordered for dissection, but the IV infiltrated and was evaluated by Dr. Martinique. No intravenous contrast is present within the chest.  EXAM: CT CHEST WITHOUT CONTRAST  TECHNIQUE: Multidetector CT imaging of the chest was performed following the  standard protocol without IV contrast.  COMPARISON:  None.  FINDINGS: The central airways are patent. The lungs are clear. There is no pleural effusion or pneumothorax.  There are no pathologically enlarged axillary, hilar or mediastinal lymph nodes.  The heart size is enlarged. There is evidence of prior CABG. There is no pericardial effusion. The thoracic aorta is normal in caliber.  Review of bone windows demonstrates no focal lytic or sclerotic lesions.  Limited  non-contrast images of the upper abdomen were obtained. The adrenal glands appear normal. There is a mild nodular contour of the liver as can be seen with hepatocellular disease.  IMPRESSION: 1. No acute disease of the chest. Aortic dissection cannot be evaluated on this examination secondary to lack of intravenous contrast.   Electronically Signed   By: Kathreen Devoid   On: 11/23/2014 22:30   Dg Chest Port 1 View  11/23/2014   CLINICAL DATA:  74 year old female status post central line placement  EXAM: PORTABLE CHEST - 1 VIEW  COMPARISON:  Prior chest x-ray obtained earlier today  FINDINGS: New right upper extremity approach PICC. Catheter tip overlies the distal SVC in good position. Stable position of left subclavian approach cardiac rhythm maintenance device with leads projecting over the right atrium and right ventricle. Stable mild cardiomegaly. Patient is status post median sternotomy. Mild vascular congestion without overt edema. No evidence of pneumothorax or pleural effusion. No acute osseous abnormality.  IMPRESSION: The tip of the new right upper extremity PICC projects over the distal SVC.   Electronically Signed   By: Jacqulynn Cadet M.D.   On: 11/23/2014 21:28   Dg Chest Port 1 View  11/23/2014   CLINICAL DATA:  Chest pain, history of prior myocardial infarction and coronary bypass grafting  EXAM: PORTABLE CHEST - 1 VIEW  COMPARISON:  10/26/2014  FINDINGS: Cardiac shadow is at the upper limits of normal in size. A pacing device is again seen and stable. Postsurgical changes are seen. The lungs are well aerated bilaterally without focal infiltrate or sizable effusion.  IMPRESSION: No active disease.   Electronically Signed   By: Inez Catalina M.D.   On: 11/23/2014 12:45   Dg Chest Port 1 View  10/26/2014   CLINICAL DATA:  Woke up this morning with chest pain at her sternum and shortness of breath.  EXAM: PORTABLE CHEST - 1 VIEW  COMPARISON:  10/14/2014  FINDINGS: Stable position of the left dual  chamber cardiac pacemaker. Patient has median sternotomy wires and the most cephalad wire is broken. Lungs are clear without airspace disease or edema. Heart size is normal. Negative for a pneumothorax.  IMPRESSION: No acute chest findings.   Electronically Signed   By: Markus Daft M.D.   On: 10/26/2014 09:49     Assessment and Recommendation  74 y.o. female with acute onset of atrial fibrillation with rapid ventricular rate with the non-ST elevation myocardial infarction and known coronary artery disease status post previous coronary artery bypass graft now 6 having slight improvements with medication management as above 1. Abstain from anticoagulation at this time due to maintenance of normal sinus rhythm and possible further intervention from the cardiac standpoint and/or cardiac catheterization 2. Continue digoxin and beta blocker for heart rate control of atrial fibrillation and change amiodarone to oral 3. Continue heparin if able for 24 more hours or myocardial infarction 4. Echocardiogram for extent of myocardial infarction LV systolic dysfunction contributing to above 5. Begin ambulation following for any further significant symptoms and need  for intervention listed above with possible transfer to telemetry  Signed, Serafina Royals M.D. FACC

## 2014-11-24 NOTE — Care Management Note (Addendum)
Case Management Note  Patient Details  Name: Ruth Hodges MRN: [id number] Date of Birth: 05/14/1940  Subjective/Objective:      74yo Ruth Ruth Hodges was admitted from [place] per c/o chest pain. PCP=Dr Miguel Aschoff. Pharmacy=[facility] on [address]. Ruth Hodges denies any equipment needs at [facility]. Unable to explore needs further at this time per Ruth Hodges is tired. Will attempt assessment later today. Has a pacemaker. Troponin is 8.43 today which is trending up from Troponin of 0.84 on 11/23/14 at 15:33pm.  Ruth Hodges may reside in [place] at [facility]. CM will follow for discharge planning.            Action/Plan:   Expected Discharge Date:                  Expected Discharge Plan:     In-House Referral:     Discharge planning Services     Post Acute Care Choice:    Choice offered to:     DME Arranged:    DME Agency:     HH Arranged:    [place] Agency:     Status of Service:     Medicare Important Message Given:    Date Medicare IM Given:    Medicare IM give by:    Date Additional Medicare IM Given:    Additional Medicare Important Message give by:     If discussed at [place] of Stay Meetings, dates discussed:    Additional Comments:  Lashonta Pilling A, RN 11/24/2014, 2:17 PM

## 2014-11-24 NOTE — Progress Notes (Signed)
[place] [facility] [place] NAME: Ruth Hodges    MR#:  [id number]  DATE OF BIRTH:  Oct 31, 1940  SUBJECTIVE:  CHIEF COMPLAINT:   Chief Complaint  Patient presents with  . Chest Pain    REVIEW OF SYSTEMS:   CONSTITUTIONAL: No fever, fatigue or weakness.  EYES: No blurred or double vision.  EARS, NOSE, AND THROAT: No tinnitus or ear pain.  RESPIRATORY: No cough, positive occasional shortness of breath, no wheezing or hemoptysis.  CARDIOVASCULAR: Positive chest pain, no orthopnea or edema.  GASTROINTESTINAL: No nausea, vomiting, diarrhea or abdominal pain.  GENITOURINARY: No dysuria, hematuria.  ENDOCRINE: No polyuria, nocturia,  HEMATOLOGY: No anemia, positive easy bruising no bleeding SKIN: No rash or lesion. MUSCULOSKELETAL: No joint pain or arthritis.  NEUROLOGIC: No tingling, numbness, weakness.  PSYCHIATRY: Positive anxiety and depression.   ROS  DRUG ALLERGIES:   Allergies  Allergen Reactions  . Atorvastatin Other (See Comments)  . Codeine   . Ceftin  [Cefuroxime Axetil] Other (See Comments)    VITALS:  Blood pressure 108/41, pulse 63, temperature 97.9 F (36.6 C), temperature source Oral, resp. rate 18, height 5\' 5"  (1.651 m), weight 77.656 kg (171 lb 3.2 oz), SpO2 95 %.  PHYSICAL EXAMINATION:  GENERAL: 74 y.o.-year-old patient lying in the bed with moderate distress.  EYES: Pupils equal, round, reactive to light and accommodation. No scleral icterus. Extraocular muscles intact.  HEENT: Head atraumatic, normocephalic. Oropharynx and nasopharynx clear.  NECK: Supple, no jugular venous distention. No thyroid enlargement, no tenderness.  LUNGS: Normal breath sounds bilaterally, no wheezing, rales,rhonchi or crepitation. No use of accessory muscles of respiration.  CARDIOVASCULAR: S1 S2 normal, regular, 2/6 SEM no rubs, or gallops.  ABDOMEN: Soft, nontender, nondistended. Bowel sounds present. No  organomegaly or mass.  EXTREMITIES: No pedal edema, cyanosis, or clubbing.  NEUROLOGIC: Cranial nerves II through XII are grossly intact. No focal deficits. PSYCHIATRIC: The patient is sleepy as did not had good sleep. But easily arousable and oriented x 3. Anxious SKIN: No obvious rash, lesion, or ulcer.   Physical Exam LABORATORY PANEL:   CBC  Recent Labs Lab 11/24/14 0336  WBC 25.3*  HGB 9.1*  HCT 28.9*  PLT 211   ------------------------------------------------------------------------------------------------------------------  Chemistries   Recent Labs Lab 11/24/14 0336  NA 136  K 4.2  CL 104  CO2 23  GLUCOSE 143*  BUN 27*  CREATININE 1.12*  CALCIUM 7.7*   ------------------------------------------------------------------------------------------------------------------  Cardiac Enzymes  Recent Labs Lab 11/23/14 2155 11/24/14 0336  TROPONINI 6.68* 8.43*   ------------------------------------------------------------------------------------------------------------------  RADIOLOGY:  Ct Chest Wo Contrast  11/23/2014   CLINICAL DATA:  Chest pain. Originally ordered for dissection, but the IV infiltrated and was evaluated by Dr. Martinique. No intravenous contrast is present within the chest.  EXAM: CT CHEST WITHOUT CONTRAST  TECHNIQUE: Multidetector CT imaging of the chest was performed following the standard protocol without IV contrast.  COMPARISON:  None.  FINDINGS: The central airways are patent. The lungs are clear. There is no pleural effusion or pneumothorax.  There are no pathologically enlarged axillary, hilar or mediastinal lymph nodes.  The heart size is enlarged. There is evidence of prior CABG. There is no pericardial effusion. The thoracic aorta is normal in caliber.  Review of bone windows demonstrates no focal lytic or sclerotic lesions.  Limited non-contrast images of the upper abdomen were obtained. The adrenal glands appear normal. There is a mild  nodular contour of the liver as can be  seen with hepatocellular disease.  IMPRESSION: 1. No acute disease of the chest. Aortic dissection cannot be evaluated on this examination secondary to lack of intravenous contrast.   Electronically Signed   By: Kathreen Devoid   On: 11/23/2014 22:30   Dg Chest Port 1 View  11/23/2014   CLINICAL DATA:  74 year old female status post central line placement  EXAM: PORTABLE CHEST - 1 VIEW  COMPARISON:  Prior chest x-ray obtained earlier today  FINDINGS: New right upper extremity approach PICC. Catheter tip overlies the distal SVC in good position. Stable position of left subclavian approach cardiac rhythm maintenance device with leads projecting over the right atrium and right ventricle. Stable mild cardiomegaly. Patient is status post median sternotomy. Mild vascular congestion without overt edema. No evidence of pneumothorax or pleural effusion. No acute osseous abnormality.  IMPRESSION: The tip of the new right upper extremity PICC projects over the distal SVC.   Electronically Signed   By: Jacqulynn Cadet M.D.   On: 11/23/2014 21:28   Dg Chest Port 1 View  11/23/2014   CLINICAL DATA:  Chest pain, history of prior myocardial infarction and coronary bypass grafting  EXAM: PORTABLE CHEST - 1 VIEW  COMPARISON:  10/26/2014  FINDINGS: Cardiac shadow is at the upper limits of normal in size. A pacing device is again seen and stable. Postsurgical changes are seen. The lungs are well aerated bilaterally without focal infiltrate or sizable effusion.  IMPRESSION: No active disease.   Electronically Signed   By: Inez Catalina M.D.   On: 11/23/2014 12:45    ASSESSMENT AND PLAN:   * Atrial fibrillation RVR: Patient converted into normal sinus rhythm with amiodarone.   On oral amio now, monitor on tele.  * NSTEMI   Troponin rise to 8   Cardiologist suggested to start on heparin drip, and cardiac cath tomorrow.    * Heart murmur: Patient had an echocardiogram recently with  [facility] her ejection fraction is normal she  has moderate valvular disease.  *  Hypotension:   secondary to tachycardia from atrial fibrillation.    holding all hypertensive medications at this time.  * Leukocytosis: Patient was recently started on steroids due to a rash that she had from sunscreen. She will continue on the prednisone we will continue monitor CBC. She has no active signs of infection at this time.  blood cx negative.  * Depression and anxiety: Patient continue on outpatient medications.  All the records are reviewed and case discussed with Care Management/Social Workerr. Management plans discussed with the patient, family and they are in agreement.  CODE STATUS: limited ( do everything except intubation and ventilation)  TOTAL TIME TAKING CARE OF THIS PATIENT: 35 minutes.     POSSIBLE D/C IN 2-3 DAYS, DEPENDING ON CLINICAL CONDITION.   Vaughan Basta M.D on 11/24/2014   Between 7am to 6pm - Pager - [phone]  After 6pm go to [email or web address] - password [email or web address] [facility]  Office  [phone]  CC: Primary care physician; Wilhemena Durie, MD

## 2014-11-24 NOTE — Care Management (Signed)
Patient is listed under discharge note that she is from ALF. I have left message at [phone] for family to call. Patient was sleeping when I rounded.

## 2014-11-24 NOTE — Clinical Social Work Note (Signed)
Clinical Social Work Assessment  Patient Details  Name: Ruth Hodges MRN: [id number] Date of Birth: April 24, 1940  Date of referral:  11/24/14               Reason for consult:   (RN CM consulted CSW due to thinking patient was from an ALF)                Permission sought to share information with:  Facility Art therapist granted to share information::  Yes, Verbal Permission Granted  Name::        Agency::     Relationship::     Contact Information:     Housing/Transportation Living arrangements for the past 2 months:  Apartment Source of Information:  Patient Patient Interpreter Needed:  None Criminal Activity/Legal Involvement Pertinent to Current Situation/Hospitalization:  No - Comment as needed Significant Relationships:  Adult Children Lives with:  Self Do you feel safe going back to the place where you live?  Yes Need for family participation in patient care:  No (Coment)  Care giving concerns:  Patient lives in [place] apartments   Social Worker assessment / plan:  CSW spoke with patient this afternoon who stated her husband lived at [facility] and that she lived in the assisted living associated with [facility]. CSW contacted Hilda Blades at [facility] regarding this because there was no assisted living affiliated with [facility] to CSW's knowledge. Hilda Blades confirmed that patient's husband was in their nursing home facility and that patient lives in the independent apartments at [facility] which is next door to their campus. Extra assistance is sometimes available to those independent apartments by [facility] staff but patient lives independently in the apartments. Hilda Blades at [facility] stated that patient always refers to her living arrangement as being assisted living no matter how many times she is corrected. Unsure at this time of patient's needs at discharge. If patient requires rehab, [facility] typically will make special  arrangements to take the [facility] residents. PT has not evaluated patient at this time.  Employment status:  Retired Forensic scientist:  Medicare PT Recommendations:  Not assessed at this time Information / Referral to community resources:     Patient/Family's Response to care:  Open to recommendations.  Patient/Family's Understanding of and Emotional Response to Diagnosis, Current Treatment, and Prognosis:  Pleasant and cooperative Emotional Assessment Appearance:  Appears stated age Attitude/Demeanor/Rapport:   (pleasant and cooperative) Affect (typically observed):  Accepting, Adaptable, Appropriate Orientation:  Oriented to Self, Oriented to Place, Oriented to Situation Alcohol / Substance use:  Not Applicable Psych involvement (Current and /or in the community):  No (Comment)  Discharge Needs  Concerns to be addressed:  Care Coordination Readmission within the last 30 days:  No Current discharge risk:  None Barriers to Discharge:  No Barriers Identified   Shela Leff, LCSW 11/24/2014, 3:01 PM

## 2014-11-25 ENCOUNTER — Inpatient Hospital Stay: Payer: Medicare Other

## 2014-11-25 DIAGNOSIS — R0600 Dyspnea, unspecified: Secondary | ICD-10-CM

## 2014-11-25 DIAGNOSIS — R0902 Hypoxemia: Secondary | ICD-10-CM

## 2014-11-25 DIAGNOSIS — I481 Persistent atrial fibrillation: Secondary | ICD-10-CM

## 2014-11-25 LAB — CBC
HCT: 26.3 % — ABNORMAL LOW (ref 35.0–47.0)
Hemoglobin: 8.4 g/dL — ABNORMAL LOW (ref 12.0–16.0)
MCH: 26.7 pg (ref 26.0–34.0)
MCHC: 32 g/dL (ref 32.0–36.0)
MCV: 83.6 fL (ref 80.0–100.0)
PLATELETS: 163 10*3/uL (ref 150–440)
RBC: 3.15 MIL/uL — ABNORMAL LOW (ref 3.80–5.20)
RDW: 16.2 % — ABNORMAL HIGH (ref 11.5–14.5)
WBC: 19.3 10*3/uL — ABNORMAL HIGH (ref 3.6–11.0)

## 2014-11-25 LAB — BASIC METABOLIC PANEL
Anion gap: 6 (ref 5–15)
BUN: 25 mg/dL — ABNORMAL HIGH (ref 6–20)
CO2: 25 mmol/L (ref 22–32)
Calcium: 8.1 mg/dL — ABNORMAL LOW (ref 8.9–10.3)
Chloride: 108 mmol/L (ref 101–111)
Creatinine, Ser: 0.94 mg/dL (ref 0.44–1.00)
GFR calc Af Amer: >60 mL/min (ref >60)
GFR calc non Af Amer: 57 mL/min — ABNORMAL LOW (ref >60)
Glucose, Bld: 118 mg/dL — ABNORMAL HIGH (ref 65–99)
Potassium: 3.8 mmol/L (ref 3.5–5.1)
Sodium: 139 mmol/L (ref 135–145)

## 2014-11-25 LAB — HEPARIN LEVEL (UNFRACTIONATED): Heparin Unfractionated: 1.34 IU/mL — ABNORMAL HIGH (ref 0.30–0.70)

## 2014-11-25 LAB — GLUCOSE, CAPILLARY: GLUCOSE-CAPILLARY: 163 mg/dL — AB (ref 65–99)

## 2014-11-25 LAB — APTT
APTT: 60 s — AB (ref 24–36)
APTT: 50 s — AB (ref 24–36)

## 2014-11-25 MED ORDER — LORAZEPAM 0.5 MG PO TABS
0.5000 mg | ORAL_TABLET | Freq: Four times a day (QID) | ORAL | Status: DC | PRN
  Administered 2014-11-25: 0.5 mg via ORAL
  Filled 2014-11-25: qty 1

## 2014-11-25 MED ORDER — ROSUVASTATIN CALCIUM 10 MG PO TABS
10.0000 mg | ORAL_TABLET | Freq: Every day | ORAL | Status: DC
  Administered 2014-11-25 – 2014-12-01 (×6): 10 mg via ORAL
  Filled 2014-11-25 (×6): qty 1

## 2014-11-25 MED ORDER — LORAZEPAM 2 MG/ML IJ SOLN
0.5000 mg | Freq: Four times a day (QID) | INTRAMUSCULAR | Status: DC | PRN
  Administered 2014-11-25 – 2014-11-30 (×6): 0.5 mg via INTRAVENOUS
  Filled 2014-11-25 (×6): qty 1

## 2014-11-25 MED ORDER — METOPROLOL TARTRATE 1 MG/ML IV SOLN
5.0000 mg | Freq: Once | INTRAVENOUS | Status: AC
Start: 2014-11-25 — End: 2014-11-25
  Administered 2014-11-25: 5 mg via INTRAVENOUS

## 2014-11-25 MED ORDER — DILTIAZEM HCL 25 MG/5ML IV SOLN
10.0000 mg | Freq: Once | INTRAVENOUS | Status: AC
Start: 2014-11-25 — End: 2014-11-25
  Administered 2014-11-25: 10 mg via INTRAVENOUS

## 2014-11-25 MED ORDER — FUROSEMIDE 10 MG/ML IJ SOLN
40.0000 mg | Freq: Two times a day (BID) | INTRAMUSCULAR | Status: DC
  Administered 2014-11-25 – 2014-11-27 (×5): 40 mg via INTRAVENOUS
  Filled 2014-11-25 (×6): qty 4

## 2014-11-25 MED ORDER — METOPROLOL TARTRATE 1 MG/ML IV SOLN
5.0000 mg | Freq: Once | INTRAVENOUS | Status: AC
  Administered 2014-11-25: 5 mg via INTRAVENOUS

## 2014-11-25 MED ORDER — FUROSEMIDE 10 MG/ML IJ SOLN
40.0000 mg | Freq: Once | INTRAMUSCULAR | Status: AC
  Administered 2014-11-25: 40 mg via INTRAVENOUS

## 2014-11-25 MED ORDER — VANCOMYCIN HCL IN DEXTROSE 750-5 MG/150ML-% IV SOLN
750.0000 mg | Freq: Once | INTRAVENOUS | Status: AC
  Administered 2014-11-25: 750 mg via INTRAVENOUS
  Filled 2014-11-25: qty 150

## 2014-11-25 MED ORDER — DILTIAZEM HCL 25 MG/5ML IV SOLN
INTRAVENOUS | Status: AC
  Filled 2014-11-25: qty 5

## 2014-11-25 MED ORDER — DILTIAZEM HCL 25 MG/5ML IV SOLN
10.0000 mg | Freq: Once | INTRAVENOUS | Status: AC
  Administered 2014-11-25: 10 mg via INTRAVENOUS

## 2014-11-25 MED ORDER — OXYCODONE HCL 5 MG PO TABS
5.0000 mg | ORAL_TABLET | Freq: Four times a day (QID) | ORAL | Status: DC | PRN
  Administered 2014-11-30 – 2014-12-01 (×2): 5 mg via ORAL
  Filled 2014-11-25 (×3): qty 1

## 2014-11-25 MED ORDER — AMIODARONE HCL 200 MG PO TABS
400.0000 mg | ORAL_TABLET | Freq: Every day | ORAL | Status: DC
  Administered 2014-11-25: 400 mg via ORAL
  Filled 2014-11-25: qty 2

## 2014-11-25 MED ORDER — DILTIAZEM HCL 25 MG/5ML IV SOLN
INTRAVENOUS | Status: AC
  Administered 2014-11-25: 10 mg via INTRAVENOUS
  Filled 2014-11-25: qty 5

## 2014-11-25 MED ORDER — VANCOMYCIN HCL IN DEXTROSE 750-5 MG/150ML-% IV SOLN
750.0000 mg | Freq: Two times a day (BID) | INTRAVENOUS | Status: DC
  Administered 2014-11-25 – 2014-11-27 (×5): 750 mg via INTRAVENOUS
  Filled 2014-11-25 (×8): qty 150

## 2014-11-25 MED ORDER — METOPROLOL TARTRATE 1 MG/ML IV SOLN
INTRAVENOUS | Status: AC
  Administered 2014-11-25: 5 mg via INTRAVENOUS
  Filled 2014-11-25: qty 5

## 2014-11-25 MED ORDER — AMIODARONE HCL IN DEXTROSE 360-4.14 MG/200ML-% IV SOLN
30.0000 mg/h | INTRAVENOUS | Status: DC
  Administered 2014-11-25 – 2014-11-27 (×4): 30 mg/h via INTRAVENOUS
  Filled 2014-11-25 (×10): qty 200

## 2014-11-25 MED ORDER — AMIODARONE HCL IN DEXTROSE 360-4.14 MG/200ML-% IV SOLN
60.0000 mg/h | INTRAVENOUS | Status: AC
  Administered 2014-11-25: 60 mg/h via INTRAVENOUS
  Filled 2014-11-25: qty 200

## 2014-11-25 NOTE — Progress Notes (Signed)
Dr. Darvin Neighbours notified pt still in pain. Go ahead and give sublingual nitro before sending to unit

## 2014-11-25 NOTE — Consult Note (Signed)
[facility]     Reason for Consult: Fever, leukocytosis, bacteremia    Referring Physician: Sudini Date of Admission:  11/23/2014   Active Problems:   Atrial fibrillation   HPI: Ruth Hodges is a 74 y.o. female with history of sick sinus syndrome status post pacemaker and atrial fibrillation on anticoagulation who presents 8/8 with palpitations and chest pain. Found to be in RVR with HR in 150s.  She also had a leukocytosis. She was treated with amio and stabilized but then had repeat decompensation.  Since admission 1/2 bcx + GPC. She has had a fever to 102.  She had a recent skin outbreak from sunscreen where she says her skin was all inflamed and she was treated with steroids. She does not report any purulence or boils with this outbreak and it has responded nicely to prednisone. She has not had any recent procedures or dental work but does have some ongoing poor dentition.   Past Medical History  Diagnosis Date  . Anxiety   . Depression   . A-fib   . Arthritis   . Hypertension   . Hyperlipidemia   . Thyroid disease   . CAD (coronary artery disease)    Past Surgical History  Procedure Laterality Date  . Pacemaker insertion    . Cardiac catheterization    . Bladder surgery    . Cholecystectomy    . Appendectomy    . Coronary artery bypass graft    . Vaginal hysterectomy  1972    total, ovaries removed in 1983   Social History  Substance Use Topics  . Smoking status: Never Smoker   . Smokeless tobacco: None  . Alcohol Use: No   Family History  Problem Relation Age of Onset  . Heart attack Father   . CVA Sister   . Diabetes Brother   . Heart attack Brother     Allergies:  Allergies  Allergen Reactions  . Atorvastatin Other (See Comments)  . Codeine   . Ceftin  [Cefuroxime Axetil] Other (See Comments)    Current antibiotics: Antibiotics Given (last 72 hours)    None      MEDICATIONS: . amitriptyline  100 mg Oral QHS  . digoxin   0.125 mg Oral Daily  . furosemide  40 mg Intravenous BID  . furosemide  40 mg Intravenous Once  . isosorbide mononitrate  30 mg Oral Daily  . levothyroxine  150 mcg Oral QAC breakfast  . predniSONE  50 mg Oral Q breakfast  . rosuvastatin  10 mg Oral Daily  . sodium chloride  10-40 mL Intracatheter Q12H  . traMADol  50 mg Oral TID  . vitamin C  250 mg Oral Daily    Review of Systems - 11 systems reviewed and negative per HPI   OBJECTIVE: Temp:  [97.9 F (36.6 C)-99.2 F (37.3 C)] 99.2 F (37.3 C) (08/10 1000) Pulse Rate:  [48-168] 48 (08/10 1500) Resp:  [18-33] 27 (08/10 1500) BP: (98-154)/(41-111) 119/70 mmHg (08/10 1500) SpO2:  [89 %-100 %] 93 % (08/10 1500) FiO2 (%):  [40 %-50 %] 50 % (08/10 1500) Physical Exam  Constitutional:  oriented to person, place, and time. Dishelved, ill appearing HENT: [place]/AT, PERRLA, no scleral icterus Mouth/Throat: Oropharynx is clear and very dry, poor dentition. No oropharyngeal exudate.  Cardiovascular: Normal rate,3.6 sm Pulmonary/Chest: Effort normal and breath sounds normal. No respiratory distress.  has no wheezes.  Neck supple, no nuchal rigidity Access i  PICC RUE Abdominal: Soft. Bowel  sounds are normal.  exhibits no distension. There is no tenderness.  Lymphadenopathy: no cervical adenopathy. No axillary adenopathy Neurological: alert and oriented to person, place, and time.  Skin: Skin is warm and dry. No rash noted. No erythema. Possible splinter hemorrhage on R hand fingernail Psychiatric: a normal mood and affect.  behavior is normal.  EXT no cce  LABS: Results for orders placed or performed during the hospital encounter of 11/23/14 (from the past 48 hour(s))  Troponin I     Status: Abnormal   Collection Time: 11/23/14  3:31 PM  Result Value Ref Range   Troponin I 0.84 (H) <0.031 ng/mL    Comment: RESULTS VERIFIED BY REPEAT TESTING READ BACK AND VERIFIED WITH CHRIS LARSON AT 1625 11/23/14.Marland KitchenMarland KitchenSMG        POSSIBLE  MYOCARDIAL ISCHEMIA. SERIAL TESTING RECOMMENDED.   Troponin I     Status: Abnormal   Collection Time: 11/23/14  9:55 PM  Result Value Ref Range   Troponin I 6.68 (H) <0.031 ng/mL    Comment: READ BACK AND VERIFIED BY DEL HOPKINS _0  11/23/14.Marland KitchenMarland KitchenAJO        POSSIBLE MYOCARDIAL ISCHEMIA. SERIAL TESTING RECOMMENDED.   Urinalysis complete, with microscopic ([facility] only)     Status: Abnormal   Collection Time: 11/23/14 11:30 PM  Result Value Ref Range   Color, Urine YELLOW (A) YELLOW   APPearance HAZY (A) CLEAR   Glucose, UA NEGATIVE NEGATIVE mg/dL   Bilirubin Urine NEGATIVE NEGATIVE   Ketones, ur NEGATIVE NEGATIVE mg/dL   Specific Gravity, Urine 1.029 1.005 - 1.030   Hgb urine dipstick NEGATIVE NEGATIVE   pH 5.0 5.0 - 8.0   Protein, ur NEGATIVE NEGATIVE mg/dL   Nitrite NEGATIVE NEGATIVE   Leukocytes, UA NEGATIVE NEGATIVE   RBC / HPF 0-5 0 - 5 RBC/hpf   WBC, UA 0-5 0 - 5 WBC/hpf   Bacteria, UA FEW (A) NONE SEEN   Squamous Epithelial / LPF 0-5 (A) NONE SEEN   Mucous PRESENT    Hyaline Casts, UA PRESENT   Troponin I     Status: Abnormal   Collection Time: 11/24/14  3:36 AM  Result Value Ref Range   Troponin I 8.43 (H) <0.031 ng/mL    Comment: READ BACK AND VERIFIED BY DEL HOPKINS _1  11/24/14.Marland KitchenMarland KitchenAJO        POSSIBLE MYOCARDIAL ISCHEMIA. SERIAL TESTING RECOMMENDED.   Basic metabolic panel     Status: Abnormal   Collection Time: 11/24/14  3:36 AM  Result Value Ref Range   Sodium 136 135 - 145 mmol/L   Potassium 4.2 3.5 - 5.1 mmol/L   Chloride 104 101 - 111 mmol/L   CO2 23 22 - 32 mmol/L   Glucose, Bld 143 (H) 65 - 99 mg/dL   BUN 27 (H) 6 - 20 mg/dL   Creatinine, Ser 1.12 (H) 0.44 - 1.00 mg/dL   Calcium 7.7 (L) 8.9 - 10.3 mg/dL   GFR calc non Af Amer 46 (L) >60 mL/min   GFR calc Af Amer 54 (L) >60 mL/min    Comment: (NOTE) The eGFR has been calculated using the CKD EPI equation. This calculation has not been validated in all clinical situations. eGFR's persistently <60  mL/min signify possible Chronic Kidney Disease.    Anion gap 9 5 - 15  CBC     Status: Abnormal   Collection Time: 11/24/14  3:36 AM  Result Value Ref Range   WBC 25.3 (H) 3.6 - 11.0 K/uL   RBC 3.48 (L) 3.80 -  5.20 MIL/uL   Hemoglobin 9.1 (L) 12.0 - 16.0 g/dL   HCT 28.9 (L) 35.0 - 47.0 %   MCV 83.0 80.0 - 100.0 fL   MCH 26.2 26.0 - 34.0 pg   MCHC 31.6 (L) 32.0 - 36.0 g/dL   RDW 16.2 (H) 11.5 - 14.5 %   Platelets 211 150 - 440 K/uL  Culture, blood (routine x 2)     Status: None (Preliminary result)   Collection Time: 11/24/14  7:07 AM  Result Value Ref Range   Specimen Description BLOOD BOTH 5ML    Special Requests Immunocompromised RIGHT HAND    Culture NO GROWTH 1 DAY    Report Status PENDING   Culture, blood (routine x 2)     Status: None (Preliminary result)   Collection Time: 11/24/14  7:12 AM  Result Value Ref Range   Specimen Description BLOOD BOTH 5ML    Special Requests Immunocompromised LEFT HAND    Culture  Setup Time      GRAM POSITIVE COCCI ANAEROBIC BOTTLE ONLY CRITICAL RESULT CALLED TO, READ BACK BY AND VERIFIED WITH: IRIS GUIDRY @ 8119 8.9.16 MPG    Culture GRAM POSITIVE COCCI ANAEROBIC BOTTLE ONLY     Report Status PENDING   APTT     Status: Abnormal   Collection Time: 11/24/14 11:48 AM  Result Value Ref Range   aPTT 42 (H) 24 - 36 seconds    Comment:        IF BASELINE aPTT IS ELEVATED, SUGGEST PATIENT RISK ASSESSMENT BE USED TO DETERMINE APPROPRIATE ANTICOAGULANT THERAPY.   Heparin level (unfractionated)     Status: Abnormal   Collection Time: 11/24/14 11:48 AM  Result Value Ref Range   Heparin Unfractionated 2.94 (H) 0.30 - 0.70 IU/mL  APTT     Status: Abnormal   Collection Time: 11/24/14  7:14 PM  Result Value Ref Range   aPTT 103 (H) 24 - 36 seconds    Comment:        IF BASELINE aPTT IS ELEVATED, SUGGEST PATIENT RISK ASSESSMENT BE USED TO DETERMINE APPROPRIATE ANTICOAGULANT THERAPY.   Glucose, capillary     Status: Abnormal    Collection Time: 11/25/14 12:23 AM  Result Value Ref Range   Glucose-Capillary 163 (H) 65 - 99 mg/dL  CBC     Status: Abnormal   Collection Time: 11/25/14  4:50 AM  Result Value Ref Range   WBC 19.3 (H) 3.6 - 11.0 K/uL   RBC 3.15 (L) 3.80 - 5.20 MIL/uL   Hemoglobin 8.4 (L) 12.0 - 16.0 g/dL   HCT 26.3 (L) 35.0 - 47.0 %   MCV 83.6 80.0 - 100.0 fL   MCH 26.7 26.0 - 34.0 pg   MCHC 32.0 32.0 - 36.0 g/dL   RDW 16.2 (H) 11.5 - 14.5 %   Platelets 163 150 - 440 K/uL  Heparin level (unfractionated)     Status: Abnormal   Collection Time: 11/25/14  4:50 AM  Result Value Ref Range   Heparin Unfractionated 1.34 (H) 0.30 - 0.70 IU/mL    Comment:        IF HEPARIN RESULTS ARE BELOW EXPECTED VALUES, AND PATIENT DOSAGE HAS BEEN CONFIRMED, SUGGEST FOLLOW UP TESTING OF ANTITHROMBIN III LEVELS.   Basic metabolic panel     Status: Abnormal   Collection Time: 11/25/14  4:50 AM  Result Value Ref Range   Sodium 139 135 - 145 mmol/L   Potassium 3.8 3.5 - 5.1 mmol/L   Chloride 108 101 -  111 mmol/L   CO2 25 22 - 32 mmol/L   Glucose, Bld 118 (H) 65 - 99 mg/dL   BUN 25 (H) 6 - 20 mg/dL   Creatinine, Ser 0.94 0.44 - 1.00 mg/dL   Calcium 8.1 (L) 8.9 - 10.3 mg/dL   GFR calc non Af Amer 57 (L) >60 mL/min   GFR calc Af Amer >60 >60 mL/min    Comment: (NOTE) The eGFR has been calculated using the CKD EPI equation. This calculation has not been validated in all clinical situations. eGFR's persistently <60 mL/min signify possible Chronic Kidney Disease.    Anion gap 6 5 - 15  APTT     Status: Abnormal   Collection Time: 11/25/14  4:50 AM  Result Value Ref Range   aPTT 60 (H) 24 - 36 seconds    Comment:        IF BASELINE aPTT IS ELEVATED, SUGGEST PATIENT RISK ASSESSMENT BE USED TO DETERMINE APPROPRIATE ANTICOAGULANT THERAPY.   Blood gas, arterial     Status: Abnormal (Preliminary result)   Collection Time: 11/25/14  1:45 PM  Result Value Ref Range   FIO2 0.99    pH, Arterial 7.42 7.350 - 7.450    pCO2 arterial 33 32.0 - 48.0 mmHg   pO2, Arterial 154 (H) 83.0 - 108.0 mmHg   Bicarbonate 21.4 21.0 - 28.0 mEq/L   Acid-base deficit 2.3 (H) 0.0 - 2.0 mmol/L   O2 Saturation 99.4 %   Patient temperature 37.0    Collection site RIGHT RADIAL    Sample type ARTERIAL DRAW    Allens test (pass/fail) POSITIVE (A) PASS   Mechanical Rate PENDING    No components found for: ESR, C REACTIVE PROTEIN MICRO: Recent Results (from the past 720 hour(s))  MRSA PCR Screening     Status: None   Collection Time: 11/23/14  3:18 PM  Result Value Ref Range Status   MRSA by PCR NEGATIVE NEGATIVE Final    Comment:        The GeneXpert MRSA Assay (FDA approved for NASAL specimens only), is one component of a comprehensive MRSA colonization surveillance program. It is not intended to diagnose MRSA infection nor to guide or monitor treatment for MRSA infections.   Culture, blood (routine x 2)     Status: None (Preliminary result)   Collection Time: 11/24/14  7:07 AM  Result Value Ref Range Status   Specimen Description BLOOD BOTH 5ML  Final   Special Requests Immunocompromised RIGHT HAND  Final   Culture NO GROWTH 1 DAY  Final   Report Status PENDING  Incomplete  Culture, blood (routine x 2)     Status: None (Preliminary result)   Collection Time: 11/24/14  7:12 AM  Result Value Ref Range Status   Specimen Description BLOOD BOTH 5ML  Final   Special Requests Immunocompromised LEFT HAND  Final   Culture  Setup Time   Final    GRAM POSITIVE COCCI ANAEROBIC BOTTLE ONLY CRITICAL RESULT CALLED TO, READ BACK BY AND VERIFIED WITH: IRIS GUIDRY @ 5885 8.9.16 MPG    Culture GRAM POSITIVE COCCI ANAEROBIC BOTTLE ONLY   Final   Report Status PENDING  Incomplete    IMAGING: Ct Chest Wo Contrast  11/23/2014   CLINICAL DATA:  Chest pain. Originally ordered for dissection, but the IV infiltrated and was evaluated by Dr. Martinique. No intravenous contrast is present within the chest.  EXAM: CT CHEST WITHOUT  CONTRAST  TECHNIQUE: Multidetector CT imaging of the chest was  performed following the standard protocol without IV contrast.  COMPARISON:  None.  FINDINGS: The central airways are patent. The lungs are clear. There is no pleural effusion or pneumothorax.  There are no pathologically enlarged axillary, hilar or mediastinal lymph nodes.  The heart size is enlarged. There is evidence of prior CABG. There is no pericardial effusion. The thoracic aorta is normal in caliber.  Review of bone windows demonstrates no focal lytic or sclerotic lesions.  Limited non-contrast images of the upper abdomen were obtained. The adrenal glands appear normal. There is a mild nodular contour of the liver as can be seen with hepatocellular disease.  IMPRESSION: 1. No acute disease of the chest. Aortic dissection cannot be evaluated on this examination secondary to lack of intravenous contrast.   Electronically Signed   By: Kathreen Devoid   On: 11/23/2014 22:30   Dg Chest Port 1 View  11/25/2014   CLINICAL DATA:  Congestive heart failure.  EXAM: PORTABLE CHEST - 1 VIEW  COMPARISON:  November 23, 2014.  FINDINGS: Stable cardiomegaly. Status post coronary artery bypass graft. Left-sided pacemaker is unchanged in position. Right-sided PICC line is unchanged with distal tip overlying expected position of the SVC. No pneumothorax is noted. Increased right upper lobe opacity is noted concerning for developing pneumonia or possibly edema. Stable central pulmonary vascular congestion is noted. Central pulmonary vascular congestion is noted increased bibasilar opacities are noted concerning for subsegmental atelectasis or possibly edema.  IMPRESSION: Increased bibasilar opacities are noted concerning for subsegmental atelectasis or possibly edema. Increased right upper lobe opacity is noted concerning for pneumonia or possibly edema. Continued radiographic follow-up is recommended.   Electronically Signed   By: Marijo Conception, M.D.   On: 11/25/2014  13:48   Dg Chest Port 1 View  11/23/2014   CLINICAL DATA:  74 year old female status post central line placement  EXAM: PORTABLE CHEST - 1 VIEW  COMPARISON:  Prior chest x-ray obtained earlier today  FINDINGS: New right upper extremity approach PICC. Catheter tip overlies the distal SVC in good position. Stable position of left subclavian approach cardiac rhythm maintenance device with leads projecting over the right atrium and right ventricle. Stable mild cardiomegaly. Patient is status post median sternotomy. Mild vascular congestion without overt edema. No evidence of pneumothorax or pleural effusion. No acute osseous abnormality.  IMPRESSION: The tip of the new right upper extremity PICC projects over the distal SVC.   Electronically Signed   By: Jacqulynn Cadet M.D.   On: 11/23/2014 21:28   Dg Chest Port 1 View  11/23/2014   CLINICAL DATA:  Chest pain, history of prior myocardial infarction and coronary bypass grafting  EXAM: PORTABLE CHEST - 1 VIEW  COMPARISON:  10/26/2014  FINDINGS: Cardiac shadow is at the upper limits of normal in size. A pacing device is again seen and stable. Postsurgical changes are seen. The lungs are well aerated bilaterally without focal infiltrate or sizable effusion.  IMPRESSION: No active disease.   Electronically Signed   By: Inez Catalina M.D.   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 female with history of sick sinus syndrome status  post pacemaker and atrial fibrillation on anticoagulation who presents 8/8 with palpitations and chest pain. Found to be in RVR  with HR in 150s.  She also had a leukocytosis. She was treated with amio and stabilized but then had repeat decompensation.  Since admission 1/2 bcx + GPC. She has had a fever to 102. WBC was elevated but decreasing some. She had a recent skin outbreak from sunscreen where she says her skin was all inflamed and she was treated with steroids. She does not report any purulence or boils with this outbreak and it has responded nicely to prednisone. She has not had any recent procedures or dental work but does have some ongoing poor dentition.  CT chest neg for PNA.  UA negative  I am concerned for endocarditis given the valvular heart disease, PPM,  Fever, bacteremia and possible splinter hemorrhage in setting of recent skin eruption.  Recommendations Repeat bcx x 2 Start vancomycin Check Echo Consider TEE  Thank you very much for allowing me to participate in the care of this patient. Please call with questions.   Cheral Marker. Ola Spurr, MD

## 2014-11-25 NOTE — Progress Notes (Signed)
Dr. Darvin Neighbours notified of patient's heart rate. Orders to push IV cardizem once and go ahead and give all scheduled am meds now and monitor patient.

## 2014-11-25 NOTE — Progress Notes (Signed)
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

## 2014-11-25 NOTE — Progress Notes (Signed)
[facility] Encounter Note  Patient: Ruth Hodges / Admit Date: 11/23/2014 / Date of Encounter: 11/25/2014, 8:29 AM   Subjective: Continued mild chest and back pain which appears to be correlating to intermittent episodes of atrial fibrillation now continued to be controlled with amiodarone  Review of Systems: Positive for: Chest and back pain Negative for: Vision change, hearing change, syncope, dizziness, nausea, vomiting,diarrhea, bloody stool, stomach pain, cough, congestion, diaphoresis, urinary frequency, urinary pain,skin lesions, skin rashes Others previously listed  Objective: Telemetry: Normal sinus rhythm Physical Exam: Blood pressure 154/81, pulse 155, temperature 98 F (36.7 C), temperature source Oral, resp. rate 20, height 5\' 5"  (1.651 m), weight 173 lb 12.8 oz (78.835 kg), SpO2 95 %. Body mass index is 28.92 kg/(m^2). General: Well developed, well nourished, in no acute distress. Head: Normocephalic, atraumatic, sclera non-icteric, no xanthomas, nares are without discharge. Neck: No apparent masses Lungs: Normal respirations with few wheezes, no rhonchi, basilar rales , no crackles   Heart: Regular rate and rhythm, normal S1 soft S2, 3 out of 6 right upper sternal border murmur, no rub, no gallop, PMI is normal size and placement, carotid upstroke normal with bruit, jugular venous pressure normal Abdomen: Soft, non-tender, non-distended with normoactive bowel sounds. No hepatosplenomegaly. Abdominal aorta is normal size without bruit Extremities: Trace edema, no clubbing, no cyanosis, no ulcers,  Peripheral: 2+ radial, 1 + femoral, 2+ dorsal pedal pulses Neuro: Alert and oriented. Moves all extremities spontaneously. Psych:  Responds to questions appropriately with a normal affect.   Intake/Output Summary (Last 24 hours) at 11/25/14 0829 Last data filed at 11/25/14 0700  Gross per 24 hour  Intake   1220 ml  Output   1550 ml  Net   -330 ml     Inpatient Medications:  . amiodarone  400 mg Oral Daily  . amitriptyline  100 mg Oral QHS  . digoxin  0.125 mg Oral Daily  . diltiazem      . diltiazem  10 mg Intravenous Once  . isosorbide mononitrate  30 mg Oral Daily  . levothyroxine  150 mcg Oral QAC breakfast  . predniSONE  50 mg Oral Q breakfast  . simvastatin  40 mg Oral QPM  . sodium chloride  10-40 mL Intracatheter Q12H  . traMADol  50 mg Oral TID  . vitamin C  250 mg Oral Daily   Infusions:  . sodium chloride 50 mL/hr at 11/24/14 1608  . heparin 1,050 Units/hr (11/25/14 0714)    Labs:  Recent Labs  11/24/14 0336 11/25/14 0450  NA 136 139  K 4.2 3.8  CL 104 108  CO2 23 25  GLUCOSE 143* 118*  BUN 27* 25*  CREATININE 1.12* 0.94  CALCIUM 7.7* 8.1*   No results for input(s): AST, ALT, ALKPHOS, BILITOT, PROT, ALBUMIN in the last 72 hours.  Recent Labs  11/23/14 1201 11/24/14 0336 11/25/14 0450  WBC 29.4* 25.3* 19.3*  NEUTROABS 24.1*  --   --   HGB 10.1* 9.1* 8.4*  HCT 32.4* 28.9* 26.3*  MCV 84.8 83.0 83.6  PLT 240 211 163    Recent Labs  11/23/14 1201 11/23/14 1531 11/23/14 2155 11/24/14 0336  TROPONINI 0.07* 0.84* 6.68* 8.43*   Invalid input(s): POCBNP No results for input(s): HGBA1C in the last 72 hours.   Weights: Filed Weights   11/23/14 1154 11/24/14 0441 11/24/14 1410  Weight: 172 lb 6.4 oz (78.2 kg) 171 lb 3.2 oz (77.656 kg) 173 lb 12.8 oz (78.835 kg)  Radiology/Studies:  Ct Chest Wo Contrast  11/23/2014   CLINICAL DATA:  Chest pain. Originally ordered for dissection, but the IV infiltrated and was evaluated by Dr. Martinique. No intravenous contrast is present within the chest.  EXAM: CT CHEST WITHOUT CONTRAST  TECHNIQUE: Multidetector CT imaging of the chest was performed following the standard protocol without IV contrast.  COMPARISON:  None.  FINDINGS: The central airways are patent. The lungs are clear. There is no pleural effusion or pneumothorax.  There are no pathologically  enlarged axillary, hilar or mediastinal lymph nodes.  The heart size is enlarged. There is evidence of prior CABG. There is no pericardial effusion. The thoracic aorta is normal in caliber.  Review of bone windows demonstrates no focal lytic or sclerotic lesions.  Limited non-contrast images of the upper abdomen were obtained. The adrenal glands appear normal. There is a mild nodular contour of the liver as can be seen with hepatocellular disease.  IMPRESSION: 1. No acute disease of the chest. Aortic dissection cannot be evaluated on this examination secondary to lack of intravenous contrast.   Electronically Signed   By: Kathreen Devoid   On: 11/23/2014 22:30   Dg Chest Port 1 View  11/23/2014   CLINICAL DATA:  74 year old female status post central line placement  EXAM: PORTABLE CHEST - 1 VIEW  COMPARISON:  Prior chest x-ray obtained earlier today  FINDINGS: New right upper extremity approach PICC. Catheter tip overlies the distal SVC in good position. Stable position of left subclavian approach cardiac rhythm maintenance device with leads projecting over the right atrium and right ventricle. Stable mild cardiomegaly. Patient is status post median sternotomy. Mild vascular congestion without overt edema. No evidence of pneumothorax or pleural effusion. No acute osseous abnormality.  IMPRESSION: The tip of the new right upper extremity PICC projects over the distal SVC.   Electronically Signed   By: Jacqulynn Cadet M.D.   On: 11/23/2014 21:28   Dg Chest Port 1 View  11/23/2014   CLINICAL DATA:  Chest pain, history of prior myocardial infarction and coronary bypass grafting  EXAM: PORTABLE CHEST - 1 VIEW  COMPARISON:  10/26/2014  FINDINGS: Cardiac shadow is at the upper limits of normal in size. A pacing device is again seen and stable. Postsurgical changes are seen. The lungs are well aerated bilaterally without focal infiltrate or sizable effusion.  IMPRESSION: No active disease.   Electronically Signed   By:  Inez Catalina M.D.   On: 11/23/2014 12:45   Dg Chest Port 1 View  10/26/2014   CLINICAL DATA:  Woke up this morning with chest pain at her sternum and shortness of breath.  EXAM: PORTABLE CHEST - 1 VIEW  COMPARISON:  10/14/2014  FINDINGS: Stable position of the left dual chamber cardiac pacemaker. Patient has median sternotomy wires and the most cephalad wire is broken. Lungs are clear without airspace disease or edema. Heart size is normal. Negative for a pneumothorax.  IMPRESSION: No acute chest findings.   Electronically Signed   By: Markus Daft M.D.   On: 10/26/2014 09:49     Assessment and Recommendation  74 y.o. female with acute onset of atrial fibrillation with rapid ventricular rate with the non-ST elevation myocardial infarction and known coronary artery disease status post previous coronary artery bypass graft now 6 having slight improvements with medication management as above 1. Abstain from anticoagulation at this time due to maintenance of normal sinus rhythm and possible further intervention from the cardiac standpoint and/or cardiac  catheterization. The patient did have one dose of anticoagulation on Monday evening increasing risk of bleeding with cardiac catheterization until Friday morning 2. Continue digoxin and beta blocker for heart rate control of atrial fibrillation and change amiodarone to oral dose of 400 mg each day 3. Continue heparin for risk reduction in stroke with atrial fibrillation and myocardial infarction  4. Echocardiogram for extent of myocardial infarction LV systolic dysfunction contributing to above 5. We have discussed possible cardiac catheterization for elevated troponin and non-ST elevation myocardial infarction with recurrent episodes of chest discomfort at this time. Patient understands risk and benefits of cardiac catheterization. This includes possibility of death stroke heart attack infection bleeding or blood clot. The patient is at low risk for conscious  sedation. 6. Cardiac catheterization on Friday due to dose of Eliquis on Monday evening for further evaluation cardiac abnormalities but reducing bleeding complication  Signed, Serafina Royals M.D. FACC

## 2014-11-25 NOTE — Progress Notes (Signed)
Patient's having chest discomfort and having episode of afib with HR as high at 141 noted.  Dr Jannifer Franklin notified of episode.  Medication ordered and medicated per MAR.  Patient's rhythm is now back to 60 atrial paced.  Pt resting comfortably at this time.  Will continue to monitor.  Lynnda Shields, RN

## 2014-11-25 NOTE — Progress Notes (Signed)
Notified Dr. Darvin Neighbours about cardiac catherization not performed due to respiratory difficulties in cath lab (patient could not tolerate lying flat) but this RN does not hear a difference in lung sounds (still diminished) and patient on non-rebreather but patient had refused to let RN try a different oxygen source and had high anxiety levels at times. Cardiologist already ordered lasix and chest x-ray. MD ordered ABG.

## 2014-11-25 NOTE — Progress Notes (Addendum)
ANTICOAGULATION CONSULT NOTE - Follow up [facility] for Heparin Indication: chest pain/ACS  Allergies  Allergen Reactions  . Atorvastatin Other (See Comments)  . Codeine   . Ceftin  [Cefuroxime Axetil] Other (See Comments)    Patient Measurements: Height: 5\' 5"  (165.1 cm) Weight: 173 lb 12.8 oz (78.835 kg) (upon transfer) IBW/kg (Calculated) : 57 Heparin Dosing Weight: 73 kg   Vital Signs: Temp: 98.9 F (37.2 C) (08/10 1630) Temp Source: Oral (08/10 1630) BP: 121/74 mmHg (08/10 1800) Pulse Rate: 135 (08/10 1800)  Labs:  Recent Labs  11/23/14 1201 11/23/14 1531 11/23/14 2155 11/24/14 0336 11/24/14 1148 11/24/14 1914 11/25/14 0450 11/25/14 1902  HGB 10.1*  --   --  9.1*  --   --  8.4*  --   HCT 32.4*  --   --  28.9*  --   --  26.3*  --   PLT 240  --   --  211  --   --  163  --   APTT 29  --   --   --  42* 103* 60* 50*  LABPROT 16.1*  --   --   --   --   --   --   --   INR 1.27  --   --   --   --   --   --   --   HEPARINUNFRC  --   --   --   --  2.94*  --  1.34*  --   CREATININE 1.19*  --   --  1.12*  --   --  0.94  --   TROPONINI 0.07* 0.84* 6.68* 8.43*  --   --   --   --     Estimated Creatinine Clearance: 52.8 mL/min (by C-G formula based on Cr of 0.94).   Medical History: Past Medical History  Diagnosis Date  . Anxiety   . Depression   . A-fib   . Arthritis   . Hypertension   . Hyperlipidemia   . Thyroid disease   . CAD (coronary artery disease)     Medications:  Scheduled:  . amitriptyline  100 mg Oral QHS  . digoxin  0.125 mg Oral Daily  . furosemide  40 mg Intravenous BID  . isosorbide mononitrate  30 mg Oral Daily  . levothyroxine  150 mcg Oral QAC breakfast  . predniSONE  50 mg Oral Q breakfast  . rosuvastatin  10 mg Oral Daily  . sodium chloride  10-40 mL Intracatheter Q12H  . traMADol  50 mg Oral TID  . vancomycin  750 mg Intravenous Q12H  . vitamin C  250 mg Oral Daily    Assessment: Patient being treated for  NSTEMI. Previously on apixaban. Last dose of apixaban was yesterday per MD.  Goal of Therapy:  Heparin level 0.3-0.7 units/ml Monitor platelets by anticoagulation protocol: Yes   Plan:   Base line APTT and Heparin level ordered per protocol since patient's last dose of Apixaban was <72 hours ago. If baseline heparin level <0.1 can use heparin level to monitor. If Heparin level >0.1 use APTT to follow.   Give 4000 units bolus x 1 Start heparin infusion at 950 units/hr Continue to monitor H&H and platelets    First aPTT at 1914 = 103, within goal range Will continue current rate of infusion (RN confirms heparin running at 9.5 ml/hr).  Next aPTT for confirmation at 0130. No bleeding or line issues noted per RN. CBC  and HL in AM.   8/10 AM anti-Xa 1.34.  aPTT 60. Increase to 1050 units/hr and recheck at 13>00  8/10: APTT level resulted @ 50,which is subtherapeutic. Will increase heparin gtt to 1150 units/hr. Will order next APTT for 6 hours later @ 02:00 on 8/11.  8/11 0200 aPTT 57. Anti-Xa 0.56. Increase to 1250 units/hr and recheck in 6 hours.  Sim Boast, PharmD, BCPS  11/26/2014

## 2014-11-25 NOTE — Progress Notes (Signed)
Pt had been placed on a non-breather due to reporting a similar inability to breathe with similar presentation upon transfer to unit with 10/10 chest pain. RNs had placed on NRB mask due to patient preference for mask and chest pain. Upon chest pain resolution, patient had refused to let staff change her back to nasal cannula or venturi mask. Finally around 1115, patient let RN try a venturi mask but patient reported plastic uncomfortable and wanted NRB mask (that was "molded to [her] face better"). Then patient began to report feeling short of breath and anxious and began screaming. RN and NT pulled patient up and began to fan her face and try to calm her down and assess lungs/breathing. Another RN paged MD for me.  Another RN paged MD as I was tied up in room with patient who was incredibly anxious and screaming about how she couldn't breathe with O2 sats in upper 90s to 100 on NRB, lung sounds still diminished. MD order PO ativan and RN gave. Patient calmer, chest pain resolved after either ativan dose or nitro dose RN gave when patient was reporting difficulty breathing and felt like her chest had "pressure".  Patient being taken down to cath lab for cardiac catherization.

## 2014-11-25 NOTE — Progress Notes (Signed)
Spoke to Dr. Nehemiah Massed on the phone about patient's continued chest pain. Had just spoken with Dr. Alva Garnet in rounds about patient's condition. Dr. Nehemiah Massed confirmed 5 mg metoprolol dose and will check back in on patient's condition to decide if patient can receive cardiac catherization.

## 2014-11-25 NOTE — Progress Notes (Signed)
At [facility], Patient c/o chest pain and palpitations.  Patient found to be in afib RVR.  Dr Jannifer Franklin notified and orders given.  Medicated per MAR.  At approximately 0100 patient back in Atrial Paced rhythm with rate of 60. Pt reports relief at this time.  Pt resting comfortably at this time.  Will continue to monitor.  Lynnda Shields, RN

## 2014-11-25 NOTE — Progress Notes (Signed)
Patient crying out with chest pain. Per Dr. Darvin Neighbours - give PRN morphine then MD to place transfer orders to CCU for cardizem drip

## 2014-11-25 NOTE — Progress Notes (Signed)
[facility] Cardiology [facility] Encounter Note  Patient: Ruth Hodges / Admit Date: 11/23/2014 / Date of Encounter: 11/25/2014, 12:26 PM   Subjective: The patient had acute decompensation with severe shortness of breath and recurrent atrial fibrillation with rapid ventricular rate. She also has had an episode of chest discomfort, substernal, for which he is improving with heart rate control  Review of Systems: Positive for: Shortness of breath and chest discomfort Negative for: Vision change, hearing change, syncope, dizziness, nausea, vomiting,diarrhea, bloody stool, stomach pain, cough, congestion,   urinary frequency, urinary pain,skin lesions, skin rashes Others previously listed  Objective: Telemetry: Atrial fibrillation with rapid ventricular rate Physical Exam: Blood pressure 141/89, pulse 57, temperature 99.2 F (37.3 C), temperature source Oral, resp. rate 31, height 5\' 5"  (1.651 m), weight 173 lb 12.8 oz (78.835 kg), SpO2 100 %. Body mass index is 28.92 kg/(m^2). General: Well developed, well nourished, in no acute distress. Head: Normocephalic, atraumatic, sclera non-icteric, no xanthomas, nares are without discharge. Neck: No apparent masses Lungs: Increased respirations with no wheezes, diffuse rhonchi, basilar rales , basilar crackles   Heart: Irregular rate and rhythm, normal S1 S2, 2-3+ aortic murmur, no rub, no gallop, PMI is normal size and placement, carotid upstroke normal without bruit, jugular venous pressure normal Abdomen: Soft, non-tender, non-distended with normoactive bowel sounds. No hepatosplenomegaly. Abdominal aorta is normal size without bruit Extremities: Trace edema, no clubbing, no cyanosis, no ulcers,  Peripheral: 2+ radial, 2+ femoral, 2+ dorsal pedal pulses Neuro: Alert and oriented. Moves all extremities spontaneously. Psych:  Responds to questions appropriately with a normal affect.   Intake/Output Summary (Last 24 hours) at 11/25/14 1226 Last  data filed at 11/25/14 0840  Gross per 24 hour  Intake    630 ml  Output   1675 ml  Net  -1045 ml    Inpatient Medications:  . [MAR Hold] amitriptyline  100 mg Oral QHS  . [MAR Hold] digoxin  0.125 mg Oral Daily  . furosemide  40 mg Intravenous BID  . [MAR Hold] isosorbide mononitrate  30 mg Oral Daily  . [MAR Hold] levothyroxine  150 mcg Oral QAC breakfast  . [MAR Hold] predniSONE  50 mg Oral Q breakfast  . [MAR Hold] simvastatin  40 mg Oral QPM  . [MAR Hold] sodium chloride  10-40 mL Intracatheter Q12H  . [MAR Hold] traMADol  50 mg Oral TID  . [MAR Hold] vitamin C  250 mg Oral Daily   Infusions:  . sodium chloride 50 mL/hr at 11/25/14 1050  . amiodarone 60 mg/hr (11/25/14 1002)  . amiodarone    . heparin 1,050 Units/hr (11/25/14 1100)    Labs:  Recent Labs  11/24/14 0336 11/25/14 0450  NA 136 139  K 4.2 3.8  CL 104 108  CO2 23 25  GLUCOSE 143* 118*  BUN 27* 25*  CREATININE 1.12* 0.94  CALCIUM 7.7* 8.1*   No results for input(s): AST, ALT, ALKPHOS, BILITOT, PROT, ALBUMIN in the last 72 hours.  Recent Labs  11/23/14 1201 11/24/14 0336 11/25/14 0450  WBC 29.4* 25.3* 19.3*  NEUTROABS 24.1*  --   --   HGB 10.1* 9.1* 8.4*  HCT 32.4* 28.9* 26.3*  MCV 84.8 83.0 83.6  PLT 240 211 163    Recent Labs  11/23/14 1201 11/23/14 1531 11/23/14 2155 11/24/14 0336  TROPONINI 0.07* 0.84* 6.68* 8.43*   Invalid input(s): POCBNP No results for input(s): HGBA1C in the last 72 hours.   Weights: Filed Weights   11/23/14 1154  11/24/14 0441 11/24/14 1410  Weight: 172 lb 6.4 oz (78.2 kg) 171 lb 3.2 oz (77.656 kg) 173 lb 12.8 oz (78.835 kg)     Radiology/Studies:  Ct Chest Wo Contrast  11/23/2014   CLINICAL DATA:  Chest pain. Originally ordered for dissection, but the IV infiltrated and was evaluated by Dr. Martinique. No intravenous contrast is present within the chest.  EXAM: CT CHEST WITHOUT CONTRAST  TECHNIQUE: Multidetector CT imaging of the chest was performed  following the standard protocol without IV contrast.  COMPARISON:  None.  FINDINGS: The central airways are patent. The lungs are clear. There is no pleural effusion or pneumothorax.  There are no pathologically enlarged axillary, hilar or mediastinal lymph nodes.  The heart size is enlarged. There is evidence of prior CABG. There is no pericardial effusion. The thoracic aorta is normal in caliber.  Review of bone windows demonstrates no focal lytic or sclerotic lesions.  Limited non-contrast images of the upper abdomen were obtained. The adrenal glands appear normal. There is a mild nodular contour of the liver as can be seen with hepatocellular disease.  IMPRESSION: 1. No acute disease of the chest. Aortic dissection cannot be evaluated on this examination secondary to lack of intravenous contrast.   Electronically Signed   By: Kathreen Devoid   On: 11/23/2014 22:30   Dg Chest Port 1 View  11/23/2014   CLINICAL DATA:  74 year old female status post central line placement  EXAM: PORTABLE CHEST - 1 VIEW  COMPARISON:  Prior chest x-ray obtained earlier today  FINDINGS: New right upper extremity approach PICC. Catheter tip overlies the distal SVC in good position. Stable position of left subclavian approach cardiac rhythm maintenance device with leads projecting over the right atrium and right ventricle. Stable mild cardiomegaly. Patient is status post median sternotomy. Mild vascular congestion without overt edema. No evidence of pneumothorax or pleural effusion. No acute osseous abnormality.  IMPRESSION: The tip of the new right upper extremity PICC projects over the distal SVC.   Electronically Signed   By: Jacqulynn Cadet M.D.   On: 11/23/2014 21:28   Dg Chest Port 1 View  11/23/2014   CLINICAL DATA:  Chest pain, history of prior myocardial infarction and coronary bypass grafting  EXAM: PORTABLE CHEST - 1 VIEW  COMPARISON:  10/26/2014  FINDINGS: Cardiac shadow is at the upper limits of normal in size. A pacing  device is again seen and stable. Postsurgical changes are seen. The lungs are well aerated bilaterally without focal infiltrate or sizable effusion.  IMPRESSION: No active disease.   Electronically Signed   By: Inez Catalina M.D.   On: 11/23/2014 12:45     Assessment and Recommendation  74 y.o. female with known cardiovascular disease with new onset atrial fibrillation and valvular with acute non-ST elevation myocardial infarction, now with some decompensation and recurrent atrial fibrillation consistent with acute systolic dysfunction, congestive heart failure. 1. Chest x-ray to assess for heart failure and pulmonary edema 2. Intravenous Lasix 40 mg for pulmonary edema and hypoxia. 3. Oxygen with nonrebreather. 4. Defer cardiac catheterization at this time due to above issues. 5. Continue amiodarone drip for heart rate control and use beta blocker as well for heart rate control and possible maintenance of normal sinus rhythm. 6. Further diagnostic tests. Adjustment in medication management. After about  Signed, Serafina Royals M.D. FACC

## 2014-11-25 NOTE — Progress Notes (Signed)
Clarified with Dr. Darvin Neighbours on the phone about whether or not to give 40 mg of lasix dose he put in, MD ordered RN to wait until 18:00 when regular twice daily dose would be given.

## 2014-11-25 NOTE — Progress Notes (Signed)
Paged Cardiologist, Dr. Nehemiah Massed about patient's persistant chest pain even after 3 doses of nitroglycerin sublingual tablets. Morphine already given on 2A at roughly 09:15, not available again for 3 hours after that.

## 2014-11-25 NOTE — Progress Notes (Signed)
Dr. Nehemiah Massed MD called for update on patient's condition. No chest pain at this time, MD going to review possible cath in the next hour, will call RN back.

## 2014-11-25 NOTE — Progress Notes (Signed)
ANTIBIOTIC CONSULT NOTE - INITIAL  Pharmacy Consult for Vancomycin Indication: endocarditis  Allergies  Allergen Reactions  . Atorvastatin Other (See Comments)  . Codeine   . Ceftin  [Cefuroxime Axetil] Other (See Comments)    Patient Measurements: Height: 5\' 5"  (165.1 cm) Weight: 173 lb 12.8 oz (78.835 kg) (upon transfer) IBW/kg (Calculated) : 57   Vital Signs: Temp: 99.2 F (37.3 C) (08/10 1000) Temp Source: Oral (08/10 1000) BP: 119/70 mmHg (08/10 1500) Pulse Rate: 48 (08/10 1500) Intake/Output from previous day: 08/09 0701 - 08/10 0700 In: 1220 [P.O.:1070; I.V.:150] Out: 1550 [Urine:1550] Intake/Output from this shift: Total I/O In: 623 [P.O.:370; I.V.:253] Out: 125 [Urine:125]  Labs:  Recent Labs  11/23/14 1201 11/24/14 0336 11/25/14 0450  WBC 29.4* 25.3* 19.3*  HGB 10.1* 9.1* 8.4*  PLT 240 211 163  CREATININE 1.19* 1.12* 0.94   Estimated Creatinine Clearance: 52.8 mL/min (by C-G formula based on Cr of 0.94). No results for input(s): VANCOTROUGH, VANCOPEAK, VANCORANDOM, GENTTROUGH, GENTPEAK, GENTRANDOM, TOBRATROUGH, TOBRAPEAK, TOBRARND, AMIKACINPEAK, AMIKACINTROU, AMIKACIN in the last 72 hours.   Microbiology: Recent Results (from the past 720 hour(s))  MRSA PCR Screening     Status: None   Collection Time: 11/23/14  3:18 PM  Result Value Ref Range Status   MRSA by PCR NEGATIVE NEGATIVE Final    Comment:        The GeneXpert MRSA Assay (FDA approved for NASAL specimens only), is one component of a comprehensive MRSA colonization surveillance program. It is not intended to diagnose MRSA infection nor to guide or monitor treatment for MRSA infections.   Culture, blood (routine x 2)     Status: None (Preliminary result)   Collection Time: 11/24/14  7:07 AM  Result Value Ref Range Status   Specimen Description BLOOD BOTH 5ML  Final   Special Requests Immunocompromised RIGHT HAND  Final   Culture NO GROWTH 1 DAY  Final   Report Status PENDING   Incomplete  Culture, blood (routine x 2)     Status: None (Preliminary result)   Collection Time: 11/24/14  7:12 AM  Result Value Ref Range Status   Specimen Description BLOOD BOTH 5ML  Final   Special Requests Immunocompromised LEFT HAND  Final   Culture  Setup Time   Final    GRAM POSITIVE COCCI ANAEROBIC BOTTLE ONLY CRITICAL RESULT CALLED TO, READ BACK BY AND VERIFIED WITH: IRIS GUIDRY @ 4287 8.9.16 MPG    Culture GRAM POSITIVE COCCI ANAEROBIC BOTTLE ONLY   Final   Report Status PENDING  Incomplete    Medical History: Past Medical History  Diagnosis Date  . Anxiety   . Depression   . A-fib   . Arthritis   . Hypertension   . Hyperlipidemia   . Thyroid disease   . CAD (coronary artery disease)     Medications:  Scheduled:  . amitriptyline  100 mg Oral QHS  . digoxin  0.125 mg Oral Daily  . furosemide  40 mg Intravenous BID  . furosemide  40 mg Intravenous Once  . isosorbide mononitrate  30 mg Oral Daily  . levothyroxine  150 mcg Oral QAC breakfast  . predniSONE  50 mg Oral Q breakfast  . rosuvastatin  10 mg Oral Daily  . sodium chloride  10-40 mL Intracatheter Q12H  . traMADol  50 mg Oral TID  . vancomycin  750 mg Intravenous Once  . vancomycin  750 mg Intravenous Q12H  . vitamin C  250 mg Oral Daily  Infusions:  . amiodarone 30 mg/hr (11/25/14 1539)  . heparin 1,050 Units/hr (11/25/14 1100)   Assessment: Pharmacy consulted to dose vancomycin in 74 yo female with fever, leukocytosis and bacteremia initiated on vancomycin for possible bacteremia.    Goal of Therapy:  Vancomycin trough level 15-20 mcg/ml  Plan:  Will start patient on vancomycin 750mg  IV Q12hr for goal trough of 15-20. Will give patient stacked dose at 2200. Will obtain trough prior to am dose on 8/12.    Pharmacy will continue to monitor and adjust per consult.    Evann Erazo L 11/25/2014,4:16 PM

## 2014-11-25 NOTE — Progress Notes (Signed)
ANTICOAGULATION CONSULT NOTE - Follow up [facility] for Heparin Indication: chest pain/ACS  Allergies  Allergen Reactions  . Atorvastatin Other (See Comments)  . Codeine   . Ceftin  [Cefuroxime Axetil] Other (See Comments)    Patient Measurements: Height: 5\' 5"  (165.1 cm) Weight: 173 lb 12.8 oz (78.835 kg) (upon transfer) IBW/kg (Calculated) : 57 Heparin Dosing Weight: 73 kg   Vital Signs: Temp: 98 F (36.7 C) (08/10 0332) Temp Source: Oral (08/10 0332) BP: 147/70 mmHg (08/10 0332) Pulse Rate: 122 (08/10 0332)  Labs:  Recent Labs  11/23/14 1201 11/23/14 1531 11/23/14 2155 11/24/14 0336 11/24/14 1148 11/24/14 1914 11/25/14 0450  HGB 10.1*  --   --  9.1*  --   --  8.4*  HCT 32.4*  --   --  28.9*  --   --  26.3*  PLT 240  --   --  211  --   --  163  APTT 29  --   --   --  42* 103* 60*  LABPROT 16.1*  --   --   --   --   --   --   INR 1.27  --   --   --   --   --   --   HEPARINUNFRC  --   --   --   --  2.94*  --  1.34*  CREATININE 1.19*  --   --  1.12*  --   --  0.94  TROPONINI 0.07* 0.84* 6.68* 8.43*  --   --   --     Estimated Creatinine Clearance: 52.8 mL/min (by C-G formula based on Cr of 0.94).   Medical History: Past Medical History  Diagnosis Date  . Anxiety   . Depression   . A-fib   . Arthritis   . Hypertension   . Hyperlipidemia   . Thyroid disease   . CAD (coronary artery disease)     Medications:  Scheduled:  . amiodarone  200 mg Oral Daily  . amitriptyline  100 mg Oral QHS  . digoxin  0.125 mg Oral Daily  . isosorbide mononitrate  30 mg Oral Daily  . levothyroxine  150 mcg Oral QAC breakfast  . predniSONE  50 mg Oral Q breakfast  . simvastatin  40 mg Oral QPM  . sodium chloride  10-40 mL Intracatheter Q12H  . traMADol  50 mg Oral TID  . vitamin C  250 mg Oral Daily    Assessment: Patient being treated for NSTEMI. Previously on apixaban. Last dose of apixaban was yesterday per MD.  Goal of Therapy:  Heparin level  0.3-0.7 units/ml Monitor platelets by anticoagulation protocol: Yes   Plan:   Base line APTT and Heparin level ordered per protocol since patient's last dose of Apixaban was <72 hours ago. If baseline heparin level <0.1 can use heparin level to monitor. If Heparin level >0.1 use APTT to follow.   Give 4000 units bolus x 1 Start heparin infusion at 950 units/hr Continue to monitor H&H and platelets    First aPTT at 1914 = 103, within goal range Will continue current rate of infusion (RN confirms heparin running at 9.5 ml/hr).  Next aPTT for confirmation at 0130. No bleeding or line issues noted per RN. CBC and HL in AM.   8/10 AM anti-Xa 1.34.  aPTT 60. Increase to 1050 units/hr and recheck at 13>00   Briyonna Omara S 11/25/2014,6:41 AM

## 2014-11-25 NOTE — Care Management Important Message (Signed)
Important Message  Patient Details  Name: Mialani Reicks MRN: [id number] Date of Birth: 03/05/1941   Medicare Important Message Given:  Yes-second notification given    Juliann Pulse A Allmond 11/25/2014, 1:17 PM

## 2014-11-25 NOTE — Consult Note (Signed)
PULMONARY / CRITICAL CARE MEDICINE   Name: Heiley Shaikh MRN: [id number] DOB: [birth year]    ADMISSION DATE:  11/23/2014 CONSULTATION DATE:  11/25/14  REFERRING MD :  Dr. Darvin Neighbours  CHIEF COMPLAINT:  Chest pain\SOB\palpitations  INITIAL PRESENTATION: 74 y.o. female with a known history of sick sinus syndrome status post pacemaker and atrial fibrillation on anticoagulation who presents with above complaint. Patient woke up this morning with palpitations and chest pain radiating to her neck. She continues to have chest pain and palpitations. In the emergency room she was noted to have atrial fibrillation and RVR heart rates were in the 150s. She was started on amiodarone drip due to low blood pressure she has now converted to normal sinus rhythm. She was seen and evaluated by cardiology while in the emergency room. She had a recent echocardiogram which showed normal ejection fraction with some mild to moderate valvular heart disease. Today patient noted to be in Afib with RVR, went to the cath lab,while their, complained of chest pain and sob, noted to be started on NRB, came back up the to ICU.  Now NRB weaned to [place] 4-6L.    STUDIES:  ECHO 6/29 >>EF 60-65, moderate TR  SIGNIFICANT EVENTS: 11/25/14 noted to be in Afib with RVR, became hypoxic requiring NRB   PAST MEDICAL HISTORY :  Past Medical History  Diagnosis Date  . Anxiety   . Depression   . A-fib   . Arthritis   . Hypertension   . Hyperlipidemia   . Thyroid disease   . CAD (coronary artery disease)    Past Surgical History  Procedure Laterality Date  . Pacemaker insertion    . Cardiac catheterization    . Bladder surgery    . Cholecystectomy    . Appendectomy    . Coronary artery bypass graft    . Vaginal hysterectomy  1972    total, ovaries removed in 1983   Prior to Admission medications   Medication Sig Start Date End Date Taking? Authorizing Provider  amitriptyline (ELAVIL) 100 MG tablet Take 100 mg by mouth at  bedtime.  08/31/14  Yes Historical Provider, MD  apixaban (ELIQUIS) 5 MG TABS tablet Take 5 mg by mouth 2 (two) times daily.  08/31/14  Yes Historical Provider, MD  Biotin 2500 MCG CAPS Take 5,000 mcg by mouth daily.  08/31/14  Yes Historical Provider, MD  Cholecalciferol (VITAMIN D3) 1000 UNITS CAPS Take 1 capsule by mouth daily.  08/31/14  Yes Historical Provider, MD  cyanocobalamin 100 MCG tablet Take 100 mcg by mouth daily.  08/31/14  Yes Historical Provider, MD  digoxin (LANOXIN) 0.125 MG tablet Take 0.125 mg by mouth. Pt takes digitek 04/29/14  Yes Historical Provider, MD  felodipine (PLENDIL) 10 MG 24 hr tablet Take 10 mg by mouth daily.  08/31/14  Yes Historical Provider, MD  fluocinonide cream (LIDEX) 1.32 % Apply 1 application topically 3 (three) times daily. To rash except face 11/18/14  Yes Carmon Ginsberg, PA  isosorbide mononitrate (IMDUR) 30 MG 24 hr tablet Take 30 mg by mouth daily. 10/27/14 10/27/15 Yes Historical Provider, MD  levothyroxine (SYNTHROID, LEVOTHROID) 150 MCG tablet Take 150 mcg by mouth daily.  04/29/14  Yes Historical Provider, MD  losartan (COZAAR) 100 MG tablet Take 100 mg by mouth daily.  04/29/14  Yes Historical Provider, MD  nitroGLYCERIN (NITROSTAT) 0.4 MG SL tablet Place 0.4 mg under the tongue. 10/27/14 10/27/15 Yes Historical Provider, MD  Omega 3-6-9 CAPS Take 1 capsule by mouth daily.  08/31/14  Yes Historical Provider, MD  predniSONE (DELTASONE) 10 MG tablet Taper daily as follows: 6 pills, 5, 4, 3, 2, 1 Patient taking differently: Take 10-60 mg by mouth See admin instructions. Taper daily as follows: 6 pills, 5, 4, 3, 2, 1 11/20/14  Yes Carmon Ginsberg, PA  simvastatin (ZOCOR) 40 MG tablet Take 40 mg by mouth every evening.  08/31/14  Yes Historical Provider, MD  torsemide (DEMADEX) 20 MG tablet Take 40 mg by mouth daily.  08/31/14  Yes Historical Provider, MD  traMADol Veatrice Bourbon) 50 MG tablet take 1 tablet by mouth three times a day 11/12/14  Yes Margarita Rana, MD  vitamin C  (ASCORBIC ACID) 250 MG tablet Take 250 mg by mouth daily.   Yes Historical Provider, MD   Allergies  Allergen Reactions  . Atorvastatin Other (See Comments)  . Codeine   . Ceftin  [Cefuroxime Axetil] Other (See Comments)    FAMILY HISTORY:  Family History  Problem Relation Age of Onset  . Heart attack Father   . CVA Sister   . Diabetes Brother   . Heart attack Brother    SOCIAL HISTORY:  reports that she has never smoked. She does not have any smokeless tobacco history on file. She reports that she does not drink alcohol or use illicit drugs.  REVIEW OF SYSTEMS:   CONSTITUTIONAL: No fever, fatigue or weakness.  EYES: No blurred or double vision.  EARS, NOSE, AND THROAT: No tinnitus or ear pain.  RESPIRATORY: No cough, positive occasional shortness of breath, no wheezing or hemoptysis.  CARDIOVASCULAR: Positive chest pain, no orthopnea or edema.  GASTROINTESTINAL: No nausea, vomiting, diarrhea or abdominal pain.  GENITOURINARY: No dysuria, hematuria.  ENDOCRINE: No polyuria, nocturia,  HEMATOLOGY: No anemia, positive easy bruising no bleeding SKIN: No rash or lesion. MUSCULOSKELETAL: No joint pain or arthritis.  NEUROLOGIC: No tingling, numbness, weakness.  PSYCHIATRY: Positive anxiety and depression.   SUBJECTIVE: sitting in bed, eating dinner, mild respiratory distress, but comfortable.   VITAL SIGNS: Temp:  [97.9 F (36.6 C)-99.2 F (37.3 C)] 98.9 F (37.2 C) (08/10 1630) Pulse Rate:  [48-168] 122 (08/10 1636) Resp:  [18-33] 23 (08/10 1636) BP: (98-154)/(41-111) 114/55 mmHg (08/10 1600) SpO2:  [89 %-100 %] 96 % (08/10 1636) FiO2 (%):  [40 %-50 %] 50 % (08/10 1600) HEMODYNAMICS:   VENTILATOR SETTINGS: Vent Mode:  [-]  FiO2 (%):  [40 %-50 %] 50 % INTAKE / OUTPUT:  Intake/Output Summary (Last 24 hours) at 11/25/14 1820 Last data filed at 11/25/14 1637  Gross per 24 hour  Intake 1214.75 ml  Output   3650 ml  Net -2435.25 ml    PHYSICAL  EXAMINATION: Constitutional: oriented to person, place, and time. Dishelved, ill appearing, mild respiratory distress HENT: [place]/AT, PERRLA, no scleral icterus Mouth/Throat: Oropharynx is clear and very dry, poor dentition. No oropharyngeal exudate.  Cardiovascular: Normal rate,3.6 sm Pulmonary/Chest: Effort normal and breath sounds normal. Mild use of accessory muscles, very mild JVD, dec BS at the bases Neck supple, no nuchal rigidity Access i PICC RUE Abdominal: Soft. Bowel sounds are normal. exhibits no distension. There is no tenderness.  Lymphadenopathy: no cervical adenopathy. No axillary adenopathy Neurological: alert and oriented to person, place, and time.  Skin: Skin is warm and dry. No rash noted. No erythema. Possible splinter hemorrhage on R hand fingernail Psychiatric: a normal mood and affect. behavior is normal.   LABS:  CBC  Recent Labs Lab 11/23/14 1201 11/24/14 0336 11/25/14 0450  WBC 29.4*  25.3* 19.3*  HGB 10.1* 9.1* 8.4*  HCT 32.4* 28.9* 26.3*  PLT 240 211 163   Coag's  Recent Labs Lab 11/23/14 1201 11/24/14 1148 11/24/14 1914 11/25/14 0450  APTT 29 42* 103* 60*  INR 1.27  --   --   --    BMET  Recent Labs Lab 11/23/14 1201 11/24/14 0336 11/25/14 0450  NA 135 136 139  K 3.2* 4.2 3.8  CL 102 104 108  CO2 18* 23 25  BUN 21* 27* 25*  CREATININE 1.19* 1.12* 0.94  GLUCOSE 91 143* 118*   Electrolytes  Recent Labs Lab 11/23/14 1201 11/24/14 0336 11/25/14 0450  CALCIUM 8.2* 7.7* 8.1*   Sepsis Markers No results for input(s): LATICACIDVEN, PROCALCITON, O2SATVEN in the last 168 hours. ABG  Recent Labs Lab 11/25/14 1345  PHART 7.42  PCO2ART 33  PO2ART 154*   Liver Enzymes No results for input(s): AST, ALT, ALKPHOS, BILITOT, ALBUMIN in the last 168 hours. Cardiac Enzymes  Recent Labs Lab 11/23/14 1531 11/23/14 2155 11/24/14 0336  TROPONINI 0.84* 6.68* 8.43*   Glucose  Recent Labs Lab 11/23/14 1517 11/25/14 0023   GLUCAP 109* 163*    Imaging Dg Chest Port 1 View  11/25/2014   CLINICAL DATA:  Congestive heart failure.  EXAM: PORTABLE CHEST - 1 VIEW  COMPARISON:  November 23, 2014.  FINDINGS: Stable cardiomegaly. Status post coronary artery bypass graft. Left-sided pacemaker is unchanged in position. Right-sided PICC line is unchanged with distal tip overlying expected position of the SVC. No pneumothorax is noted. Increased right upper lobe opacity is noted concerning for developing pneumonia or possibly edema. Stable central pulmonary vascular congestion is noted. Central pulmonary vascular congestion is noted increased bibasilar opacities are noted concerning for subsegmental atelectasis or possibly edema.  IMPRESSION: Increased bibasilar opacities are noted concerning for subsegmental atelectasis or possibly edema. Increased right upper lobe opacity is noted concerning for pneumonia or possibly edema. Continued radiographic follow-up is recommended.   Electronically Signed   By: Marijo Conception, M.D.   On: 11/25/2014 13:48     ASSESSMENT / PLAN:  PULMONARY  A:Respiratory distress Hypoxia  P:   Secondary to Afib with RVR - cont with rate control Reviewed CT chest and CXR, mostly vascular congestion, cont with diuresis montior I\O closely, avoid fluid overload Maintain saturations >88% Anxiety control Currently on nasal canula 4-6L Incentive spirometry  CARDIOVASCULAR A: Afib with RVR P:  Cont with amiodarone Cont with rate controlled Cardiology following  RENAL A:  Increased creatinine P:   Cont with gentle diuresis Maintain balance I\O Monitor lytes.    HEMATOLOGIC A:  Anemia  leukocytosis P:  Monitor H\H Transfuse per ICU protocol Follow cultures ID following  INFECTIOUS A: leukocytosis (improving) P:   Recent treatment with prednisone for skin out break BCx2 - GPC 1 out 2 cx ID following - started on Vanc  Abx: Vanc, Start date - 8/10  ENDOCRINE A:  Monitor glucose  levels P:   ICU hyper\hypoglycemic protocol  NEUROLOGIC  RASS goal: 0   I have personally obtained a history, examined the patient, evaluated laboratory and imaging results, formulated the assessment and plan and placed orders. CRITICAL CARE: The patient is critically ill with multiple organ systems failure and requires high complexity decision making for assessment and support, frequent evaluation and titration of therapies, application of advanced monitoring technologies and extensive interpretation of multiple databases. Critical Care Time devoted to patient care services described in this note is 45 minutes.  Vilinda Boehringer, MD [facility] Pager [phone] (please enter 7 [phone]).     11/25/2014, 6:20 PM

## 2014-11-25 NOTE — Progress Notes (Signed)
[place] [facility] [place] NAME: Ruth Hodges    MR#:  [id number]  DATE OF BIRTH:  [birth year]  SUBJECTIVE:  CHIEF COMPLAINT:   Chief Complaint  Patient presents with  . Chest Pain   Overnight afib with RVR with 2 boluses of cardizem. Now patient is complaining of chest pain with radiation to left arm. Afib with RVR 150s. REVIEW OF SYSTEMS:   CONSTITUTIONAL: No fever. Fatigue. EYES: No blurred or double vision.  EARS, NOSE, AND THROAT: No tinnitus or ear pain.  RESPIRATORY: No cough, positive occasional shortness of breath, no wheezing or hemoptysis.  CARDIOVASCULAR: Positive chest pain, no orthopnea or edema.  GASTROINTESTINAL: No nausea, vomiting, diarrhea or abdominal pain.  GENITOURINARY: No dysuria, hematuria.  ENDOCRINE: No polyuria, nocturia,  HEMATOLOGY: No anemia, positive easy bruising no bleeding SKIN: No rash or lesion. MUSCULOSKELETAL: No joint pain or arthritis.  NEUROLOGIC: No tingling, numbness, weakness.  PSYCHIATRY: Positive anxiety and depression.   ROS  DRUG ALLERGIES:   Allergies  Allergen Reactions  . Atorvastatin Other (See Comments)  . Codeine   . Ceftin  [Cefuroxime Axetil] Other (See Comments)    VITALS:  Blood pressure 149/85, pulse 153, temperature 98 F (36.7 C), temperature source Oral, resp. rate 32, height 5\' 5"  (1.651 m), weight 78.835 kg (173 lb 12.8 oz), SpO2 92 %.  PHYSICAL EXAMINATION:  GENERAL: 74 y.o.-year-old patient lying in the bed with severe distress.   EYES: Pupils equal, round, reactive to light and accommodation. No scleral icterus. Extraocular muscles intact.  HEENT: Head atraumatic, normocephalic. Oropharynx and nasopharynx clear.  NECK: Supple, no jugular venous distention. No thyroid enlargement, no tenderness.  LUNGS: Normal breath sounds bilaterally, no wheezing, rales,rhonchi or crepitation. No use of accessory muscles of respiration.  CARDIOVASCULAR:  S1 S2, 2/6 SEM no rubs, or gallops. Irregular , tachycardia ABDOMEN: Soft, nontender, nondistended. Bowel sounds present. No organomegaly or mass.  EXTREMITIES: No pedal edema, cyanosis, or clubbing.  NEUROLOGIC: Cranial nerves II through XII are grossly intact. No focal deficits. PSYCHIATRIC: The patient is sleepy as did not had good sleep. But easily arousable and oriented x 3. Anxious SKIN: No obvious rash, lesion, or ulcer.   Physical Exam LABORATORY PANEL:   CBC  Recent Labs Lab 11/25/14 0450  WBC 19.3*  HGB 8.4*  HCT 26.3*  PLT 163   ------------------------------------------------------------------------------------------------------------------  Chemistries   Recent Labs Lab 11/25/14 0450  NA 139  K 3.8  CL 108  CO2 25  GLUCOSE 118*  BUN 25*  CREATININE 0.94  CALCIUM 8.1*   ------------------------------------------------------------------------------------------------------------------  Cardiac Enzymes  Recent Labs Lab 11/23/14 2155 11/24/14 0336  TROPONINI 6.68* 8.43*   ------------------------------------------------------------------------------------------------------------------  RADIOLOGY:  Ct Chest Wo Contrast  11/23/2014   CLINICAL DATA:  Chest pain. Originally ordered for dissection, but the IV infiltrated and was evaluated by Dr. [place]. No intravenous contrast is present within the chest.  EXAM: CT CHEST WITHOUT CONTRAST  TECHNIQUE: Multidetector CT imaging of the chest was performed following the standard protocol without IV contrast.  COMPARISON:  None.  FINDINGS: The central airways are patent. The lungs are clear. There is no pleural effusion or pneumothorax.  There are no pathologically enlarged axillary, hilar or mediastinal lymph nodes.  The heart size is enlarged. There is evidence of prior CABG. There is no pericardial effusion. The thoracic aorta is normal in caliber.  Review of bone windows demonstrates no focal lytic or sclerotic  lesions.  Limited non-contrast images of  the upper abdomen were obtained. The adrenal glands appear normal. There is a mild nodular contour of the liver as can be seen with hepatocellular disease.  IMPRESSION: 1. No acute disease of the chest. Aortic dissection cannot be evaluated on this examination secondary to lack of intravenous contrast.   Electronically Signed   By: Kathreen Devoid   On: 11/23/2014 22:30   Dg Chest Port 1 View  11/23/2014   CLINICAL DATA:  74 year old female status post central line placement  EXAM: PORTABLE CHEST - 1 VIEW  COMPARISON:  Prior chest x-ray obtained earlier today  FINDINGS: New right upper extremity approach PICC. Catheter tip overlies the distal SVC in good position. Stable position of left subclavian approach cardiac rhythm maintenance device with leads projecting over the right atrium and right ventricle. Stable mild cardiomegaly. Patient is status post median sternotomy. Mild vascular congestion without overt edema. No evidence of pneumothorax or pleural effusion. No acute osseous abnormality.  IMPRESSION: The tip of the new right upper extremity PICC projects over the distal SVC.   Electronically Signed   By: Jacqulynn Cadet M.D.   On: 11/23/2014 21:28   Dg Chest Port 1 View  11/23/2014   CLINICAL DATA:  Chest pain, history of prior myocardial infarction and coronary bypass grafting  EXAM: PORTABLE CHEST - 1 VIEW  COMPARISON:  10/26/2014  FINDINGS: Cardiac shadow is at the upper limits of normal in size. A pacing device is again seen and stable. Postsurgical changes are seen. The lungs are well aerated bilaterally without focal infiltrate or sizable effusion.  IMPRESSION: No active disease.   Electronically Signed   By: Inez Catalina M.D.   On: 11/23/2014 12:45    ASSESSMENT AND PLAN:   * Atrial fibrillation RVR Now back in Afib with RVR. No response to 2 doses of cardizem overnight. Gave STAT dose cardizem IV now but still tachycardic. Transfer to ICU on  Amiodarone drip. Eliquis held for cath.  * NSTEMI   Troponin rise to 8   Heparin drip. Cath per Dr. Nehemiah Massed.  * Leukocytosis: Patient was recently started on steroids due to a rash that she had from sunscreen. She will continue on the prednisone we will continue monitor CBC. She has no active signs of infection at this time.  blood cx showing GPC. Likely contaminant. Will request ID to see.  * Depression and anxiety: Patient continue on outpatient medications.  All the records are reviewed and case discussed with Care Management/Social Workerr. Management plans discussed with the patient, family and they are in agreement.  CODE STATUS: limited ( DNI)  TOTAL CRITICAL CARE TIME TAKING CARE OF THIS PATIENT: 35 minutes.     Hillary Bow R M.D on 11/25/2014   Between 7am to 6pm - Pager - [phone]  After 6pm go to [email or web address] - password [email or web address] [facility]  Office  [phone]  CC: Primary care physician; Wilhemena Durie, MD

## 2014-11-26 ENCOUNTER — Inpatient Hospital Stay
Admit: 2014-11-26 | Discharge: 2014-11-26 | Disposition: A | Discharge status: Home / Self Care | Payer: Medicare Other | Attending: Infectious Diseases | Admitting: Infectious Diseases

## 2014-11-26 LAB — CBC
HCT: 28.6 % — ABNORMAL LOW (ref 35.0–47.0)
Hemoglobin: 9.2 g/dL — ABNORMAL LOW (ref 12.0–16.0)
MCH: 26.7 pg (ref 26.0–34.0)
MCHC: 32.3 g/dL (ref 32.0–36.0)
MCV: 82.5 fL (ref 80.0–100.0)
PLATELETS: 184 10*3/uL (ref 150–440)
RBC: 3.47 MIL/uL — ABNORMAL LOW (ref 3.80–5.20)
RDW: 16.1 % — ABNORMAL HIGH (ref 11.5–14.5)
WBC: 13.9 10*3/uL — ABNORMAL HIGH (ref 3.6–11.0)

## 2014-11-26 LAB — BASIC METABOLIC PANEL
Anion gap: 9 (ref 5–15)
BUN: 21 mg/dL — ABNORMAL HIGH (ref 6–20)
CO2: 26 mmol/L (ref 22–32)
Calcium: 8.1 mg/dL — ABNORMAL LOW (ref 8.9–10.3)
Chloride: 104 mmol/L (ref 101–111)
Creatinine, Ser: 0.73 mg/dL (ref 0.44–1.00)
GFR calc Af Amer: >60 mL/min (ref >60)
GFR calc non Af Amer: >60 mL/min (ref >60)
GLUCOSE: 103 mg/dL — AB (ref 65–99)
POTASSIUM: 3.2 mmol/L — AB (ref 3.5–5.1)
Sodium: 139 mmol/L (ref 135–145)

## 2014-11-26 LAB — HEPARIN LEVEL (UNFRACTIONATED)
HEPARIN UNFRACTIONATED: 0.56 [IU]/mL (ref 0.30–0.70)
Heparin Unfractionated: 0.54 IU/mL (ref 0.30–0.70)

## 2014-11-26 LAB — APTT
APTT: 57 s — AB (ref 24–36)
aPTT: 60 seconds — ABNORMAL HIGH (ref 24–36)
aPTT: 62 seconds — ABNORMAL HIGH (ref 24–36)

## 2014-11-26 MED ORDER — POTASSIUM CHLORIDE CRYS ER 20 MEQ PO TBCR
40.0000 meq | EXTENDED_RELEASE_TABLET | ORAL | Status: AC
  Administered 2014-11-26 (×2): 40 meq via ORAL
  Filled 2014-11-26 (×2): qty 2

## 2014-11-26 MED ORDER — POTASSIUM CHLORIDE CRYS ER 20 MEQ PO TBCR
40.0000 meq | EXTENDED_RELEASE_TABLET | ORAL | Status: AC
  Administered 2014-11-26 – 2014-11-27 (×2): 40 meq via ORAL
  Filled 2014-11-26 (×2): qty 2

## 2014-11-26 MED ORDER — DIGOXIN 0.25 MG/ML IJ SOLN
0.2500 mg | Freq: Once | INTRAMUSCULAR | Status: AC
  Administered 2014-11-26: 0.25 mg via INTRAVENOUS
  Filled 2014-11-26: qty 2

## 2014-11-26 NOTE — Progress Notes (Signed)
Pt currently in afibb/RVR heart rate ranging between 115 to 150's notified Dr Ubaldo Glassing given orders to administer 0.25 mg iv push digoxin once

## 2014-11-26 NOTE — Care Management Note (Signed)
Case Management Note  Patient Details  Name: Ruth Hodges MRN: [id number] Date of Birth: Jun 20, 1940  Subjective/Objective:                  Met with patient to discuss discharge planning. She is from home alone. Her husband is at [facility]. He has used [facility] home health in the past; she has never needed home health services. She is new to [facility]2. She has no DME at home. Her daughter Lattie Haw lives in [place] [phone]. She uses [employer] for O2.   Action/Plan: RNCM will continue to follow.   Expected Discharge Date:                  Expected Discharge Plan:     In-House Referral:     Discharge planning Services  CM Consult  Post Acute Care Choice:  Home Health Choice offered to:  Patient  DME Arranged:    DME Agency:     HH Arranged:    [place] Agency:     Status of Service:  In process, will continue to follow  Medicare Important Message Given:  Yes-second notification given Date Medicare IM Given:    Medicare IM give by:    Date Additional Medicare IM Given:    Additional Medicare Important Message give by:     If discussed at [place] of Stay Meetings, dates discussed:    Additional Comments:  Marshell Garfinkel, RN 11/26/2014, 1:24 PM

## 2014-11-26 NOTE — Progress Notes (Signed)
ANTIBIOTIC CONSULT NOTE - INITIAL  Pharmacy Consult for Vancomycin Indication: endocarditis  Allergies  Allergen Reactions  . Atorvastatin Other (See Comments)  . Codeine   . Ceftin  [Cefuroxime Axetil] Other (See Comments)    Patient Measurements: Height: 5\' 5"  (165.1 cm) Weight: 173 lb 12.8 oz (78.835 kg) (upon transfer) IBW/kg (Calculated) : 57   Vital Signs: Temp: 98.3 F (36.8 C) (08/11 0929) Temp Source: Oral (08/11 0929) BP: 105/75 mmHg (08/11 1005) Pulse Rate: 59 (08/11 1005) Intake/Output from previous day: 08/10 0701 - 08/11 0700 In: 2995.6 [P.O.:850; I.V.:1845.6; IV Piggyback:300] Out: 6200 [Urine:6200] Intake/Output from this shift: Total I/O In: -  Out: 875 [Urine:875]  Labs:  Recent Labs  11/24/14 0336 11/25/14 0450 11/26/14 0506  WBC 25.3* 19.3* 13.9*  HGB 9.1* 8.4* 9.2*  PLT 211 163 184  CREATININE 1.12* 0.94 0.73   Estimated Creatinine Clearance: 62.1 mL/min (by C-G formula based on Cr of 0.73). No results for input(s): VANCOTROUGH, VANCOPEAK, VANCORANDOM, GENTTROUGH, GENTPEAK, GENTRANDOM, TOBRATROUGH, TOBRAPEAK, TOBRARND, AMIKACINPEAK, AMIKACINTROU, AMIKACIN in the last 72 hours.   Microbiology: Recent Results (from the past 720 hour(s))  MRSA PCR Screening     Status: None   Collection Time: 11/23/14  3:18 PM  Result Value Ref Range Status   MRSA by PCR NEGATIVE NEGATIVE Final    Comment:        The GeneXpert MRSA Assay (FDA approved for NASAL specimens only), is one component of a comprehensive MRSA colonization surveillance program. It is not intended to diagnose MRSA infection nor to guide or monitor treatment for MRSA infections.   Culture, blood (routine x 2)     Status: None (Preliminary result)   Collection Time: 11/24/14  7:07 AM  Result Value Ref Range Status   Specimen Description BLOOD BOTH 5ML  Final   Special Requests Immunocompromised RIGHT HAND  Final   Culture NO GROWTH 2 DAYS  Final   Report Status PENDING   Incomplete  Culture, blood (routine x 2)     Status: None (Preliminary result)   Collection Time: 11/24/14  7:12 AM  Result Value Ref Range Status   Specimen Description BLOOD BOTH 5ML  Final   Special Requests Immunocompromised LEFT HAND  Final   Culture  Setup Time   Final    GRAM POSITIVE COCCI ANAEROBIC BOTTLE ONLY CRITICAL RESULT CALLED TO, READ BACK BY AND VERIFIED WITH: IRIS GUIDRY @ 2305 8.9.16 MPG    Culture   Final    VIRIDANS STREPTOCOCCUS IDENTIFICATION AND SUSCEPTIBILITIES TO FOLLOW PER DR. FITZGERALD    Report Status PENDING  Incomplete    Medical History: Past Medical History  Diagnosis Date  . Anxiety   . Depression   . A-fib   . Arthritis   . Hypertension   . Hyperlipidemia   . Thyroid disease   . CAD (coronary artery disease)     Medications:  Scheduled:  . amitriptyline  100 mg Oral QHS  . digoxin  0.125 mg Oral Daily  . furosemide  40 mg Intravenous BID  . isosorbide mononitrate  30 mg Oral Daily  . levothyroxine  150 mcg Oral QAC breakfast  . predniSONE  50 mg Oral Q breakfast  . rosuvastatin  10 mg Oral Daily  . sodium chloride  10-40 mL Intracatheter Q12H  . traMADol  50 mg Oral TID  . vancomycin  750 mg Intravenous Q12H  . vitamin C  250 mg Oral Daily   Infusions:  . amiodarone 30 mg/hr (11/26/14  0400)  . heparin 1,250 Units/hr (11/26/14 0802)   Assessment: Pharmacy consulted to dose vancomycin in 74 yo female with fever, leukocytosis and bacteremia initiated on vancomycin for possible bacteremia.    Goal of Therapy:  Vancomycin trough level 15-20 mcg/ml  Plan:  Will continue patient on vancomycin 750mg  IV Q12hr for goal trough of 15-20. Will give patient stacked dose at 2200. Will obtain trough prior to am dose on 8/12.    Pharmacy will continue to monitor and adjust per consult.    Chae Shuster G 11/26/2014,12:37 PM

## 2014-11-26 NOTE — Progress Notes (Signed)
ANTICOAGULATION CONSULT NOTE - Follow up [place] for Heparin Indication: chest pain/ACS  Allergies  Allergen Reactions  . Atorvastatin Other (See Comments)  . Codeine   . Ceftin  [Cefuroxime Axetil] Other (See Comments)    Patient Measurements: Height: 5\' 5"  (165.1 cm) Weight: 173 lb 12.8 oz (78.835 kg) (upon transfer) IBW/kg (Calculated) : 57 Heparin Dosing Weight: 73 kg   Vital Signs: Temp: 98.3 F (36.8 C) (08/11 0929) Temp Source: Oral (08/11 0929) BP: 105/75 mmHg (08/11 1005) Pulse Rate: 59 (08/11 1005)  Labs:  Recent Labs  11/23/14 1531 11/23/14 2155  11/24/14 0336  11/25/14 0450 11/25/14 1902 11/26/14 0506 11/26/14 1158  HGB  --   --   < > 9.1*  --  8.4*  --  9.2*  --   HCT  --   --   --  28.9*  --  26.3*  --  28.6*  --   PLT  --   --   --  211  --  163  --  184  --   APTT  --   --   --   --   < > 60* 50* 57* 60*  HEPARINUNFRC  --   --   --   --   < > 1.34*  --  0.56 0.54  CREATININE  --   --   --  1.12*  --  0.94  --  0.73  --   TROPONINI 0.84* 6.68*  --  8.43*  --   --   --   --   --   < > = values in this interval not displayed.  Estimated Creatinine Clearance: 62.1 mL/min (by C-G formula based on Cr of 0.73).   Medical History: Past Medical History  Diagnosis Date  . Anxiety   . Depression   . A-fib   . Arthritis   . Hypertension   . Hyperlipidemia   . Thyroid disease   . CAD (coronary artery disease)     Medications:  Scheduled:  . amitriptyline  100 mg Oral QHS  . digoxin  0.125 mg Oral Daily  . furosemide  40 mg Intravenous BID  . isosorbide mononitrate  30 mg Oral Daily  . levothyroxine  150 mcg Oral QAC breakfast  . predniSONE  50 mg Oral Q breakfast  . rosuvastatin  10 mg Oral Daily  . sodium chloride  10-40 mL Intracatheter Q12H  . traMADol  50 mg Oral TID  . vancomycin  750 mg Intravenous Q12H  . vitamin C  250 mg Oral Daily    Assessment: Patient being treated for NSTEMI. Previously on apixaban. Last  dose of apixaban was on 8/8 at 2200.  APTT at 1200 of 60 Goal APTT: 68-109   Patient currently ordered   Goal of Therapy:  Heparin level 0.3-0.7 units/ml Monitor platelets by anticoagulation protocol: Yes   Plan:  Will increased heparin drip rate to 1350 units/hr as APTT is below goal range of 68-109.  Will recheck APTT in 6 hours at 1900 to continue to assess dosing.    Will recheck HL, APTT, and CBC with AM labs.  If APTT and HL correlate at this time, may dose using HL only.   Pharmacy will continue to follow.  Murrell Converse, PharmD Clinical Pharmacist 11/26/2014

## 2014-11-26 NOTE — Progress Notes (Signed)
*  PRELIMINARY RESULTS* Echocardiogram 2D Echocardiogram has been performed.  Ruth Hodges 11/26/2014, 10:07 AM

## 2014-11-26 NOTE — Progress Notes (Signed)
[facility] Cardiology [facility] Encounter Note  Patient: Ruth Hodges / Admit Date: 11/23/2014 / Date of Encounter: 11/26/2014, 8:05 AM   Subjective: The patient had acute decompensation with severe shortness of breath and recurrent atrial fibrillation with rapid ventricular rate yesterday with improvements after reinstatement of amiodarone drip and now remaining in normal sinus rhythm with excellent heart rate control and no further cardiac symptoms  Review of Systems: Positive for: Shortness of breath and chest discomfort now resolving with heart rate control Negative for: Vision change, hearing change, syncope, dizziness, nausea, vomiting,diarrhea, bloody stool, stomach pain, cough, congestion,   urinary frequency, urinary pain,skin lesions, skin rashes Others previously listed  Objective: Telemetry: Normal sinus rhythm Physical Exam: Blood pressure 122/75, pulse 126, temperature 98.4 F (36.9 C), temperature source Oral, resp. rate 25, height 5\' 5"  (1.651 m), weight 173 lb 12.8 oz (78.835 kg), SpO2 90 %. Body mass index is 28.92 kg/(m^2). General: Well developed, well nourished, in no acute distress. Head: Normocephalic, atraumatic, sclera non-icteric, no xanthomas, nares are without discharge. Neck: No apparent masses Lungs: Increased respirations with no wheezes, diffuse rhonchi, basilar rales , basilar crackles   Heart: Regular rate and rhythm, normal S1 S2, 2-3+ aortic murmur, no rub, no gallop, PMI is normal size and placement, carotid upstroke normal without bruit, jugular venous pressure normal Abdomen: Soft, non-tender, non-distended with normoactive bowel sounds. No hepatosplenomegaly. Abdominal aorta is normal size without bruit Extremities: Trace edema, no clubbing, no cyanosis, no ulcers,  Peripheral: 2+ radial, 2+ femoral, 2+ dorsal pedal pulses Neuro: Alert and oriented. Moves all extremities spontaneously. Psych:  Responds to questions appropriately with a normal  affect.   Intake/Output Summary (Last 24 hours) at 11/26/14 0805 Last data filed at 11/26/14 0600  Gross per 24 hour  Intake 1995.55 ml  Output   6200 ml  Net -4204.45 ml    Inpatient Medications:  . amitriptyline  100 mg Oral QHS  . digoxin  0.125 mg Oral Daily  . furosemide  40 mg Intravenous BID  . isosorbide mononitrate  30 mg Oral Daily  . levothyroxine  150 mcg Oral QAC breakfast  . potassium chloride  40 mEq Oral 6 times per day  . predniSONE  50 mg Oral Q breakfast  . rosuvastatin  10 mg Oral Daily  . sodium chloride  10-40 mL Intracatheter Q12H  . traMADol  50 mg Oral TID  . vancomycin  750 mg Intravenous Q12H  . vitamin C  250 mg Oral Daily   Infusions:  . amiodarone 30 mg/hr (11/26/14 0400)  . heparin 1,250 Units/hr (11/26/14 0600)    Labs:  Recent Labs  11/25/14 0450 11/26/14 0506  NA 139 139  K 3.8 3.2*  CL 108 104  CO2 25 26  GLUCOSE 118* 103*  BUN 25* 21*  CREATININE 0.94 0.73  CALCIUM 8.1* 8.1*   No results for input(s): AST, ALT, ALKPHOS, BILITOT, PROT, ALBUMIN in the last 72 hours.  Recent Labs  11/23/14 1201  11/25/14 0450 11/26/14 0506  WBC 29.4*  < > 19.3* 13.9*  NEUTROABS 24.1*  --   --   --   HGB 10.1*  < > 8.4* 9.2*  HCT 32.4*  < > 26.3* 28.6*  MCV 84.8  < > 83.6 82.5  PLT 240  < > 163 184  < > = values in this interval not displayed.  Recent Labs  11/23/14 1201 11/23/14 1531 11/23/14 2155 11/24/14 0336  TROPONINI 0.07* 0.84* 6.68* 8.43*   Invalid input(s):  POCBNP No results for input(s): HGBA1C in the last 72 hours.   Weights: Filed Weights   11/23/14 1154 11/24/14 0441 11/24/14 1410  Weight: 172 lb 6.4 oz (78.2 kg) 171 lb 3.2 oz (77.656 kg) 173 lb 12.8 oz (78.835 kg)     Radiology/Studies:  Ct Chest Wo Contrast  11/23/2014   CLINICAL DATA:  Chest pain. Originally ordered for dissection, but the IV infiltrated and was evaluated by Dr. [place]. No intravenous contrast is present within the chest.  EXAM: CT CHEST  WITHOUT CONTRAST  TECHNIQUE: Multidetector CT imaging of the chest was performed following the standard protocol without IV contrast.  COMPARISON:  None.  FINDINGS: The central airways are patent. The lungs are clear. There is no pleural effusion or pneumothorax.  There are no pathologically enlarged axillary, hilar or mediastinal lymph nodes.  The heart size is enlarged. There is evidence of prior CABG. There is no pericardial effusion. The thoracic aorta is normal in caliber.  Review of bone windows demonstrates no focal lytic or sclerotic lesions.  Limited non-contrast images of the upper abdomen were obtained. The adrenal glands appear normal. There is a mild nodular contour of the liver as can be seen with hepatocellular disease.  IMPRESSION: 1. No acute disease of the chest. Aortic dissection cannot be evaluated on this examination secondary to lack of intravenous contrast.   Electronically Signed   By: Kathreen Devoid   On: 11/23/2014 22:30   Dg Chest Port 1 View  11/25/2014   CLINICAL DATA:  Congestive heart failure.  EXAM: PORTABLE CHEST - 1 VIEW  COMPARISON:  November 23, 2014.  FINDINGS: Stable cardiomegaly. Status post coronary artery bypass graft. Left-sided pacemaker is unchanged in position. Right-sided PICC line is unchanged with distal tip overlying expected position of the SVC. No pneumothorax is noted. Increased right upper lobe opacity is noted concerning for developing pneumonia or possibly edema. Stable central pulmonary vascular congestion is noted. Central pulmonary vascular congestion is noted increased bibasilar opacities are noted concerning for subsegmental atelectasis or possibly edema.  IMPRESSION: Increased bibasilar opacities are noted concerning for subsegmental atelectasis or possibly edema. Increased right upper lobe opacity is noted concerning for pneumonia or possibly edema. Continued radiographic follow-up is recommended.   Electronically Signed   By: Marijo Conception, M.D.   On:  11/25/2014 13:48   Dg Chest Port 1 View  11/23/2014   CLINICAL DATA:  74 year old female status post central line placement  EXAM: PORTABLE CHEST - 1 VIEW  COMPARISON:  Prior chest x-ray obtained earlier today  FINDINGS: New right upper extremity approach PICC. Catheter tip overlies the distal SVC in good position. Stable position of left subclavian approach cardiac rhythm maintenance device with leads projecting over the right atrium and right ventricle. Stable mild cardiomegaly. Patient is status post median sternotomy. Mild vascular congestion without overt edema. No evidence of pneumothorax or pleural effusion. No acute osseous abnormality.  IMPRESSION: The tip of the new right upper extremity PICC projects over the distal SVC.   Electronically Signed   By: Jacqulynn Cadet M.D.   On: 11/23/2014 21:28   Dg Chest Port 1 View  11/23/2014   CLINICAL DATA:  Chest pain, history of prior myocardial infarction and coronary bypass grafting  EXAM: PORTABLE CHEST - 1 VIEW  COMPARISON:  10/26/2014  FINDINGS: Cardiac shadow is at the upper limits of normal in size. A pacing device is again seen and stable. Postsurgical changes are seen. The lungs are well aerated bilaterally  without focal infiltrate or sizable effusion.  IMPRESSION: No active disease.   Electronically Signed   By: Inez Catalina M.D.   On: 11/23/2014 12:45     Assessment and Recommendation  74 y.o. female with known cardiovascular disease with new onset atrial fibrillation and valvular with acute non-ST elevation myocardial infarction, now with some decompensation and recurrent atrial fibrillation consistent with acute systolic dysfunction, congestive heart failure now improving at this time with the amiodarone drip and Lasix. 1.  Continue amiodarone drip until today and changed to oral medication management. 400 mg of amiodarone maintenance of normal sinus rhythm and reduction of symptoms 2. Continue heparin for further risk reduction in stroke  with atrial fibrillation status post myocardial infarction 3. Continue Lasix and changed to oral today 4. Further consideration of cardiac catheterization tomorrow after stabilization today for further evaluation of coronary artery disease and reasons for acute non-ST elevation myocardial infarction. Patient understands all risks and benefits of cardiac catheterization. This includes possibility of death stroke heart attack infection bleeding or blood clot. Patient is at for conscious sedation. 5. Dr. Ubaldo Glassing to perform catheter and follow-up tomorrow  Signed, Serafina Royals M.D. FACC

## 2014-11-26 NOTE — Progress Notes (Addendum)
[facility] [place] NAME: Ruth Hodges    MR#:  [id number]  DATE OF BIRTH:  28-May-1940  SUBJECTIVE:  CHIEF COMPLAINT:   Chief Complaint  Patient presents with  . Chest Pain   Admitted for Afib, NSTEMI. Acute decompensation with Afib RVR and CHF 11/25/2014. Today no CP. Still has Afib with RVR. On 4-5 L O2.  REVIEW OF SYSTEMS:   CONSTITUTIONAL: No fever. Positive  Fatigue. EYES: No blurred or double vision.  EARS, NOSE, AND THROAT: No tinnitus or ear pain.  RESPIRATORY: No cough, positive occasional shortness of breath, no wheezing or hemoptysis.  CARDIOVASCULAR: No chest pain, no orthopnea or edema.  GASTROINTESTINAL: No nausea, vomiting, diarrhea or abdominal pain.  GENITOURINARY: No dysuria, hematuria.  ENDOCRINE: No polyuria, nocturia,  HEMATOLOGY: No anemia, positive easy bruising no bleeding SKIN: No rash or lesion. MUSCULOSKELETAL: No joint pain or arthritis.  NEUROLOGIC: No tingling, numbness, weakness.  PSYCHIATRY: Positive anxiety and depression.   ROS  DRUG ALLERGIES:   Allergies  Allergen Reactions  . Atorvastatin Other (See Comments)  . Codeine   . Ceftin  [Cefuroxime Axetil] Other (See Comments)    VITALS:  Blood pressure 122/75, pulse 59, temperature 98.4 F (36.9 C), temperature source Oral, resp. rate 20, height 5\' 5"  (1.651 m), weight 78.835 kg (173 lb 12.8 oz), SpO2 98 %.  PHYSICAL EXAMINATION:  GENERAL: 74 y.o.-year-old patient lying in the bed. EYES: Pupils equal, round, reactive to light and accommodation. No scleral icterus. Extraocular muscles intact.  HEENT: Head atraumatic, normocephalic. Oropharynx and nasopharynx clear.  NECK: Supple, no jugular venous distention. No thyroid enlargement, no tenderness.  LUNGS: Normal breath sounds bilaterally, no wheezing, rales,rhonchi or crepitation. No use of accessory muscles of respiration.  CARDIOVASCULAR: S1 S2, 2/6 SEM no rubs, or  gallops. Irregular , tachycardia ABDOMEN: Soft, nontender, nondistended. Bowel sounds present. No organomegaly or mass.  EXTREMITIES: No pedal edema, cyanosis, or clubbing.  NEUROLOGIC: Cranial nerves II through XII are grossly intact. No focal deficits. PSYCHIATRIC: The patient is sleepy as did not had good sleep. But easily arousable and oriented x 3. Anxious SKIN: No obvious rash, lesion, or ulcer.   Physical Exam LABORATORY PANEL:   CBC  Recent Labs Lab 11/26/14 0506  WBC 13.9*  HGB 9.2*  HCT 28.6*  PLT 184   ------------------------------------------------------------------------------------------------------------------  Chemistries   Recent Labs Lab 11/26/14 0506  NA 139  K 3.2*  CL 104  CO2 26  GLUCOSE 103*  BUN 21*  CREATININE 0.73  CALCIUM 8.1*   ------------------------------------------------------------------------------------------------------------------  Cardiac Enzymes  Recent Labs Lab 11/23/14 2155 11/24/14 0336  TROPONINI 6.68* 8.43*   ------------------------------------------------------------------------------------------------------------------  RADIOLOGY:  Dg Chest Port 1 View  11/25/2014   CLINICAL DATA:  Congestive heart failure.  EXAM: PORTABLE CHEST - 1 VIEW  COMPARISON:  November 23, 2014.  FINDINGS: Stable cardiomegaly. Status post coronary artery bypass graft. Left-sided pacemaker is unchanged in position. Right-sided PICC line is unchanged with distal tip overlying expected position of the SVC. No pneumothorax is noted. Increased right upper lobe opacity is noted concerning for developing pneumonia or possibly edema. Stable central pulmonary vascular congestion is noted. Central pulmonary vascular congestion is noted increased bibasilar opacities are noted concerning for subsegmental atelectasis or possibly edema.  IMPRESSION: Increased bibasilar opacities are noted concerning for subsegmental atelectasis or possibly edema. Increased  right upper lobe opacity is noted concerning for pneumonia or possibly edema. Continued radiographic follow-up is recommended.   Electronically  Signed   By: Marijo Conception, M.D.   On: 11/25/2014 13:48    ASSESSMENT AND PLAN:   * Acute on chronic diastolic chf - IV Lasix, Beta blockers - Input and Output - Counseled to limit fluids and Salt - Monitor Bun/Cr and Potassium  * Acute respiratory failure due to CHF  * Atrial fibrillation RVR Converted to NSR on amiodarone drip but had to be restarted due to RVR again. Conitnue drip and change to PO when able. On Digoxin, Cardizem and BB Eliquis held for cath. On heparin drip.  * NSTEMI   Troponin rose to 8   Heparin drip. Cath per Dr. Nehemiah Massed.  * Strep viridans bacteremia On IV abx. Appreciate ID recommendations. Echo showed no endocarditis. Repeat Bcx pending.  * Depression and anxiety: Patient continue on outpatient medications.  All the records are reviewed and case discussed with Care Management/Social Workerr. Management plans discussed with the patient, family and they are in agreement.  CODE STATUS: limited ( DNI)  TOTAL CRITICAL CARE TIME TAKING CARE OF THIS PATIENT: 35 minutes.   Ruth Hodges R M.D on 11/26/2014   Between 7am to 6pm - Pager - [phone]  After 6pm go to [email or web address] - password [email or web address] [facility]  Office  [phone]  CC: Primary care physician; Wilhemena Durie, MD

## 2014-11-26 NOTE — Progress Notes (Signed)
Pt remains oriented and lethargic during shift; vss; per Dr. Shawna [place] may check vs q2hrs; adequate uop via foley; pt is afibb and paced on cardiac monitor; O2 sats currently upper 90's on 3L O2 via nasal canula; no c/o pain during shift; pt tolerating current diet; plans for cardiac cath in the am; will continue to monitor and assess pt

## 2014-11-26 NOTE — Progress Notes (Signed)
[facility] INFECTIOUS DISEASE PROGRESS NOTE Date of Admission:  11/23/2014     ID: Shatha Hooser is a 74 y.o. female with  bacteremia Active Problems:   Atrial fibrillation   Subjective: BCx with viridans strep Weaning off O2, HR better.   ROS  Eleven systems are reviewed and negative except per hpi  Medications:  Antibiotics Given (last 72 hours)    Date/Time Action Medication Dose Rate   11/25/14 1633 Given   vancomycin (VANCOCIN) IVPB 750 mg/150 ml premix 750 mg 150 mL/hr   11/25/14 2105 Given   vancomycin (VANCOCIN) IVPB 750 mg/150 ml premix 750 mg 150 mL/hr     . amitriptyline  100 mg Oral QHS  . digoxin  0.125 mg Oral Daily  . furosemide  40 mg Intravenous BID  . isosorbide mononitrate  30 mg Oral Daily  . levothyroxine  150 mcg Oral QAC breakfast  . potassium chloride  40 mEq Oral 6 times per day  . predniSONE  50 mg Oral Q breakfast  . rosuvastatin  10 mg Oral Daily  . sodium chloride  10-40 mL Intracatheter Q12H  . traMADol  50 mg Oral TID  . vancomycin  750 mg Intravenous Q12H  . vitamin C  250 mg Oral Daily    Objective: Vital signs in last 24 hours: Temp:  [98.4 F (36.9 C)-99.2 F (37.3 C)] 98.4 F (36.9 C) (08/11 0400) Pulse Rate:  [48-168] 126 (08/11 0500) Resp:  [15-33] 25 (08/11 0500) BP: (100-154)/(53-111) 122/75 mmHg (08/11 0500) SpO2:  [89 %-100 %] 90 % (08/11 0500) FiO2 (%):  [40 %-50 %] 50 % (08/10 1600) Constitutional: oriented to person, place, and time. Dishelved, ill appearing HENT: [place]/AT, PERRLA, no scleral icterus Mouth/Throat: Oropharynx is clear and very dry, poor dentition. No oropharyngeal exudate.  Cardiovascular: Normal rate,3.6 sm Pulmonary/Chest: Effort normal and breath sounds normal. No respiratory distress. has no wheezes.  Neck supple, no nuchal rigidity Access i PICC RUE Abdominal: Soft. Bowel sounds are normal. exhibits no distension. There is no tenderness.  Lymphadenopathy: no cervical adenopathy. No  axillary adenopathy Neurological: alert and oriented to person, place, and time.  Skin: Skin is warm and dry. No rash noted. No erythema. Possible splinter hemorrhage on R hand fingernail Psychiatric: a normal mood and affect. behavior is normal.   Lab Results  Recent Labs  11/25/14 0450 11/26/14 0506  WBC 19.3* 13.9*  HGB 8.4* 9.2*  HCT 26.3* 28.6*  NA 139 139  K 3.8 3.2*  CL 108 104  CO2 25 26  BUN 25* 21*  CREATININE 0.94 0.73    Microbiology: Results for orders placed or performed during the hospital encounter of 11/23/14  MRSA PCR Screening     Status: None   Collection Time: 11/23/14  3:18 PM  Result Value Ref Range Status   MRSA by PCR NEGATIVE NEGATIVE Final    Comment:        The GeneXpert MRSA Assay (FDA approved for NASAL specimens only), is one component of a comprehensive MRSA colonization surveillance program. It is not intended to diagnose MRSA infection nor to guide or monitor treatment for MRSA infections.   Culture, blood (routine x 2)     Status: None (Preliminary result)   Collection Time: 11/24/14  7:07 AM  Result Value Ref Range Status   Specimen Description BLOOD BOTH 5ML  Final   Special Requests Immunocompromised RIGHT HAND  Final   Culture NO GROWTH 1 DAY  Final   Report Status PENDING  Incomplete  Culture, blood (routine x 2)     Status: None (Preliminary result)   Collection Time: 11/24/14  7:12 AM  Result Value Ref Range Status   Specimen Description BLOOD BOTH 5ML  Final   Special Requests Immunocompromised LEFT HAND  Final   Culture  Setup Time   Final    GRAM POSITIVE COCCI ANAEROBIC BOTTLE ONLY CRITICAL RESULT CALLED TO, READ BACK BY AND VERIFIED WITH: IRIS GUIDRY @ 3403 8.9.16 MPG    Culture   Final    VIRIDANS STREPTOCOCCUS POSSIBLE CONTAMINATION WITH SKIN FLORA    Report Status PENDING  Incomplete     Studies/Results: Dg Chest Port 1 View  11/25/2014   CLINICAL DATA:  Congestive heart failure.  EXAM: PORTABLE  CHEST - 1 VIEW  COMPARISON:  November 23, 2014.  FINDINGS: Stable cardiomegaly. Status post coronary artery bypass graft. Left-sided pacemaker is unchanged in position. Right-sided PICC line is unchanged with distal tip overlying expected position of the SVC. No pneumothorax is noted. Increased right upper lobe opacity is noted concerning for developing pneumonia or possibly edema. Stable central pulmonary vascular congestion is noted. Central pulmonary vascular congestion is noted increased bibasilar opacities are noted concerning for subsegmental atelectasis or possibly edema.  IMPRESSION: Increased bibasilar opacities are noted concerning for subsegmental atelectasis or possibly edema. Increased right upper lobe opacity is noted concerning for pneumonia or possibly edema. Continued radiographic follow-up is recommended.   Electronically Signed   By: Marijo Conception, M.D.   On: 11/25/2014 13:48    Assessment/Plan: Arpi Diebold is a 74 y.o. female with history of sick sinus syndrome status post pacemaker and atrial fibrillation on anticoagulation who presents 8/8 with palpitations and chest pain. Found to be in RVR with HR in 150s. She also had a leukocytosis. She was treated with amio and stabilized but then had repeat decompensation.  Since admission 1/2 bcx + GPC. She has had a fever to 102. WBC was elevated but decreasing some. She had a recent skin outbreak from sunscreen where she says her skin was all inflamed and she was treated with steroids. She does not report any purulence or boils with this outbreak and it has responded nicely to prednisone. She has not had any recent procedures or dental work but does have some ongoing poor dentition.  CT chest neg for PNA. UA negative  I am concerned for endocarditis given the valvular heart disease, PPM, Fever, viridans strep bacterami and possible splinter hemorrhage in setting of recent skin eruption.  Recommendations Repeat bcx x 2 pending Cont  vancomycin Would suggest TEE if stable enough  Thank you very much for the consult. Will follow with you.  [place], [place]   11/26/2014, 7:56 AM

## 2014-11-27 ENCOUNTER — Encounter: Payer: Self-pay | Admitting: Cardiology

## 2014-11-27 ENCOUNTER — Encounter
Admission: EM | Disposition: A | Discharge status: Home Health | Payer: Self-pay | Source: Home / Self Care | Attending: Internal Medicine

## 2014-11-27 HISTORY — PX: CARDIAC CATHETERIZATION: SHX172

## 2014-11-27 LAB — GLUCOSE, CAPILLARY: GLUCOSE-CAPILLARY: 101 mg/dL — AB (ref 65–99)

## 2014-11-27 LAB — CBC
HCT: 30.5 % — ABNORMAL LOW (ref 35.0–47.0)
HEMOGLOBIN: 9.6 g/dL — AB (ref 12.0–16.0)
MCH: 26 pg (ref 26.0–34.0)
MCHC: 31.5 g/dL — AB (ref 32.0–36.0)
MCV: 82.5 fL (ref 80.0–100.0)
PLATELETS: 202 10*3/uL (ref 150–440)
RBC: 3.69 MIL/uL — AB (ref 3.80–5.20)
RDW: 16.3 % — ABNORMAL HIGH (ref 11.5–14.5)
WBC: 11.6 10*3/uL — ABNORMAL HIGH (ref 3.6–11.0)

## 2014-11-27 LAB — BASIC METABOLIC PANEL
ANION GAP: 6 (ref 5–15)
BUN: 18 mg/dL (ref 6–20)
CHLORIDE: 107 mmol/L (ref 101–111)
CO2: 27 mmol/L (ref 22–32)
CREATININE: 0.74 mg/dL (ref 0.44–1.00)
Calcium: 8.4 mg/dL — ABNORMAL LOW (ref 8.9–10.3)
GFR calc Af Amer: >60 mL/min (ref >60)
GFR calc non Af Amer: >60 mL/min (ref >60)
Glucose, Bld: 113 mg/dL — ABNORMAL HIGH (ref 65–99)
POTASSIUM: 4.2 mmol/L (ref 3.5–5.1)
Sodium: 140 mmol/L (ref 135–145)

## 2014-11-27 LAB — BLOOD GAS, ARTERIAL
ACID-BASE DEFICIT: 2.3 mmol/L — AB (ref 0.0–2.0)
Allens test (pass/fail): POSITIVE — AB
BICARBONATE: 21.4 meq/L (ref 21.0–28.0)
FIO2: 0.99
O2 Saturation: 99.4 %
PCO2 ART: 33 mmHg (ref 32.0–48.0)
PO2 ART: 154 mmHg — AB (ref 83.0–108.0)
Patient temperature: 37
pH, Arterial: 7.42 (ref 7.350–7.450)

## 2014-11-27 LAB — MAGNESIUM: MAGNESIUM: 1.9 mg/dL (ref 1.7–2.4)

## 2014-11-27 LAB — HEPARIN LEVEL (UNFRACTIONATED): Heparin Unfractionated: 0.8 IU/mL — ABNORMAL HIGH (ref 0.30–0.70)

## 2014-11-27 LAB — VANCOMYCIN, TROUGH: Vancomycin Tr: 13 ug/mL (ref 10–20)

## 2014-11-27 SURGERY — LEFT HEART CATH AND CORS/GRAFTS ANGIOGRAPHY

## 2014-11-27 MED ORDER — HEPARIN (PORCINE) IN NACL 2-0.9 UNIT/ML-% IJ SOLN
INTRAMUSCULAR | Status: AC
  Filled 2014-11-27: qty 1000

## 2014-11-27 MED ORDER — DIGOXIN 250 MCG PO TABS
0.2500 mg | ORAL_TABLET | Freq: Every day | ORAL | Status: DC
  Administered 2014-11-28 – 2014-12-01 (×4): 0.25 mg via ORAL
  Filled 2014-11-27 (×4): qty 1

## 2014-11-27 MED ORDER — MIDAZOLAM HCL 2 MG/2ML IJ SOLN
INTRAMUSCULAR | Status: AC
  Filled 2014-11-27: qty 2

## 2014-11-27 MED ORDER — ALUM & MAG HYDROXIDE-SIMETH 200-200-20 MG/5ML PO SUSP: 30.0000 mL | Freq: Four times a day (QID) | ORAL | Status: AC | PRN

## 2014-11-27 MED ORDER — AMIODARONE HCL 200 MG PO TABS
400.0000 mg | ORAL_TABLET | Freq: Every day | ORAL | Status: DC
  Administered 2014-11-27 – 2014-11-28 (×2): 400 mg via ORAL
  Filled 2014-11-27 (×2): qty 2

## 2014-11-27 MED ORDER — IOHEXOL 300 MG/ML  SOLN
INTRAMUSCULAR | Status: DC | PRN
  Administered 2014-11-27: 75 mL via INTRA_ARTERIAL
  Administered 2014-11-27: 100 mL via INTRA_ARTERIAL

## 2014-11-27 MED ORDER — FENTANYL CITRATE (PF) 100 MCG/2ML IJ SOLN
INTRAMUSCULAR | Status: AC
  Filled 2014-11-27: qty 2

## 2014-11-27 MED ORDER — FENTANYL CITRATE (PF) 100 MCG/2ML IJ SOLN
INTRAMUSCULAR | Status: DC | PRN
  Administered 2014-11-27 (×2): 25 ug via INTRAVENOUS

## 2014-11-27 MED ORDER — MIDAZOLAM HCL 2 MG/2ML IJ SOLN
INTRAMUSCULAR | Status: DC | PRN
  Administered 2014-11-27 (×2): 0.5 mg via INTRAVENOUS

## 2014-11-27 MED ORDER — APIXABAN 5 MG PO TABS
5.0000 mg | ORAL_TABLET | Freq: Two times a day (BID) | ORAL | Status: DC
  Administered 2014-11-27 – 2014-12-01 (×9): 5 mg via ORAL
  Filled 2014-11-27 (×9): qty 1

## 2014-11-27 MED ORDER — DIGOXIN 0.25 MG/ML IJ SOLN
0.2500 mg | Freq: Once | INTRAMUSCULAR | Status: AC
  Administered 2014-11-27: 0.25 mg via INTRAVENOUS
  Filled 2014-11-27: qty 2

## 2014-11-27 SURGICAL SUPPLY — 12 items
CATH INFINITI 5 FR IM (CATHETERS) ×3 IMPLANT
CATH INFINITI 5FR ANG PIGTAIL (CATHETERS) ×3 IMPLANT
CATH INFINITI 5FR JL4 (CATHETERS) ×3 IMPLANT
CATH INFINITI JR4 5F (CATHETERS) ×3 IMPLANT
DEVICE CLOSURE MYNXGRIP 6/7F (Vascular Products) ×3 IMPLANT
KIT MANI 3VAL PERCEP (MISCELLANEOUS) ×3 IMPLANT
NEEDLE PERC 18GX7CM (NEEDLE) ×3 IMPLANT
PACK CARDIAC CATH (CUSTOM PROCEDURE TRAY) ×3 IMPLANT
SHEATH AVANTI 5FR X 11CM (SHEATH) ×3 IMPLANT
SHEATH AVANTI 6FR X 11CM (SHEATH) ×3 IMPLANT
WIRE EMERALD 3MM-J .035X150CM (WIRE) ×3 IMPLANT
WIRE EMERALD 3MM-J .035X260CM (WIRE) ×3 IMPLANT

## 2014-11-27 NOTE — Progress Notes (Signed)
ANTICOAGULATION CONSULT NOTE - Follow up [place] for Heparin Indication: chest pain/ACS  Allergies  Allergen Reactions  . Atorvastatin Other (See Comments)  . Codeine   . Ceftin  [Cefuroxime Axetil] Other (See Comments)    Patient Measurements: Height: 5\' 5"  (165.1 cm) Weight: 173 lb 12.8 oz (78.835 kg) (upon transfer) IBW/kg (Calculated) : 57 Heparin Dosing Weight: 73 kg   Vital Signs: Temp: 98.3 F (36.8 C) (08/12 0400) Temp Source: Oral (08/12 0400) BP: 125/49 mmHg (08/12 0600) Pulse Rate: 60 (08/12 0600)  Labs:  Recent Labs  11/25/14 0450  11/26/14 0506 11/26/14 1158 11/26/14 1935 11/27/14 0458  HGB 8.4*  --  9.2*  --   --  9.6*  HCT 26.3*  --  28.6*  --   --  30.5*  PLT 163  --  184  --   --  202  APTT 60*  < > 57* 60* 62*  --   HEPARINUNFRC 1.34*  --  0.56 0.54  --  0.80*  CREATININE 0.94  --  0.73  --   --  0.74  < > = values in this interval not displayed.  Estimated Creatinine Clearance: 62.1 mL/min (by C-G formula based on Cr of 0.74).   Medical History: Past Medical History  Diagnosis Date  . Anxiety   . Depression   . A-fib   . Arthritis   . Hypertension   . Hyperlipidemia   . Thyroid disease   . CAD (coronary artery disease)     Medications:  Scheduled:  . amitriptyline  100 mg Oral QHS  . digoxin  0.125 mg Oral Daily  . furosemide  40 mg Intravenous BID  . isosorbide mononitrate  30 mg Oral Daily  . levothyroxine  150 mcg Oral QAC breakfast  . rosuvastatin  10 mg Oral Daily  . sodium chloride  10-40 mL Intracatheter Q12H  . traMADol  50 mg Oral TID  . vancomycin  750 mg Intravenous Q12H  . vitamin C  250 mg Oral Daily    Assessment: Patient being treated for NSTEMI. Previously on apixaban. Last dose of apixaban was on 8/8 at 2200.  APTT at 1200 of 60 Goal APTT: 68-109   Patient currently ordered   Goal of Therapy:  Heparin level 0.3-0.7 units/ml Monitor platelets by anticoagulation protocol: Yes   Plan:   Will increased heparin drip rate to 1350 units/hr as APTT is below goal range of 68-109.  Will recheck APTT in 6 hours at 1900 to continue to assess dosing.    Will recheck HL, APTT, and CBC with AM labs.  If APTT and HL correlate at this time, may dose using HL only.   Pharmacy will continue to follow.   8/12 AM anti-Xa 0.8. Reduce to 1250 units/hr and recheck in 8 hours.    Sim Boast, PharmD, BCPS  11/27/2014

## 2014-11-27 NOTE — Progress Notes (Signed)
Report called to [facility]  Check right groin for bleeding or hematoma.  Patient will be on bedrest for 2 hours post sheath pull---out of bed at 11:40.  Bilateral pulses are 2's PT's.

## 2014-11-27 NOTE — Care Management Important Message (Signed)
Important Message  Patient Details  Name: Ruth Hodges MRN: [id number] Date of Birth: 1941-02-19   Medicare Important Message Given:  Yes-third notification given    Juliann Pulse A Allmond 11/27/2014, 1:18 PM

## 2014-11-27 NOTE — Progress Notes (Signed)
[facility] NAME: Ruth Hodges    MR#:  [id number]  DATE OF BIRTH:  1940/10/21  SUBJECTIVE:  CHIEF COMPLAINT:   Chief Complaint  Patient presents with  . Chest Pain   Admitted for Afib, NSTEMI. Acute decompensation with Afib RVR and CHF 11/25/2014. S/p Cath today. No PCI. On amiodarone drip and now in NSR at 60. paced  REVIEW OF SYSTEMS:   CONSTITUTIONAL: No fever. Positive  Fatigue. EYES: No blurred or double vision.  EARS, NOSE, AND THROAT: No tinnitus or ear pain.  RESPIRATORY: No cough, positive occasional shortness of breath, no wheezing or hemoptysis.  CARDIOVASCULAR: No chest pain, no orthopnea or edema.  GASTROINTESTINAL: No nausea, vomiting, diarrhea or abdominal pain.  GENITOURINARY: No dysuria, hematuria.  ENDOCRINE: No polyuria, nocturia,  HEMATOLOGY: No anemia, positive easy bruising no bleeding SKIN: No rash or lesion. MUSCULOSKELETAL: No joint pain or arthritis.  NEUROLOGIC: No tingling, numbness, weakness.  PSYCHIATRY: Positive anxiety and depression.   ROS  DRUG ALLERGIES:   Allergies  Allergen Reactions  . Atorvastatin Other (See Comments)  . Codeine   . Ceftin  [Cefuroxime Axetil] Other (See Comments)    VITALS:  Blood pressure 138/58, pulse 59, temperature 97.8 F (36.6 C), temperature source Oral, resp. rate 15, height 5\' 5"  (1.651 m), weight 78.835 kg (173 lb 12.8 oz), SpO2 97 %.  PHYSICAL EXAMINATION:  GENERAL: 74 y.o.-year-old patient lying in the bed. EYES: Pupils equal, round, reactive to light and accommodation. No scleral icterus. Extraocular muscles intact.  HEENT: Head atraumatic, normocephalic. Oropharynx and nasopharynx clear.  NECK: Supple, no jugular venous distention. No thyroid enlargement, no tenderness.  LUNGS: Normal breath sounds bilaterally, no wheezing, rales,rhonchi or crepitation. No use of accessory muscles of respiration.  CARDIOVASCULAR: S1 S2,  2/6 SEM no rubs, or gallops. regular. ABDOMEN: Soft, nontender, nondistended. Bowel sounds present. No organomegaly or mass.  EXTREMITIES: No pedal edema, cyanosis, or clubbing.  NEUROLOGIC: Cranial nerves II through XII are grossly intact. No focal deficits. PSYCHIATRIC: The patient is sleepy as did not had good sleep. But easily arousable and oriented x 3. Anxious SKIN: No obvious rash, lesion, or ulcer.   Physical Exam LABORATORY PANEL:   CBC  Recent Labs Lab 11/27/14 0458  WBC 11.6*  HGB 9.6*  HCT 30.5*  PLT 202   ------------------------------------------------------------------------------------------------------------------  Chemistries   Recent Labs Lab 11/27/14 0458  NA 140  K 4.2  CL 107  CO2 27  GLUCOSE 113*  BUN 18  CREATININE 0.74  CALCIUM 8.4*  MG 1.9   ------------------------------------------------------------------------------------------------------------------  Cardiac Enzymes  Recent Labs Lab 11/23/14 2155 11/24/14 0336  TROPONINI 6.68* 8.43*   ------------------------------------------------------------------------------------------------------------------  RADIOLOGY:  Dg Chest Port 1 View  11/25/2014   CLINICAL DATA:  Congestive heart failure.  EXAM: PORTABLE CHEST - 1 VIEW  COMPARISON:  November 23, 2014.  FINDINGS: Stable cardiomegaly. Status post coronary artery bypass graft. Left-sided pacemaker is unchanged in position. Right-sided PICC line is unchanged with distal tip overlying expected position of the SVC. No pneumothorax is noted. Increased right upper lobe opacity is noted concerning for developing pneumonia or possibly edema. Stable central pulmonary vascular congestion is noted. Central pulmonary vascular congestion is noted increased bibasilar opacities are noted concerning for subsegmental atelectasis or possibly edema.  IMPRESSION: Increased bibasilar opacities are noted concerning for subsegmental atelectasis or possibly edema.  Increased right upper lobe opacity is noted concerning for pneumonia or possibly edema. Continued radiographic follow-up is  recommended.   Electronically Signed   By: Marijo Conception, M.D.   On: 11/25/2014 13:48    ASSESSMENT AND PLAN:   * Acute on chronic diastolic chf - IV Lasix, Beta blockers - Input and Output - Counseled to limit fluids and Salt. - Monitor Bun/Cr and Potassium  * Acute respiratory failure due to CHF Resolved  * Atrial fibrillation RVR Converted to NSR on amiodarone drip . Will change to PO amiodarone. Discussed with Dr. Levin Erp Eliquis.  * Demand ischemia Thought to be NSTEMI but s/p Cath she has CAD but o lesions amenable to PCI   Troponin rose to 8   Heparin drip stopped.  * Strep viridans bacteremia On IV abx. Appreciate ID recommendations. Echo showed no endocarditis. Repeat Bcx pending. Discussed with Dr. Ubaldo Glassing regarding TEE and will schedule.  * Depression and anxiety: Patient continue on outpatient medications.  All the records are reviewed and case discussed with Care Management/Social Workerr. Management plans discussed with the patient, family and they are in agreement.  CODE STATUS: limited ( DNI)  TOTAL TIME TAKING CARE OF THIS PATIENT: 35 minutes.   Hillary Bow R M.D on 11/27/2014   Between 7am to 6pm - Pager - [phone]  After 6pm go to [email or web address] - password [email or web address] [facility]  Office  [phone]  CC: Primary care physician; Wilhemena Durie, MD

## 2014-11-27 NOTE — Progress Notes (Signed)
[facility] INFECTIOUS DISEASE PROGRESS NOTE Date of Admission:  11/23/2014     ID: Ruth Hodges is a 74 y.o. female with  bacteremia Active Problems:   Atrial fibrillation   Subjective: Afebrile, off O2, HR stable Had cath today  ROS  Eleven systems are reviewed and negative except per hpi  Medications:  Antibiotics Given (last 72 hours)    Date/Time Action Medication Dose Rate   11/25/14 1633 Given   vancomycin (VANCOCIN) IVPB 750 mg/150 ml premix 750 mg 150 mL/hr   11/25/14 2105 Given   vancomycin (VANCOCIN) IVPB 750 mg/150 ml premix 750 mg 150 mL/hr   11/26/14 0930 Given   vancomycin (VANCOCIN) IVPB 750 mg/150 ml premix 750 mg 150 mL/hr   11/26/14 2052 Given   vancomycin (VANCOCIN) IVPB 750 mg/150 ml premix 750 mg 150 mL/hr   11/27/14 1000 Given   vancomycin (VANCOCIN) IVPB 750 mg/150 ml premix 750 mg 150 mL/hr   11/27/14 2202 Given   vancomycin (VANCOCIN) IVPB 750 mg/150 ml premix 750 mg 150 mL/hr     . amiodarone  400 mg Oral Daily  . amitriptyline  100 mg Oral QHS  . apixaban  5 mg Oral BID  . [START ON 11/28/2014] digoxin  0.25 mg Oral Daily  . furosemide  40 mg Intravenous BID  . isosorbide mononitrate  30 mg Oral Daily  . levothyroxine  150 mcg Oral QAC breakfast  . rosuvastatin  10 mg Oral Daily  . sodium chloride  10-40 mL Intracatheter Q12H  . traMADol  50 mg Oral TID  . vancomycin  750 mg Intravenous Q12H  . vitamin C  250 mg Oral Daily    Objective: Vital signs in last 24 hours: Temp:  [97.8 F (36.6 C)-98.5 F (36.9 C)] 98.5 F (36.9 C) (08/12 1900) Pulse Rate:  [43-109] 43 (08/12 2000) Resp:  [14-25] 19 (08/12 2000) BP: (117-152)/(49-127) 117/68 mmHg (08/12 2000) SpO2:  [93 %-100 %] 93 % (08/12 2000) Constitutional: oriented to person, place, and time. Dishelved, ill appearing HENT: [place]/AT, PERRLA, no scleral icterus Mouth/Throat: Oropharynx is clear and very dry, poor dentition. No oropharyngeal exudate.  Cardiovascular: Normal rate,3.6  sm Pulmonary/Chest: Effort normal and breath sounds normal. No respiratory distress. has no wheezes.  Neck supple, no nuchal rigidity Access i PICC RUE Abdominal: Soft. Bowel sounds are normal. exhibits no distension. There is no tenderness.  Lymphadenopathy: no cervical adenopathy. No axillary adenopathy Neurological: alert and oriented to person, place, and time.  Skin: Skin is warm and dry. No rash noted. No erythema. Possible splinter hemorrhage on R hand fingernail Psychiatric: a normal mood and affect. behavior is normal.   Lab Results  Recent Labs  11/26/14 0506 11/27/14 0458  WBC 13.9* 11.6*  HGB 9.2* 9.6*  HCT 28.6* 30.5*  NA 139 140  K 3.2* 4.2  CL 104 107  CO2 26 27  BUN 21* 18  CREATININE 0.73 0.74    Microbiology: Results for orders placed or performed during the hospital encounter of 11/23/14  MRSA PCR Screening     Status: None   Collection Time: 11/23/14  3:18 PM  Result Value Ref Range Status   MRSA by PCR NEGATIVE NEGATIVE Final    Comment:        The GeneXpert MRSA Assay (FDA approved for NASAL specimens only), is one component of a comprehensive MRSA colonization surveillance program. It is not intended to diagnose MRSA infection nor to guide or monitor treatment for MRSA infections.   Culture,  blood (routine x 2)     Status: None (Preliminary result)   Collection Time: 11/24/14  7:07 AM  Result Value Ref Range Status   Specimen Description BLOOD BOTH 5ML  Final   Special Requests Immunocompromised RIGHT HAND  Final   Culture NO GROWTH 3 DAYS  Final   Report Status PENDING  Incomplete  Culture, blood (routine x 2)     Status: None (Preliminary result)   Collection Time: 11/24/14  7:12 AM  Result Value Ref Range Status   Specimen Description BLOOD BOTH 5ML  Final   Special Requests Immunocompromised LEFT HAND  Final   Culture  Setup Time   Final    GRAM POSITIVE COCCI ANAEROBIC BOTTLE ONLY CRITICAL RESULT CALLED TO, READ BACK BY AND  VERIFIED WITH: IRIS GUIDRY @ 2305 8.9.16 MPG    Culture   Final    VIRIDANS STREPTOCOCCUS IDENTIFICATION AND SUSCEPTIBILITIES TO FOLLOW PER DR. Kanan Sobek    Report Status PENDING  Incomplete  Culture, blood (routine x 2)     Status: None (Preliminary result)   Collection Time: 11/26/14  8:11 AM  Result Value Ref Range Status   Specimen Description BLOOD LEFT WRIST  Final   Special Requests BOTTLES DRAWN AEROBIC AND ANAEROBIC  7 CC  Final   Culture NO GROWTH 1 DAY  Final   Report Status PENDING  Incomplete  Culture, blood (routine x 2)     Status: None (Preliminary result)   Collection Time: 11/26/14  8:25 AM  Result Value Ref Range Status   Specimen Description BLOOD RIGHT HAND  Final   Special Requests BOTTLES DRAWN AEROBIC AND ANAEROBIC  5 CC  Final   Culture NO GROWTH < 24 HOURS  Final   Report Status PENDING  Incomplete     Studies/Results: No results found.  Assessment/Plan: Ruth Hodges is a 74 y.o. female with history of sick sinus syndrome status post pacemaker and atrial fibrillation on anticoagulation who presents 8/8 with palpitations and chest pain. Found to be in RVR with HR in 150s. She also had a leukocytosis. She was treated with amio and stabilized but then had repeat decompensation.  Since admission 1/2 bcx + GPC. She has had a fever to 102. WBC was elevated but decreasing some. She had a recent skin outbreak from sunscreen where she says her skin was all inflamed and she was treated with steroids. She does not report any purulence or boils with this outbreak and it has responded nicely to prednisone. She has not had any recent procedures or dental work but does have some ongoing poor dentition.  CT chest neg for PNA. UA negative  I am concerned for endocarditis given the valvular heart disease, PPM, Fever, viridans strep bacterami and possible splinter hemorrhage in setting of recent skin eruption.  Recommendations Repeat bcx x 2 pending Cont vancomycin  - await sensitivites but can likely change to ceftriaxone however is listed as having allergy- will have to clarify with pt Would suggest TEE monday  Thank you very much for the consult. Will follow with you.  [place], [place]   11/27/2014, 10:46 PM

## 2014-11-28 LAB — BASIC METABOLIC PANEL
Anion gap: 13 (ref 5–15)
BUN: 23 mg/dL — ABNORMAL HIGH (ref 6–20)
CO2: 26 mmol/L (ref 22–32)
Calcium: 9.2 mg/dL (ref 8.9–10.3)
Chloride: 98 mmol/L — ABNORMAL LOW (ref 101–111)
Creatinine, Ser: 0.91 mg/dL (ref 0.44–1.00)
GFR calc Af Amer: >60 mL/min (ref >60)
GFR calc non Af Amer: 60 mL/min — ABNORMAL LOW (ref >60)
Glucose, Bld: 145 mg/dL — ABNORMAL HIGH (ref 65–99)
Potassium: 3.8 mmol/L (ref 3.5–5.1)
Sodium: 137 mmol/L (ref 135–145)

## 2014-11-28 LAB — CULTURE, BLOOD (ROUTINE X 2)

## 2014-11-28 LAB — MAGNESIUM: MAGNESIUM: 1.9 mg/dL (ref 1.7–2.4)

## 2014-11-28 MED ORDER — SENNOSIDES-DOCUSATE SODIUM 8.6-50 MG PO TABS
1.0000 | ORAL_TABLET | Freq: Two times a day (BID) | ORAL | Status: DC
  Administered 2014-11-28 – 2014-12-01 (×6): 1 via ORAL
  Filled 2014-11-28 (×5): qty 1

## 2014-11-28 MED ORDER — AMIODARONE HCL 200 MG PO TABS
400.0000 mg | ORAL_TABLET | Freq: Two times a day (BID) | ORAL | Status: DC
  Administered 2014-11-28 – 2014-12-01 (×6): 400 mg via ORAL
  Filled 2014-11-28 (×7): qty 2

## 2014-11-28 MED ORDER — VANCOMYCIN HCL IN DEXTROSE 1-5 GM/200ML-% IV SOLN
1000.0000 mg | Freq: Two times a day (BID) | INTRAVENOUS | Status: DC
  Administered 2014-11-28 – 2014-11-29 (×3): 1000 mg via INTRAVENOUS
  Filled 2014-11-28 (×5): qty 200

## 2014-11-28 MED ORDER — MAGNESIUM SULFATE 2 GM/50ML IV SOLN
2.0000 g | Freq: Once | INTRAVENOUS | Status: AC
  Administered 2014-11-28: 2 g via INTRAVENOUS
  Filled 2014-11-28: qty 50

## 2014-11-28 MED ORDER — POLYETHYLENE GLYCOL 3350 17 G PO PACK
17.0000 g | PACK | Freq: Every day | ORAL | Status: AC
  Administered 2014-11-28 – 2014-11-29 (×2): 17 g via ORAL
  Filled 2014-11-28 (×2): qty 1

## 2014-11-28 MED ORDER — FUROSEMIDE 40 MG PO TABS
40.0000 mg | ORAL_TABLET | Freq: Two times a day (BID) | ORAL | Status: DC
  Administered 2014-11-28 – 2014-12-01 (×7): 40 mg via ORAL
  Filled 2014-11-28 (×7): qty 1

## 2014-11-28 NOTE — Progress Notes (Signed)
Paged Dr Juanna Cao. Patient is having frequent PVC's.

## 2014-11-28 NOTE — Progress Notes (Signed)
Pt complained of upset stomach, was given maalox/mylanta as ordered.  Pt reported some relief.  Pt also became agitated and was given ativan one time at 0008 as ordered.  Pt reported relief.  Pt did have several pvc's during the night.  Dr. Marcille Blanco was made aware and an order for a morning BMP was placed.

## 2014-11-28 NOTE — Progress Notes (Signed)
Dr Ubaldo Glassing aware of patients freq PVC's. Drew mag wnl. Dr. Ubaldo Glassing will assess patient once he comes up to unit to evaluate.

## 2014-11-28 NOTE — Progress Notes (Signed)
[place]  SUBJECTIVE: no chest pain. Recurrent afib. Some what confused. Telemetry shows afib with rvr and abearancy.    Filed Vitals:   11/28/14 0600 11/28/14 0700 11/28/14 0800 11/28/14 0954  BP: 99/61 96/71 121/55   Pulse: 97 86 71 112  Temp:   98 F (36.7 C)   TempSrc:      Resp: 19 14 8    Height:      Weight:      SpO2: 90% 96% 94%     Intake/Output Summary (Last 24 hours) at 11/28/14 1209 Last data filed at 11/28/14 0830  Gross per 24 hour  Intake    395 ml  Output   2300 ml  Net  -1905 ml    LABS: Basic Metabolic Panel:  Recent Labs  11/27/14 0458 11/28/14 0506  NA 140 137  K 4.2 3.8  CL 107 98*  CO2 27 26  GLUCOSE 113* 145*  BUN 18 23*  CREATININE 0.74 0.91  CALCIUM 8.4* 9.2  MG 1.9 1.9   Liver Function Tests: No results for input(s): AST, ALT, ALKPHOS, BILITOT, PROT, ALBUMIN in the last 72 hours. No results for input(s): LIPASE, AMYLASE in the last 72 hours. CBC:  Recent Labs  11/26/14 0506 11/27/14 0458  WBC 13.9* 11.6*  HGB 9.2* 9.6*  HCT 28.6* 30.5*  MCV 82.5 82.5  PLT 184 202   Cardiac Enzymes: No results for input(s): CKTOTAL, CKMB, CKMBINDEX, TROPONINI in the last 72 hours. BNP: Invalid input(s): POCBNP D-Dimer: No results for input(s): DDIMER in the last 72 hours. Hemoglobin A1C: No results for input(s): HGBA1C in the last 72 hours. Fasting Lipid Panel: No results for input(s): CHOL, HDL, LDLCALC, TRIG, CHOLHDL, LDLDIRECT in the last 72 hours. Thyroid Function Tests: No results for input(s): TSH, T4TOTAL, T3FREE, THYROIDAB in the last 72 hours.  Invalid input(s): FREET3 Anemia Panel: No results for input(s): VITAMINB12, FOLATE, FERRITIN, TIBC, IRON, RETICCTPCT in the last 72 hours.   Physical Exam: Blood pressure 121/55, pulse 112, temperature 98 F (36.7 C), temperature source Oral, resp. rate 8, height 5\' 5"  (1.651 m), weight 78.835 kg (173 lb 12.8 oz), SpO2 94 %.   General  appearance: distracted and mild distress Head: Normocephalic, without obvious abnormality, atraumatic Resp: clear to auscultation bilaterally Cardio: irregularly irregular rhythm GI: soft, non-tender; bowel sounds normal; no masses,  no organomegaly Extremities: extremities normal, atraumatic, no cyanosis or edema  TELEMETRY: Reviewed telemetry pt in afib with rvr and abearancy ASSESSMENT AND PLAN:  Active Problems:   Atrial fibrillation- recurrent afib with rvr. WIll increase amioidarone to 400 bid po. Agree with digoxin. Will give iv magnesiuim 2 g     Ruth Hodges., MD, [facility] 11/28/2014 12:09 PM

## 2014-11-28 NOTE — Progress Notes (Signed)
ANTIBIOTIC CONSULT NOTE - INITIAL  Pharmacy Consult for Vancomycin Indication: endocarditis  Allergies  Allergen Reactions  . Atorvastatin Other (See Comments)  . Codeine   . Ceftin  [Cefuroxime Axetil] Other (See Comments)    Patient Measurements: Height: 5\' 5"  (165.1 cm) Weight: 173 lb 12.8 oz (78.835 kg) (upon transfer) IBW/kg (Calculated) : 57   Vital Signs: Temp: 98 F (36.7 C) (08/13 0800) Temp Source: Oral (08/13 0100) BP: 121/55 mmHg (08/13 0800) Pulse Rate: 112 (08/13 0954) Intake/Output from previous day: 08/12 0701 - 08/13 0700 In: 300 [IV Piggyback:300] Out: 2300 [Urine:2300] Intake/Output from this shift:    Labs:  Recent Labs  11/26/14 0506 11/27/14 0458 11/28/14 0506  WBC 13.9* 11.6*  --   HGB 9.2* 9.6*  --   PLT 184 202  --   CREATININE 0.73 0.74 0.91   Estimated Creatinine Clearance: 54.5 mL/min (by C-G formula based on Cr of 0.91).  Recent Labs  11/27/14 1150  [place]     Microbiology: Recent Results (from the past 720 hour(s))  MRSA PCR Screening     Status: None   Collection Time: 11/23/14  3:18 PM  Result Value Ref Range Status   MRSA by PCR NEGATIVE NEGATIVE Final    Comment:        The GeneXpert MRSA Assay (FDA approved for NASAL specimens only), is one component of a comprehensive MRSA colonization surveillance program. It is not intended to diagnose MRSA infection nor to guide or monitor treatment for MRSA infections.   Culture, blood (routine x 2)     Status: None (Preliminary result)   Collection Time: 11/24/14  7:07 AM  Result Value Ref Range Status   Specimen Description BLOOD BOTH 5ML  Final   Special Requests Immunocompromised RIGHT HAND  Final   Culture NO GROWTH 3 DAYS  Final   Report Status PENDING  Incomplete  Culture, blood (routine x 2)     Status: None   Collection Time: 11/24/14  7:12 AM  Result Value Ref Range Status   Specimen Description BLOOD BOTH 5ML  Final   Special Requests  Immunocompromised LEFT HAND  Final   Culture  Setup Time   Final    GRAM POSITIVE COCCI ANAEROBIC BOTTLE ONLY CRITICAL RESULT CALLED TO, READ BACK BY AND VERIFIED WITH: IRIS GUIDRY @ 3086 8.9.16 MPG    Culture   Final    VIRIDANS STREPTOCOCCUS SENSITIVITIES PERFORMED PER DR. FITZGERALD    Report Status 11/28/2014 FINAL  Final   Organism ID, Bacteria VIRIDANS STREPTOCOCCUS  Final      Susceptibility   Viridans streptococcus - MIC*    ERYTHROMYCIN Value in next row Sensitive      SENSITIVE0.12    VANCOMYCIN Value in next row Sensitive      SENSITIVE0.5    LEVOFLOXACIN Value in next row Sensitive      SENSITIVE2    CEFTRIAXONE Value in next row Sensitive      SENSITIVE0.12    PENICILLIN Value in next row Sensitive      SENSITIVE0.12    * VIRIDANS STREPTOCOCCUS  Culture, blood (routine x 2)     Status: None (Preliminary result)   Collection Time: 11/26/14  8:11 AM  Result Value Ref Range Status   Specimen Description BLOOD LEFT WRIST  Final   Special Requests BOTTLES DRAWN AEROBIC AND ANAEROBIC  7 CC  Final   Culture NO GROWTH 1 DAY  Final   Report Status PENDING  Incomplete  Culture,  blood (routine x 2)     Status: None (Preliminary result)   Collection Time: 11/26/14  8:25 AM  Result Value Ref Range Status   Specimen Description BLOOD RIGHT HAND  Final   Special Requests BOTTLES DRAWN AEROBIC AND ANAEROBIC  5 CC  Final   Culture NO GROWTH < 24 HOURS  Final   Report Status PENDING  Incomplete    Medical History: Past Medical History  Diagnosis Date  . Anxiety   . Depression   . A-fib   . Arthritis   . Hypertension   . Hyperlipidemia   . Thyroid disease   . CAD (coronary artery disease)     Medications:  Scheduled:  . amiodarone  400 mg Oral Daily  . amitriptyline  100 mg Oral QHS  . apixaban  5 mg Oral BID  . digoxin  0.25 mg Oral Daily  . furosemide  40 mg Oral BID  . isosorbide mononitrate  30 mg Oral Daily  . levothyroxine  150 mcg Oral QAC breakfast  .  polyethylene glycol  17 g Oral Daily  . rosuvastatin  10 mg Oral Daily  . senna-docusate  1 tablet Oral BID  . sodium chloride  10-40 mL Intracatheter Q12H  . traMADol  50 mg Oral TID  . vancomycin  1,000 mg Intravenous Q12H  . vitamin C  250 mg Oral Daily   Infusions:    Assessment: Pharmacy consulted to dose vancomycin in 74 yo female with fever, leukocytosis and bacteremia initiated on vancomycin for possible bacteremia.    Goal of Therapy:  Vancomycin trough level 15-20 mcg/ml  Plan:  Vancomycin level resulted @ 13 mcg/ml on Vancomycin 750 mg IV q12 hours. Will change to vancomycin 1 g iv q12 hours. Will order vancomycin trough level prior to the 22:00 dose on 8/14.    Pharmacy will continue to monitor and adjust per consult.    Markale Birdsell D 11/28/2014,10:01 AM

## 2014-11-28 NOTE — Progress Notes (Signed)
[place] at [place] NAME: Ruth Hodges    MR#:  [id number]  DATE OF BIRTH:  Mar 20, 1941  SUBJECTIVE:  CHIEF COMPLAINT:   Chief Complaint  Patient presents with  . Chest Pain   Admitted for Afib, NSTEMI. Acute decompensation with Afib RVR and CHF 11/25/2014. S/p Cath today. No PCI. Converted to NSR on amiodarone drip and changed to PO 8/12, Today again has Afib with HR >100. Constipated. SOb better. No CP  REVIEW OF SYSTEMS:   CONSTITUTIONAL: No fever. Positive  Fatigue. EYES: No blurred or double vision.  EARS, NOSE, AND THROAT: No tinnitus or ear pain.  RESPIRATORY: No cough, positive occasional shortness of breath, no wheezing or hemoptysis.  CARDIOVASCULAR: No chest pain, no orthopnea or edema.  GASTROINTESTINAL: No nausea, vomiting, diarrhea or abdominal pain.  GENITOURINARY: No dysuria, hematuria.  ENDOCRINE: No polyuria, nocturia,  HEMATOLOGY: No anemia, positive easy bruising no bleeding SKIN: No rash or lesion. MUSCULOSKELETAL: No joint pain or arthritis.  NEUROLOGIC: No tingling, numbness, weakness.  PSYCHIATRY: Positive anxiety and depression.   ROS  DRUG ALLERGIES:   Allergies  Allergen Reactions  . Atorvastatin Other (See Comments)  . Codeine   . Ceftin  [Cefuroxime Axetil] Other (See Comments)    VITALS:  Blood pressure 99/61, pulse 97, temperature 98.3 F (36.8 C), temperature source Oral, resp. rate 19, height 5\' 5"  (1.651 m), weight 78.835 kg (173 lb 12.8 oz), SpO2 90 %.  PHYSICAL EXAMINATION:  GENERAL: 74 y.o.-year-old patient lying in the bed. EYES: Pupils equal, round, reactive to light and accommodation. No scleral icterus. Extraocular muscles intact.  HEENT: Head atraumatic, normocephalic. Oropharynx and nasopharynx clear.  NECK: Supple, no jugular venous distention. No thyroid enlargement, no tenderness.  LUNGS: Normal breath sounds bilaterally, no wheezing, rales,rhonchi or  crepitation. No use of accessory muscles of respiration.  CARDIOVASCULAR: S1 S2, 2/6 SEM no rubs, or gallops. regular. ABDOMEN: Soft, nontender, nondistended. Bowel sounds present. No organomegaly or mass.  EXTREMITIES: No pedal edema, cyanosis, or clubbing.  NEUROLOGIC: Cranial nerves II through XII are grossly intact. No focal deficits. PSYCHIATRIC: Alert and oriented x 3. Anxious SKIN: No obvious rash, lesion, or ulcer.   Physical Exam LABORATORY PANEL:   CBC  Recent Labs Lab 11/27/14 0458  WBC 11.6*  HGB 9.6*  HCT 30.5*  PLT 202   ------------------------------------------------------------------------------------------------------------------  Chemistries   Recent Labs Lab 11/27/14 0458 11/28/14 0506  NA 140 137  K 4.2 3.8  CL 107 98*  CO2 27 26  GLUCOSE 113* 145*  BUN 18 23*  CREATININE 0.74 0.91  CALCIUM 8.4* 9.2  MG 1.9  --    ------------------------------------------------------------------------------------------------------------------  Cardiac Enzymes  Recent Labs Lab 11/23/14 2155 11/24/14 0336  TROPONINI 6.68* 8.43*   ------------------------------------------------------------------------------------------------------------------  RADIOLOGY:  No results found.  ASSESSMENT AND PLAN:   * Acute on chronic diastolic chf - IV Lasix, Beta blockers - Change to PO lasix - Input and Output - Counseled to limit fluids and Salt. - Monitor Bun/Cr and Potassium  * Acute respiratory failure due to CHF Resolved  * Atrial fibrillation RVR Converted to NSR on amiodarone drip. But now back in Afib Changed to PO amiodarone. Discussed with Dr. Ubaldo Glassing Restarted Eliquis. Increase digoxin to 260mcg daily  * Demand ischemia Thought to be NSTEMI but s/p Cath she has CAD but o lesions amenable to PCI   Troponin rose to 8   Heparin drip stopped.  * Strep viridans bacteremia - Endocarditis?  On IV abx. Appreciate ID recommendations. Echo showed no  endocarditis. Repeat Bcx pending. TEE on Monday  * Depression and anxiety: Patient continue on outpatient medications.  * Constipation - Add colace-Senna and Miralax  All the records are reviewed and case discussed with Care Management/Social Workerr. Management plans discussed with the patient, family and they are in agreement.  CODE STATUS: limited ( DNI)  TOTAL TIME TAKING CARE OF THIS PATIENT: 35 minutes.   Hillary Bow R M.D on 11/28/2014   Between 7am to 6pm - Pager - [phone]  After 6pm go to [email or web address] - password [email or web address] [facility]  Office  [phone]  CC: Primary care physician; Wilhemena Durie, MD

## 2014-11-29 LAB — CULTURE, BLOOD (ROUTINE X 2): Culture: NO GROWTH

## 2014-11-29 LAB — VANCOMYCIN, TROUGH: Vancomycin Tr: 31 ug/mL (ref 10–20)

## 2014-11-29 MED ORDER — ALTEPLASE 2 MG IJ SOLR
2.0000 mg | Freq: Once | INTRAMUSCULAR | Status: AC
  Administered 2014-11-29: 2 mg
  Filled 2014-11-29: qty 2

## 2014-11-29 MED ORDER — VANCOMYCIN HCL IN DEXTROSE 1-5 GM/200ML-% IV SOLN
1000.0000 mg | INTRAVENOUS | Status: DC
  Filled 2014-11-29 (×2): qty 200

## 2014-11-29 MED ORDER — STERILE WATER FOR INJECTION IJ SOLN
INTRAMUSCULAR | Status: AC
  Administered 2014-11-29: 10 mL
  Filled 2014-11-29: qty 10

## 2014-11-29 NOTE — Progress Notes (Signed)
Patient has tried to void twice unsuccessfully. Bladder scan performed and shows she has 800 ml urine present. I called Dr. Darvin Neighbours and order received for an in and out catheter x 1.

## 2014-11-29 NOTE — Progress Notes (Signed)
ANTIBIOTIC CONSULT NOTE - FOLLOW UP   Pharmacy Consult for Vancomycin Indication: endocarditis  Allergies  Allergen Reactions  . Atorvastatin Other (See Comments)  . Codeine   . Ceftin  [Cefuroxime Axetil] Other (See Comments)    Patient Measurements: Height: 5\' 5"  (165.1 cm) Weight: 173 lb 12.8 oz (78.835 kg) (upon transfer) IBW/kg (Calculated) : 57   Vital Signs: Temp: 97.6 F (36.4 C) (08/14 2000) Temp Source: Oral (08/14 2000) BP: 117/51 mmHg (08/14 2200) Pulse Rate: 60 (08/14 2200) Intake/Output from previous day: 08/13 0701 - 08/14 0700 In: 495 [P.O.:95; IV Piggyback:400] Out: 2100 [Urine:2100] Intake/Output from this shift: Total I/O In: 240 [P.O.:240] Out: -   Labs:  Recent Labs  11/27/14 0458 11/28/14 0506  WBC 11.6*  --   HGB 9.6*  --   PLT 202  --   CREATININE 0.74 0.91   Estimated Creatinine Clearance: 54.5 mL/min (by C-G formula based on Cr of 0.91).  Recent Labs  11/27/14 1150 11/29/14 2149  [place] 13 31*     Microbiology: Recent Results (from the past 720 hour(s))  MRSA PCR Screening     Status: None   Collection Time: 11/23/14  3:18 PM  Result Value Ref Range Status   MRSA by PCR NEGATIVE NEGATIVE Final    Comment:        The GeneXpert MRSA Assay (FDA approved for NASAL specimens only), is one component of a comprehensive MRSA colonization surveillance program. It is not intended to diagnose MRSA infection nor to guide or monitor treatment for MRSA infections.   Culture, blood (routine x 2)     Status: None   Collection Time: 11/24/14  7:07 AM  Result Value Ref Range Status   Specimen Description BLOOD BOTH 5ML  Final   Special Requests Immunocompromised RIGHT HAND  Final   Culture NO GROWTH 5 DAYS  Final   Report Status 11/29/2014 FINAL  Final  Culture, blood (routine x 2)     Status: None   Collection Time: 11/24/14  7:12 AM  Result Value Ref Range Status   Specimen Description BLOOD BOTH 5ML  Final   Special  Requests Immunocompromised LEFT HAND  Final   Culture  Setup Time   Final    GRAM POSITIVE COCCI ANAEROBIC BOTTLE ONLY CRITICAL RESULT CALLED TO, READ BACK BY AND VERIFIED WITH: IRIS GUIDRY @ 1610 8.9.16 MPG    Culture   Final    VIRIDANS STREPTOCOCCUS SENSITIVITIES PERFORMED PER DR. FITZGERALD    Report Status 11/28/2014 FINAL  Final   Organism ID, Bacteria VIRIDANS STREPTOCOCCUS  Final      Susceptibility   Viridans streptococcus - MIC*    ERYTHROMYCIN Value in next row Sensitive      SENSITIVE0.12    VANCOMYCIN Value in next row Sensitive      SENSITIVE0.5    LEVOFLOXACIN Value in next row Sensitive      SENSITIVE2    CEFTRIAXONE Value in next row Sensitive      SENSITIVE0.12    PENICILLIN Value in next row Sensitive      SENSITIVE0.12    * VIRIDANS STREPTOCOCCUS  Culture, blood (routine x 2)     Status: None (Preliminary result)   Collection Time: 11/26/14  8:11 AM  Result Value Ref Range Status   Specimen Description BLOOD LEFT WRIST  Final   Special Requests BOTTLES DRAWN AEROBIC AND ANAEROBIC  7 CC  Final   Culture NO GROWTH 3 DAYS  Final   Report Status  PENDING  Incomplete  Culture, blood (routine x 2)     Status: None (Preliminary result)   Collection Time: 11/26/14  8:25 AM  Result Value Ref Range Status   Specimen Description BLOOD RIGHT HAND  Final   Special Requests BOTTLES DRAWN AEROBIC AND ANAEROBIC  5 CC  Final   Culture NO GROWTH 3 DAYS  Final   Report Status PENDING  Incomplete    Medical History: Past Medical History  Diagnosis Date  . Anxiety   . Depression   . A-fib   . Arthritis   . Hypertension   . Hyperlipidemia   . Thyroid disease   . CAD (coronary artery disease)     Medications:  Scheduled:  . alteplase  2 mg Intracatheter Once  . alteplase  2 mg Intracatheter Once  . amiodarone  400 mg Oral BID  . amitriptyline  100 mg Oral QHS  . apixaban  5 mg Oral BID  . digoxin  0.25 mg Oral Daily  . furosemide  40 mg Oral BID  .  isosorbide mononitrate  30 mg Oral Daily  . levothyroxine  150 mcg Oral QAC breakfast  . rosuvastatin  10 mg Oral Daily  . senna-docusate  1 tablet Oral BID  . sodium chloride  10-40 mL Intracatheter Q12H  . traMADol  50 mg Oral TID  . [START ON 11/30/2014] vancomycin  1,000 mg Intravenous Q24H  . vitamin C  250 mg Oral Daily   Infusions:    Assessment: Pharmacy consulted to dose vancomycin in 74 yo female with fever, leukocytosis and bacteremia initiated on vancomycin for strep viridans bacteremia. Organism is sensitive to Rocephin; however patient has allergy to Ceftin.     Goal of Therapy:  Vancomycin trough level 15-20 mcg/ml  Plan:  Vancomycin level resulted @ 13 mcg/ml on Vancomycin 750 mg IV q12 hours. Will change to vancomycin 1 g iv q12 hours. Will order vancomycin trough level prior to the 22:00 dose on 8/14.   8/14 PM vanc level 31. Changed to q 24 hours. Level ordered before AM dose to confirm clearance.  Pharmacy will continue to monitor and adjust per consult.    Yader Criger S 11/29/2014,10:40 PM

## 2014-11-29 NOTE — Progress Notes (Signed)
In and out catheter performed and 625 ml clear yellow urine returned.

## 2014-11-29 NOTE — Progress Notes (Signed)
ANTIBIOTIC CONSULT NOTE - FOLLOW UP   Pharmacy Consult for Vancomycin Indication: endocarditis  Allergies  Allergen Reactions  . Atorvastatin Other (See Comments)  . Codeine   . Ceftin  [Cefuroxime Axetil] Other (See Comments)    Patient Measurements: Height: 5\' 5"  (165.1 cm) Weight: 173 lb 12.8 oz (78.835 kg) (upon transfer) IBW/kg (Calculated) : 57   Vital Signs: Temp: 98.1 F (36.7 C) (08/14 0800) Temp Source: Oral (08/14 0500) BP: 110/60 mmHg (08/14 0600) Pulse Rate: 63 (08/14 0600) Intake/Output from previous day: 08/13 0701 - 08/14 0700 In: 495 [P.O.:95; IV Piggyback:400] Out: 2100 [Urine:2100] Intake/Output from this shift:    Labs:  Recent Labs  11/27/14 0458 11/28/14 0506  WBC 11.6*  --   HGB 9.6*  --   PLT 202  --   CREATININE 0.74 0.91   Estimated Creatinine Clearance: 54.5 mL/min (by C-G formula based on Cr of 0.91).  Recent Labs  11/27/14 1150  [place]     Microbiology: Recent Results (from the past 720 hour(s))  MRSA PCR Screening     Status: None   Collection Time: 11/23/14  3:18 PM  Result Value Ref Range Status   MRSA by PCR NEGATIVE NEGATIVE Final    Comment:        The GeneXpert MRSA Assay (FDA approved for NASAL specimens only), is one component of a comprehensive MRSA colonization surveillance program. It is not intended to diagnose MRSA infection nor to guide or monitor treatment for MRSA infections.   Culture, blood (routine x 2)     Status: None   Collection Time: 11/24/14  7:07 AM  Result Value Ref Range Status   Specimen Description BLOOD BOTH 5ML  Final   Special Requests Immunocompromised RIGHT HAND  Final   Culture NO GROWTH 5 DAYS  Final   Report Status 11/29/2014 FINAL  Final  Culture, blood (routine x 2)     Status: None   Collection Time: 11/24/14  7:12 AM  Result Value Ref Range Status   Specimen Description BLOOD BOTH 5ML  Final   Special Requests Immunocompromised LEFT HAND  Final   Culture   Setup Time   Final    GRAM POSITIVE COCCI ANAEROBIC BOTTLE ONLY CRITICAL RESULT CALLED TO, READ BACK BY AND VERIFIED WITH: IRIS GUIDRY @ 8315 8.9.16 MPG    Culture   Final    VIRIDANS STREPTOCOCCUS SENSITIVITIES PERFORMED PER DR. FITZGERALD    Report Status 11/28/2014 FINAL  Final   Organism ID, Bacteria VIRIDANS STREPTOCOCCUS  Final      Susceptibility   Viridans streptococcus - MIC*    ERYTHROMYCIN Value in next row Sensitive      SENSITIVE0.12    VANCOMYCIN Value in next row Sensitive      SENSITIVE0.5    LEVOFLOXACIN Value in next row Sensitive      SENSITIVE2    CEFTRIAXONE Value in next row Sensitive      SENSITIVE0.12    PENICILLIN Value in next row Sensitive      SENSITIVE0.12    * VIRIDANS STREPTOCOCCUS  Culture, blood (routine x 2)     Status: None (Preliminary result)   Collection Time: 11/26/14  8:11 AM  Result Value Ref Range Status   Specimen Description BLOOD LEFT WRIST  Final   Special Requests BOTTLES DRAWN AEROBIC AND ANAEROBIC  7 CC  Final   Culture NO GROWTH 3 DAYS  Final   Report Status PENDING  Incomplete  Culture, blood (routine x 2)  Status: None (Preliminary result)   Collection Time: 11/26/14  8:25 AM  Result Value Ref Range Status   Specimen Description BLOOD RIGHT HAND  Final   Special Requests BOTTLES DRAWN AEROBIC AND ANAEROBIC  5 CC  Final   Culture NO GROWTH 3 DAYS  Final   Report Status PENDING  Incomplete    Medical History: Past Medical History  Diagnosis Date  . Anxiety   . Depression   . A-fib   . Arthritis   . Hypertension   . Hyperlipidemia   . Thyroid disease   . CAD (coronary artery disease)     Medications:  Scheduled:  . amiodarone  400 mg Oral BID  . amitriptyline  100 mg Oral QHS  . apixaban  5 mg Oral BID  . digoxin  0.25 mg Oral Daily  . furosemide  40 mg Oral BID  . isosorbide mononitrate  30 mg Oral Daily  . levothyroxine  150 mcg Oral QAC breakfast  . rosuvastatin  10 mg Oral Daily  . senna-docusate  1  tablet Oral BID  . sodium chloride  10-40 mL Intracatheter Q12H  . traMADol  50 mg Oral TID  . vancomycin  1,000 mg Intravenous Q12H  . vitamin C  250 mg Oral Daily   Infusions:    Assessment: Pharmacy consulted to dose vancomycin in 74 yo female with fever, leukocytosis and bacteremia initiated on vancomycin for strep viridans bacteremia. Organism is sensitive to Rocephin; however patient has allergy to Ceftin.     Goal of Therapy:  Vancomycin trough level 15-20 mcg/ml  Plan:  Vancomycin level resulted @ 13 mcg/ml on Vancomycin 750 mg IV q12 hours. Will change to vancomycin 1 g iv q12 hours. Will order vancomycin trough level prior to the 22:00 dose on 8/14.    Pharmacy will continue to monitor and adjust per consult.    Anasofia Micallef D 11/29/2014,11:24 AM

## 2014-11-29 NOTE — Progress Notes (Signed)
[place] at [place] NAME: Ruth Hodges    MR#:  [id number]  DATE OF BIRTH:  Apr 23, 1940  SUBJECTIVE:  CHIEF COMPLAINT:   Chief Complaint  Patient presents with  . Chest Pain   Admitted for Afib, NSTEMI. Acute decompensation with Afib RVR and CHF 11/25/2014. S/p Cath today. No PCI. Converted to NSR on amiodarone drip and changed to PO 8/12, Now in NSR Feels very tired. No other concerns.  REVIEW OF SYSTEMS:   CONSTITUTIONAL: No fever. Positive  Fatigue. EYES: No blurred or double vision.  EARS, NOSE, AND THROAT: No tinnitus or ear pain.  RESPIRATORY: No cough, positive occasional shortness of breath, no wheezing or hemoptysis.  CARDIOVASCULAR: No chest pain, no orthopnea or edema.  GASTROINTESTINAL: No nausea, vomiting, diarrhea or abdominal pain.  GENITOURINARY: No dysuria, hematuria.  ENDOCRINE: No polyuria, nocturia,  HEMATOLOGY: No anemia, positive easy bruising no bleeding SKIN: No rash or lesion. MUSCULOSKELETAL: No joint pain or arthritis.  NEUROLOGIC: No tingling, numbness, weakness.  PSYCHIATRY: Positive anxiety and depression.   ROS  DRUG ALLERGIES:   Allergies  Allergen Reactions  . Atorvastatin Other (See Comments)  . Codeine   . Ceftin  [Cefuroxime Axetil] Other (See Comments)    VITALS:  Blood pressure 110/60, pulse 63, temperature 98.1 F (36.7 C), temperature source Oral, resp. rate 17, height 5\' 5"  (1.651 m), weight 78.835 kg (173 lb 12.8 oz), SpO2 95 %.  PHYSICAL EXAMINATION:  GENERAL: 74 y.o.-year-old patient lying in the bed. EYES: Pupils equal, round, reactive to light and accommodation. No scleral icterus. Extraocular muscles intact.  HEENT: Head atraumatic, normocephalic. Oropharynx and nasopharynx clear.  NECK: Supple, no jugular venous distention. No thyroid enlargement, no tenderness.  LUNGS: Normal breath sounds bilaterally, no wheezing, rales,rhonchi or crepitation. No use  of accessory muscles of respiration.  CARDIOVASCULAR: S1 S2, 2/6 SEM no rubs, or gallops. regular. ABDOMEN: Soft, nontender, nondistended. Bowel sounds present. No organomegaly or mass.  EXTREMITIES: No pedal edema, cyanosis, or clubbing.  NEUROLOGIC: Cranial nerves II through XII are grossly intact. No focal deficits. PSYCHIATRIC: Alert and oriented x 3. Anxious SKIN: No obvious rash, lesion, or ulcer.   Physical Exam LABORATORY PANEL:   CBC  Recent Labs Lab 11/27/14 0458  WBC 11.6*  HGB 9.6*  HCT 30.5*  PLT 202   ------------------------------------------------------------------------------------------------------------------  Chemistries   Recent Labs Lab 11/28/14 0506  NA 137  K 3.8  CL 98*  CO2 26  GLUCOSE 145*  BUN 23*  CREATININE 0.91  CALCIUM 9.2  MG 1.9   ------------------------------------------------------------------------------------------------------------------  Cardiac Enzymes  Recent Labs Lab 11/23/14 2155 11/24/14 0336  TROPONINI 6.68* 8.43*   ------------------------------------------------------------------------------------------------------------------  RADIOLOGY:  No results found.  ASSESSMENT AND PLAN:   * Acute on chronic diastolic chf - IV Lasix, Beta blockers - Changed to PO lasix 11/28/2014. - Input and Output - Counseled to limit fluids and Salt. - Monitor Bun/Cr and Potassium  * Acute respiratory failure due to CHF O2 for sats >92%  * Atrial fibrillation RVR Converted to NSR on amiodarone drip. Changed to PO amiodarone. Restarted Eliquis. Increased digoxin to 232mcg daily  * Demand ischemia Thought to be NSTEMI but s/p Cath she has CAD but o lesions amenable to PCI   Troponin rose to 8   Heparin drip stopped.  * Strep viridans bacteremia - Endocarditis? On IV abx. Appreciate ID recommendations. Echo showed no endocarditis. Repeat Bcx pending. TEE on Monday  * Depression and anxiety:  Patient continue on  outpatient medications.  * Constipation - Add colace-Senna and Miralax  All the records are reviewed and case discussed with Care Management/Social Workerr. Management plans discussed with the patient, family and they are in agreement.  CODE STATUS: limited ( DNI)  TOTAL TIME TAKING CARE OF THIS PATIENT: 35 minutes.   Hillary Bow R M.D on 11/29/2014   Between 7am to 6pm - Pager - [phone]  After 6pm go to [email or web address] - password [email or web address] [facility]  Office  [phone]  CC: Primary care physician; Wilhemena Durie, MD

## 2014-11-29 NOTE — Progress Notes (Signed)
[facility]  SUBJECTIVE: more relaxed this am. Denies sob of chest pain For TEE in am to evaluate for possible sbe per infectioius disease    Filed Vitals:   11/29/14 0400 11/29/14 0500 11/29/14 0600 11/29/14 0800  BP: 90/71  110/60   Pulse: 65 61 63   Temp:  97.9 F (36.6 C)  98.1 F (36.7 C)  TempSrc:  Oral    Resp: 24 18 17    Height:      Weight:      SpO2: 95% 89% 95%     Intake/Output Summary (Last 24 hours) at 11/29/14 1136 Last data filed at 11/29/14 0600  Gross per 24 hour  Intake    400 ml  Output   2100 ml  Net  -1700 ml    LABS: Basic Metabolic Panel:  Recent Labs  11/27/14 0458 11/28/14 0506  NA 140 137  K 4.2 3.8  CL 107 98*  CO2 27 26  GLUCOSE 113* 145*  BUN 18 23*  CREATININE 0.74 0.91  CALCIUM 8.4* 9.2  MG 1.9 1.9   Liver Function Tests: No results for input(s): AST, ALT, ALKPHOS, BILITOT, PROT, ALBUMIN in the last 72 hours. No results for input(s): LIPASE, AMYLASE in the last 72 hours. CBC:  Recent Labs  11/27/14 0458  WBC 11.6*  HGB 9.6*  HCT 30.5*  MCV 82.5  PLT 202   Cardiac Enzymes: No results for input(s): CKTOTAL, CKMB, CKMBINDEX, TROPONINI in the last 72 hours. BNP: Invalid input(s): POCBNP D-Dimer: No results for input(s): DDIMER in the last 72 hours. Hemoglobin A1C: No results for input(s): HGBA1C in the last 72 hours. Fasting Lipid Panel: No results for input(s): CHOL, HDL, LDLCALC, TRIG, CHOLHDL, LDLDIRECT in the last 72 hours. Thyroid Function Tests: No results for input(s): TSH, T4TOTAL, T3FREE, THYROIDAB in the last 72 hours.  Invalid input(s): FREET3 Anemia Panel: No results for input(s): VITAMINB12, FOLATE, FERRITIN, TIBC, IRON, RETICCTPCT in the last 72 hours.   Physical Exam: Blood pressure 110/60, pulse 63, temperature 98.1 F (36.7 C), temperature source Oral, resp. rate 17, height 5\' 5"  (1.651 m), weight 78.835 kg (173 lb 12.8 oz), SpO2 95 %.    General  appearance: alert and cooperative Resp: clear to auscultation bilaterally Cardio: regular rate and rhythm, S1, S2 normal, no murmur, click, rub or gallop GI: soft, non-tender; bowel sounds normal; no masses,  no organomegaly Extremities: extremities normal, atraumatic, no cyanosis or edema Neurologic: Grossly normal  TELEMETRY: Reviewed telemetry pt in av paced. NO further afib this am  ASSESSMENT AND PLAN:  Active Problems:   Atrial fibrillation-converted back to nsr on amiodarone . Agree with po amiodarone and eliquis.   Bactaremia-tee in am to evaluate for evidence of endocartis    Teodoro Spray., MD, [facility] 11/29/2014 11:36 AM

## 2014-11-30 MED ORDER — SODIUM CHLORIDE 0.9 % IJ SOLN: 3.0000 mL | INTRAMUSCULAR | Status: DC | PRN

## 2014-11-30 MED ORDER — SODIUM CHLORIDE 0.9 % WEIGHT BASED INFUSION: 3.0000 mL/kg/h | INTRAVENOUS | Status: DC

## 2014-11-30 MED ORDER — SODIUM CHLORIDE 0.9 % IJ SOLN: 3.0000 mL | Freq: Two times a day (BID) | INTRAMUSCULAR | Status: DC

## 2014-11-30 MED ORDER — ASPIRIN 81 MG PO CHEW: 81.0000 mg | CHEWABLE_TABLET | ORAL | Status: DC

## 2014-11-30 MED ORDER — SODIUM CHLORIDE 0.9 % IV SOLN: 250.0000 mL | INTRAVENOUS | Status: DC | PRN

## 2014-11-30 MED ORDER — APIXABAN 5 MG PO TABS: 5.0000 mg | ORAL_TABLET | Freq: Two times a day (BID) | ORAL | Status: DC

## 2014-11-30 MED ORDER — SODIUM CHLORIDE 0.9 % WEIGHT BASED INFUSION: 1.0000 mL/kg/h | INTRAVENOUS | Status: DC

## 2014-11-30 MED ORDER — ASPIRIN EC 325 MG PO TBEC: 325.0000 mg | DELAYED_RELEASE_TABLET | Freq: Every day | ORAL | Status: DC

## 2014-11-30 MED ORDER — DEXTROSE 5 % IV SOLN
2.0000 g | INTRAVENOUS | Status: DC
  Administered 2014-11-30 – 2014-12-01 (×2): 2 g via INTRAVENOUS
  Filled 2014-11-30 (×3): qty 2

## 2014-11-30 NOTE — Progress Notes (Signed)
[facility] [place] NAME: Ruth Hodges    MR#:  [id number]  DATE OF BIRTH:  [birth year]  SUBJECTIVE:  CHIEF COMPLAINT:   Chief Complaint  Patient presents with  . Chest Pain   Admitted for Afib, NSTEMI. Acute decompensation with Afib RVR and CHF 11/25/2014. S/p Cath today. No PCI. Converted to NSR on amiodarone drip and changed to PO 8/12, Now in NSR Feels very tired. No other concerns.  REVIEW OF SYSTEMS:   CONSTITUTIONAL: No fever. Positive  Fatigue. EYES: No blurred or double vision.  EARS, NOSE, AND THROAT: No tinnitus or ear pain.  RESPIRATORY: No cough, positive occasional shortness of breath, no wheezing or hemoptysis.  CARDIOVASCULAR: No chest pain, no orthopnea or edema.  GASTROINTESTINAL: No nausea, vomiting, diarrhea or abdominal pain.  GENITOURINARY: No dysuria, hematuria.  ENDOCRINE: No polyuria, nocturia,  HEMATOLOGY: No anemia, positive easy bruising no bleeding SKIN: No rash or lesion. MUSCULOSKELETAL: No joint pain or arthritis.  NEUROLOGIC: No tingling, numbness, weakness.  PSYCHIATRY: Positive anxiety and depression.   ROS  DRUG ALLERGIES:   Allergies  Allergen Reactions  . Atorvastatin Other (See Comments)  . Codeine   . Ceftin  [Cefuroxime Axetil] Other (See Comments)    VITALS:  Blood pressure 122/37, pulse 60, temperature 97.8 F (36.6 C), temperature source Oral, resp. rate 14, height 5\' 5"  (1.651 m), weight 78.835 kg (173 lb 12.8 oz), SpO2 96 %.  PHYSICAL EXAMINATION:  GENERAL: 74 y.o.-year-old patient lying in the bed. EYES: Pupils equal, round, reactive to light and accommodation. No scleral icterus. Extraocular muscles intact.  HEENT: Head atraumatic, normocephalic. Oropharynx and nasopharynx clear.  NECK: Supple, no jugular venous distention. No thyroid enlargement, no tenderness.  LUNGS: Normal breath sounds bilaterally, no wheezing, rales,rhonchi or crepitation. No use  of accessory muscles of respiration.  CARDIOVASCULAR: S1 S2, 2/6 SEM no rubs, or gallops. regular. ABDOMEN: Soft, nontender, nondistended. Bowel sounds present. No organomegaly or mass.  EXTREMITIES: No pedal edema, cyanosis, or clubbing.  NEUROLOGIC: Cranial nerves II through XII are grossly intact. No focal deficits. PSYCHIATRIC: Alert and oriented x 3. Anxious SKIN: No obvious rash, lesion, or ulcer.   Physical Exam LABORATORY PANEL:   CBC  Recent Labs Lab 11/27/14 0458  WBC 11.6*  HGB 9.6*  HCT 30.5*  PLT 202   ------------------------------------------------------------------------------------------------------------------  Chemistries   Recent Labs Lab 11/28/14 0506  NA 137  K 3.8  CL 98*  CO2 26  GLUCOSE 145*  BUN 23*  CREATININE 0.91  CALCIUM 9.2  MG 1.9   ------------------------------------------------------------------------------------------------------------------  Cardiac Enzymes  Recent Labs Lab 11/23/14 2155 11/24/14 0336  TROPONINI 6.68* 8.43*   ------------------------------------------------------------------------------------------------------------------  RADIOLOGY:  No results found.  ASSESSMENT AND PLAN:   * Acute on chronic diastolic chf - IV Lasix, Beta blockers - Changed to PO lasix 11/28/2014. - Input and Output - Counseled to limit fluids and Salt. - Monitor Bun/Cr and Potassium  * Acute respiratory failure due to CHF O2 for sats >92%  * Atrial fibrillation RVR Converted to NSR on amiodarone drip. Changed to PO amiodarone. Restarted Eliquis. Digoxin 217mcg daily  * Demand ischemia Thought to be NSTEMI but s/p Cath she has CAD but o lesions amenable to PCI   Troponin rose to 8   Heparin drip stopped.  * Strep viridans bacteremia - Endocarditis? On IV abx. Appreciate ID recommendations. Echo showed no endocarditis. Repeat Bcx pending. TEE tomorrow as patient ate breakfast today Discussed with Dr.  Fitzgerald  and Dr. Ubaldo Glassing.  * Depression and anxiety: Patient continue on outpatient medications.  * Constipation - Add colace-Senna and Miralax  All the records are reviewed and case discussed with Care Management/Social Workerr. Management plans discussed with the patient, family and they are in agreement.  CODE STATUS: limited ( DNI)  TOTAL TIME TAKING CARE OF THIS PATIENT: 35 minutes.   Hillary Bow R M.D on 11/30/2014   Between 7am to 6pm - Pager - [phone]  After 6pm go to [email or web address] - password [email or web address] [facility]  Office  [phone]  CC: Primary care physician; Wilhemena Durie, MD

## 2014-11-30 NOTE — Progress Notes (Signed)
[place] INFECTIOUS DISEASE PROGRESS NOTE Date of Admission:  11/23/2014     ID: Ruth Hodges is a 74 y.o. female with  bacteremia Active Problems:   Atrial fibrillation   Subjective: Remains afebrile, clincally improving. For TEE.  ROS  Eleven systems are reviewed and negative except per hpi  Medications:  Antibiotics Given (last 72 hours)    Date/Time Action Medication Dose Rate   11/27/14 2202 Given   vancomycin (VANCOCIN) IVPB 750 mg/150 ml premix 750 mg 150 mL/hr   11/28/14 1021 Given   vancomycin (VANCOCIN) IVPB 1000 mg/200 mL premix 1,000 mg 200 mL/hr   11/28/14 2200 Given   vancomycin (VANCOCIN) IVPB 1000 mg/200 mL premix 1,000 mg 200 mL/hr   11/29/14 1038 Given   vancomycin (VANCOCIN) IVPB 1000 mg/200 mL premix 1,000 mg 200 mL/hr   11/30/14 1302 Given   cefTRIAXone (ROCEPHIN) 2 g in dextrose 5 % 50 mL IVPB 2 g 100 mL/hr     . amiodarone  400 mg Oral BID  . amitriptyline  100 mg Oral QHS  . apixaban  5 mg Oral BID  . cefTRIAXone (ROCEPHIN)  IV  2 g Intravenous Q24H  . digoxin  0.25 mg Oral Daily  . furosemide  40 mg Oral BID  . isosorbide mononitrate  30 mg Oral Daily  . levothyroxine  150 mcg Oral QAC breakfast  . rosuvastatin  10 mg Oral Daily  . senna-docusate  1 tablet Oral BID  . sodium chloride  10-40 mL Intracatheter Q12H  . traMADol  50 mg Oral TID  . vitamin C  250 mg Oral Daily    Objective: Vital signs in last 24 hours: Temp:  [97.5 F (36.4 C)-97.8 F (36.6 C)] 97.8 F (36.6 C) (08/15 0800) Pulse Rate:  [58-123] 60 (08/15 1045) Resp:  [14-23] 14 (08/15 0800) BP: (112-144)/(37-83) 122/37 mmHg (08/15 0800) SpO2:  [90 %-99 %] 96 % (08/15 0800) Constitutional: oriented to person, place, and time. Dishelved, ill appearing HENT: [place]/AT, PERRLA, no scleral icterus Mouth/Throat: Oropharynx is clear and very dry, poor dentition. No oropharyngeal exudate.  Cardiovascular: Normal rate,3.6 sm Pulmonary/Chest: Effort normal and breath sounds  normal. No respiratory distress. has no wheezes.  Neck supple, no nuchal rigidity Access i PICC RUE Abdominal: Soft. Bowel sounds are normal. exhibits no distension. There is no tenderness.  Lymphadenopathy: no cervical adenopathy. No axillary adenopathy Neurological: alert and oriented to person, place, and time.  Skin: Skin is warm and dry. No rash noted. No erythema. Possible splinter hemorrhage on R hand fingernail Psychiatric: a normal mood and affect. behavior is normal.   Lab Results  Recent Labs  11/28/14 0506  NA 137  K 3.8  CL 98*  CO2 26  BUN 23*  CREATININE 0.91    Microbiology: Results for orders placed or performed during the hospital encounter of 11/23/14  MRSA PCR Screening     Status: None   Collection Time: 11/23/14  3:18 PM  Result Value Ref Range Status   MRSA by PCR NEGATIVE NEGATIVE Final    Comment:        The GeneXpert MRSA Assay (FDA approved for NASAL specimens only), is one component of a comprehensive MRSA colonization surveillance program. It is not intended to diagnose MRSA infection nor to guide or monitor treatment for MRSA infections.   Culture, blood (routine x 2)     Status: None   Collection Time: 11/24/14  7:07 AM  Result Value Ref Range Status   Specimen Description  BLOOD BOTH 5ML  Final   Special Requests Immunocompromised RIGHT HAND  Final   Culture NO GROWTH 5 DAYS  Final   Report Status 11/29/2014 FINAL  Final  Culture, blood (routine x 2)     Status: None   Collection Time: 11/24/14  7:12 AM  Result Value Ref Range Status   Specimen Description BLOOD BOTH 5ML  Final   Special Requests Immunocompromised LEFT HAND  Final   Culture  Setup Time   Final    GRAM POSITIVE COCCI ANAEROBIC BOTTLE ONLY CRITICAL RESULT CALLED TO, READ BACK BY AND VERIFIED WITH: IRIS GUIDRY @ 7342 8.9.16 MPG    Culture   Final    VIRIDANS STREPTOCOCCUS SENSITIVITIES PERFORMED PER DR. Sidonie Dexheimer    Report Status 11/28/2014 FINAL  Final    Organism ID, Bacteria VIRIDANS STREPTOCOCCUS  Final      Susceptibility   Viridans streptococcus - MIC*    ERYTHROMYCIN Value in next row Sensitive      SENSITIVE0.12    VANCOMYCIN Value in next row Sensitive      SENSITIVE0.5    LEVOFLOXACIN Value in next row Sensitive      SENSITIVE2    CEFTRIAXONE Value in next row Sensitive      SENSITIVE0.12    PENICILLIN Value in next row Sensitive      SENSITIVE0.12    * VIRIDANS STREPTOCOCCUS  Culture, blood (routine x 2)     Status: None (Preliminary result)   Collection Time: 11/26/14  8:11 AM  Result Value Ref Range Status   Specimen Description BLOOD LEFT WRIST  Final   Special Requests BOTTLES DRAWN AEROBIC AND ANAEROBIC  7 CC  Final   Culture NO GROWTH 4 DAYS  Final   Report Status PENDING  Incomplete  Culture, blood (routine x 2)     Status: None (Preliminary result)   Collection Time: 11/26/14  8:25 AM  Result Value Ref Range Status   Specimen Description BLOOD RIGHT HAND  Final   Special Requests BOTTLES DRAWN AEROBIC AND ANAEROBIC  5 CC  Final   Culture NO GROWTH 4 DAYS  Final   Report Status PENDING  Incomplete     Studies/Results: No results found.  Assessment/Plan: Ruth Hodges is a 74 y.o. female with history of sick sinus syndrome status post pacemaker and atrial fibrillation on anticoagulation who presents 8/8 with palpitations and chest pain. Found to be in RVR with HR in 150s. She also had a leukocytosis. She was treated with amio and stabilized but then had repeat decompensation.  Since admission 1/2 bcx + GPC. She has had a fever to 102. WBC was elevated but decreasing some. She had a recent skin outbreak from sunscreen where she says her skin was all inflamed and she was treated with steroids. She does not report any purulence or boils with this outbreak and it has responded nicely to prednisone. She has not had any recent procedures or dental work but does have some ongoing poor dentition.  CT chest neg for  PNA. UA negative  I am concerned for endocarditis given the valvular heart disease, PPM, Fever, viridans strep bacterami and possible splinter hemorrhage in setting of recent skin eruption.  Recommendations Repeat bcx x 2 - negative  Await TEE - If + would need 4 weeks IV ceftriaxone If TEE negative would give levofloxacin for another 5 days orally Thank you very much for the consult. Will follow with you.  [place], [place]   11/30/2014, 2:21 PM

## 2014-11-30 NOTE — Progress Notes (Signed)
Pt being transferred to Rm 237. No c/o pain or distress noted report given.

## 2014-12-01 ENCOUNTER — Encounter: Payer: Self-pay | Admitting: Emergency Medicine

## 2014-12-01 ENCOUNTER — Observation Stay
Admission: EM | Admit: 2014-12-01 | Discharge: 2014-12-02 | Disposition: A | Discharge status: Skilled Nursing Facility | Payer: Medicare Other | Attending: Internal Medicine | Admitting: Internal Medicine

## 2014-12-01 ENCOUNTER — Emergency Department: Payer: Medicare Other

## 2014-12-01 ENCOUNTER — Inpatient Hospital Stay
Admit: 2014-12-01 | Discharge: 2014-12-01 | Disposition: A | Discharge status: Home / Self Care | Payer: Medicare Other | Attending: Cardiology | Admitting: Cardiology

## 2014-12-01 ENCOUNTER — Telehealth: Payer: Self-pay | Admitting: Family Medicine

## 2014-12-01 ENCOUNTER — Encounter
Admission: EM | Disposition: A | Discharge status: Home Health | Payer: Self-pay | Source: Home / Self Care | Attending: Internal Medicine

## 2014-12-01 DIAGNOSIS — M8580 Other specified disorders of bone density and structure, unspecified site: Secondary | ICD-10-CM | POA: Insufficient documentation

## 2014-12-01 DIAGNOSIS — R778 Other specified abnormalities of plasma proteins: Secondary | ICD-10-CM | POA: Diagnosis present

## 2014-12-01 DIAGNOSIS — M1990 Unspecified osteoarthritis, unspecified site: Secondary | ICD-10-CM | POA: Insufficient documentation

## 2014-12-01 DIAGNOSIS — R2681 Unsteadiness on feet: Secondary | ICD-10-CM | POA: Insufficient documentation

## 2014-12-01 DIAGNOSIS — K001 Supernumerary teeth: Secondary | ICD-10-CM | POA: Insufficient documentation

## 2014-12-01 DIAGNOSIS — E039 Hypothyroidism, unspecified: Secondary | ICD-10-CM | POA: Insufficient documentation

## 2014-12-01 DIAGNOSIS — Z823 Family history of stroke: Secondary | ICD-10-CM | POA: Insufficient documentation

## 2014-12-01 DIAGNOSIS — Z951 Presence of aortocoronary bypass graft: Secondary | ICD-10-CM | POA: Insufficient documentation

## 2014-12-01 DIAGNOSIS — S0191XA Laceration without foreign body of unspecified part of head, initial encounter: Secondary | ICD-10-CM | POA: Insufficient documentation

## 2014-12-01 DIAGNOSIS — R748 Abnormal levels of other serum enzymes: Secondary | ICD-10-CM | POA: Insufficient documentation

## 2014-12-01 DIAGNOSIS — R51 Headache: Secondary | ICD-10-CM | POA: Insufficient documentation

## 2014-12-01 DIAGNOSIS — D72829 Elevated white blood cell count, unspecified: Secondary | ICD-10-CM | POA: Insufficient documentation

## 2014-12-01 DIAGNOSIS — R7989 Other specified abnormal findings of blood chemistry: Secondary | ICD-10-CM

## 2014-12-01 DIAGNOSIS — Z950 Presence of cardiac pacemaker: Secondary | ICD-10-CM | POA: Insufficient documentation

## 2014-12-01 DIAGNOSIS — F419 Anxiety disorder, unspecified: Secondary | ICD-10-CM | POA: Insufficient documentation

## 2014-12-01 DIAGNOSIS — I482 Chronic atrial fibrillation: Secondary | ICD-10-CM | POA: Insufficient documentation

## 2014-12-01 DIAGNOSIS — F329 Major depressive disorder, single episode, unspecified: Secondary | ICD-10-CM | POA: Insufficient documentation

## 2014-12-01 DIAGNOSIS — R0902 Hypoxemia: Secondary | ICD-10-CM | POA: Diagnosis not present

## 2014-12-01 DIAGNOSIS — I5043 Acute on chronic combined systolic (congestive) and diastolic (congestive) heart failure: Secondary | ICD-10-CM | POA: Diagnosis not present

## 2014-12-01 DIAGNOSIS — I2510 Atherosclerotic heart disease of native coronary artery without angina pectoris: Secondary | ICD-10-CM | POA: Insufficient documentation

## 2014-12-01 DIAGNOSIS — Z7901 Long term (current) use of anticoagulants: Secondary | ICD-10-CM | POA: Insufficient documentation

## 2014-12-01 DIAGNOSIS — I495 Sick sinus syndrome: Secondary | ICD-10-CM | POA: Insufficient documentation

## 2014-12-01 DIAGNOSIS — S0990XA Unspecified injury of head, initial encounter: Secondary | ICD-10-CM | POA: Insufficient documentation

## 2014-12-01 DIAGNOSIS — E785 Hyperlipidemia, unspecified: Secondary | ICD-10-CM | POA: Insufficient documentation

## 2014-12-01 DIAGNOSIS — A491 Streptococcal infection, unspecified site: Secondary | ICD-10-CM | POA: Diagnosis present

## 2014-12-01 DIAGNOSIS — N179 Acute kidney failure, unspecified: Secondary | ICD-10-CM | POA: Insufficient documentation

## 2014-12-01 DIAGNOSIS — S0101XA Laceration without foreign body of scalp, initial encounter: Secondary | ICD-10-CM | POA: Insufficient documentation

## 2014-12-01 DIAGNOSIS — W19XXXA Unspecified fall, initial encounter: Secondary | ICD-10-CM | POA: Insufficient documentation

## 2014-12-01 DIAGNOSIS — I5022 Chronic systolic (congestive) heart failure: Secondary | ICD-10-CM | POA: Insufficient documentation

## 2014-12-01 DIAGNOSIS — R918 Other nonspecific abnormal finding of lung field: Secondary | ICD-10-CM | POA: Insufficient documentation

## 2014-12-01 DIAGNOSIS — R339 Retention of urine, unspecified: Secondary | ICD-10-CM | POA: Insufficient documentation

## 2014-12-01 DIAGNOSIS — Z8249 Family history of ischemic heart disease and other diseases of the circulatory system: Secondary | ICD-10-CM | POA: Insufficient documentation

## 2014-12-01 DIAGNOSIS — R42 Dizziness and giddiness: Secondary | ICD-10-CM

## 2014-12-01 DIAGNOSIS — I10 Essential (primary) hypertension: Secondary | ICD-10-CM | POA: Insufficient documentation

## 2014-12-01 DIAGNOSIS — J323 Chronic sphenoidal sinusitis: Secondary | ICD-10-CM | POA: Insufficient documentation

## 2014-12-01 HISTORY — PX: TEE WITHOUT CARDIOVERSION: SHX5443

## 2014-12-01 LAB — COMPREHENSIVE METABOLIC PANEL
ALBUMIN: 3.3 g/dL — AB (ref 3.5–5.0)
ALT: 28 U/L (ref 14–54)
ANION GAP: 11 (ref 5–15)
AST: 39 U/L (ref 15–41)
Alkaline Phosphatase: 164 U/L — ABNORMAL HIGH (ref 38–126)
BUN: 30 mg/dL — ABNORMAL HIGH (ref 6–20)
CO2: 25 mmol/L (ref 22–32)
Calcium: 9.2 mg/dL (ref 8.9–10.3)
Chloride: 99 mmol/L — ABNORMAL LOW (ref 101–111)
Creatinine, Ser: 1.13 mg/dL — ABNORMAL HIGH (ref 0.44–1.00)
GFR calc Af Amer: 53 mL/min — ABNORMAL LOW (ref >60)
GFR calc non Af Amer: 46 mL/min — ABNORMAL LOW (ref >60)
GLUCOSE: 115 mg/dL — AB (ref 65–99)
POTASSIUM: 4.5 mmol/L (ref 3.5–5.1)
SODIUM: 135 mmol/L (ref 135–145)
TOTAL PROTEIN: 7.5 g/dL (ref 6.5–8.1)
Total Bilirubin: 0.4 mg/dL (ref 0.3–1.2)

## 2014-12-01 LAB — CBC
HCT: 31.4 % — ABNORMAL LOW (ref 35.0–47.0)
HEMOGLOBIN: 10 g/dL — AB (ref 12.0–16.0)
MCH: 26 pg (ref 26.0–34.0)
MCHC: 31.7 g/dL — ABNORMAL LOW (ref 32.0–36.0)
MCV: 82 fL (ref 80.0–100.0)
Platelets: 282 10*3/uL (ref 150–440)
RBC: 3.83 MIL/uL (ref 3.80–5.20)
RDW: 16.2 % — ABNORMAL HIGH (ref 11.5–14.5)
WBC: 12.3 10*3/uL — ABNORMAL HIGH (ref 3.6–11.0)

## 2014-12-01 LAB — URINALYSIS COMPLETE WITH MICROSCOPIC (ARMC ONLY)
BILIRUBIN URINE: NEGATIVE
Glucose, UA: NEGATIVE mg/dL
HGB URINE DIPSTICK: NEGATIVE
Ketones, ur: NEGATIVE mg/dL
LEUKOCYTES UA: NEGATIVE
Nitrite: NEGATIVE
PH: 5 (ref 5.0–8.0)
Protein, ur: NEGATIVE mg/dL
RBC / HPF: NONE SEEN RBC/hpf (ref 0–5)
SPECIFIC GRAVITY, URINE: 1.016 (ref 1.005–1.030)

## 2014-12-01 LAB — CULTURE, BLOOD (ROUTINE X 2)
CULTURE: NO GROWTH
Culture: NO GROWTH

## 2014-12-01 LAB — PROTIME-INR
INR: 1.4
PROTHROMBIN TIME: 17.4 s — AB (ref 11.4–15.0)

## 2014-12-01 LAB — TROPONIN I: TROPONIN I: 0.08 ng/mL — AB (ref <0.031)

## 2014-12-01 SURGERY — ECHOCARDIOGRAM, TRANSESOPHAGEAL
Anesthesia: General

## 2014-12-01 MED ORDER — LIDOCAINE VISCOUS 2 % MT SOLN
OROMUCOSAL | Status: AC
  Filled 2014-12-01: qty 15

## 2014-12-01 MED ORDER — FENTANYL CITRATE (PF) 100 MCG/2ML IJ SOLN
INTRAMUSCULAR | Status: AC
  Filled 2014-12-01: qty 4

## 2014-12-01 MED ORDER — MORPHINE SULFATE (PF) 4 MG/ML IV SOLN
4.0000 mg | Freq: Once | INTRAVENOUS | Status: AC
Start: 2014-12-01 — End: 2014-12-01
  Administered 2014-12-01: 4 mg via INTRAVENOUS
  Filled 2014-12-01: qty 1

## 2014-12-01 MED ORDER — LEVOFLOXACIN 500 MG PO TABS: 500.0000 mg | ORAL_TABLET | Freq: Every day | ORAL | Status: DC

## 2014-12-01 MED ORDER — DIPHENHYDRAMINE HCL 25 MG PO CAPS
50.0000 mg | ORAL_CAPSULE | Freq: Four times a day (QID) | ORAL | Status: DC | PRN
  Administered 2014-12-01: 50 mg via ORAL
  Filled 2014-12-01: qty 2

## 2014-12-01 MED ORDER — MIDAZOLAM HCL 5 MG/5ML IJ SOLN
INTRAMUSCULAR | Status: AC
  Filled 2014-12-01: qty 10

## 2014-12-01 MED ORDER — TRAMADOL HCL 50 MG PO TABS: 50.0000 mg | ORAL_TABLET | Freq: Three times a day (TID) | ORAL | Status: DC

## 2014-12-01 MED ORDER — MIDAZOLAM HCL 2 MG/2ML IJ SOLN
INTRAMUSCULAR | Status: DC | PRN
  Administered 2014-12-01 (×2): 2 mg via INTRAVENOUS

## 2014-12-01 MED ORDER — BUTAMBEN-TETRACAINE-BENZOCAINE 2-2-14 % EX AERO
INHALATION_SPRAY | CUTANEOUS | Status: AC
  Filled 2014-12-01: qty 20

## 2014-12-01 MED ORDER — AMIODARONE HCL 400 MG PO TABS: 400.0000 mg | ORAL_TABLET | Freq: Every day | ORAL | Status: AC

## 2014-12-01 MED ORDER — DIGOXIN 250 MCG PO TABS: 0.2500 mg | ORAL_TABLET | Freq: Every day | ORAL | Status: DC

## 2014-12-01 MED ORDER — FENTANYL CITRATE (PF) 100 MCG/2ML IJ SOLN
INTRAMUSCULAR | Status: DC | PRN
Start: 2014-12-01 — End: 2014-12-01
  Administered 2014-12-01 (×2): 25 ug via INTRAVENOUS

## 2014-12-01 NOTE — Care Management (Signed)
Patient admitted for CHF.  Patient states that she is form [facility].  Patient obtains her medication at [facility] on [address].  Patient lives alone, but has a daughter close by for support.  Patient states that she does not have and home equipment or home O2.  Pt recommends SNF at time of discharge.  Per CSW note patient will be discharge to [facility].  No further CM needs

## 2014-12-01 NOTE — ED Notes (Signed)
Patient transported to CT 

## 2014-12-01 NOTE — Evaluation (Signed)
Physical Therapy Evaluation Patient Details Name: Ruth Hodges MRN: [id number] DOB: [birth year] Today's Date: 12/01/2014   History of Present Illness  Naevia Hodges  is a 74 y.o. female with a known history of sick sinus syndrome status post pacemaker and atrial fibrillation on anticoagulation who presented with complaints of heart palpitations and chest pain. Patient woke up with palpitations and chest pain radiating to her neck. In the emergency room she was noted to have atrial fibrillation and RVR heart rates were in the 150s. She was started on amiodarone drip due to low blood pressure she has now converted to normal sinus rhythm. She was seen and evaluated by cardiology while in the emergency room. She had a recent echocardiogram which showed normal ejection fraction with some mild to moderate valvular heart disease. Pt was admitted for acute on chronic CHF, acute respiratory failure due to CHF, afib with RVR, demand ischemia, and bactermia. She is being followed by cardiology and infectious disease. Pt has a discharge order in place. Pt reports living alone and being independent with ADLs. She requires assistance with transportation from daughter but states she is a full community ambulatory without an assistive device. Pt denies falls in the last 12 months.  Clinical Impression  Pt is very weak upon evaluation requiring assistance for transfers and ambulation. She has very poor balance and poor safety awareness. Pt is deconditioned and reports remaining in bed throughout length of her admission. When offered to get up to recliner pt refused. Pt will need SNF placement at discharge as she is very weak and is a very high fall risk. Pt will benefit from skilled PT services to address deficits in strength, balance, and mobility in order to return to full function at home.     Follow Up Recommendations SNF    Equipment Recommendations  None recommended by PT    Recommendations for Other Services        Precautions / Restrictions Precautions Precautions: Fall Restrictions Weight Bearing Restrictions: No      Mobility  Bed Mobility Overal bed mobility: Modified Independent             General bed mobility comments: Use of bed rails, extended time required to perform due to weakness  Transfers Overall transfer level: Needs assistance Equipment used: Rolling walker (2 wheeled) Transfers: Sit to/from Stand Sit to Stand: Mod assist         General transfer comment: Cues for hand placement and sequencing. Pt requires multiple attempts to come to standing with modA+1 required to prevent falling backwards. Very poor balance noted and very poor LE power with generalized weakness.  Ambulation/Gait Ambulation/Gait assistance: Mod assist Ambulation Distance (Feet): 20 Feet Assistive device: Rolling walker (2 wheeled) Gait Pattern/deviations: Step-to pattern;Decreased step length - right;Decreased step length - left   Gait velocity interpretation: <1.8 ft/sec, indicative of risk for recurrent falls General Gait Details: Pt with heavy forward trunk lean and crouched posture. Intermittent LE buckling with significant LE weakness noted. Poor safety awareness with walker requiring frequent cues to stay inside walker and for turns. Pt becomes increasingly fatigued. Vitals monitored and SaO2 remains >90% on room air. Pt is a high fall risk  Stairs            Wheelchair Mobility    Modified Rankin (Stroke Patients Only)       Balance Overall balance assessment: Needs assistance Sitting-balance support: No upper extremity supported;Feet supported Sitting balance-Leahy Scale: Fair Sitting balance - Comments: Pt with poor balance  to external perturbations   Standing balance support: No upper extremity supported (in wide stance) Standing balance-Leahy Scale: Fair           Rhomberg - Eyes Opened: 2     High Level Balance Comments: Pt able to achieve Rhomberg stance  but only able to hold for approximately 2 seconds before losing balance to the R             Pertinent Vitals/Pain Pain Assessment: No/denies pain    Home Living Family/patient expects to be discharged to:: Private residence Living Arrangements: Alone Available Help at Discharge: Family Type of Home: Apartment Home Access: Level entry     Home Layout: One level Home Equipment: Environmental consultant - 2 wheels;Grab bars - tub/shower (no cane, no BSC)      Prior Function Level of Independence: Independent               Hand Dominance        Extremity/Trunk Assessment   Upper Extremity Assessment: Generalized weakness           Lower Extremity Assessment: Generalized weakness (Grossly 4/5 throughout)         Communication   Communication: No difficulties  Cognition Arousal/Alertness: Awake/alert Behavior During Therapy: WFL for tasks assessed/performed Overall Cognitive Status: Within Functional Limits for tasks assessed                      General Comments      Exercises General Exercises - Lower Extremity Long Arc Quad: Strengthening;Both;10 reps;Seated Heel Slides: Strengthening;Both;10 reps;Seated Hip ABduction/ADduction: Strengthening;Both;10 reps;Seated Hip Flexion/Marching: Strengthening;Both;10 reps;Seated Heel Raises: Strengthening;Both;10 reps;Seated      Assessment/Plan    PT Assessment Patient needs continued PT services  PT Diagnosis Difficulty walking;Abnormality of gait;Generalized weakness   PT Problem List Decreased strength;Decreased activity tolerance;Decreased balance;Decreased knowledge of use of DME;Decreased safety awareness  PT Treatment Interventions DME instruction;Gait training;Stair training;Functional mobility training;Therapeutic activities;Therapeutic exercise;Balance training;Neuromuscular re-education;Patient/family education   PT Goals (Current goals can be found in the Care Plan section) Acute Rehab PT  Goals Patient Stated Goal: "I want to get home but I know I'm not safe right now" PT Goal Formulation: With patient Time For Goal Achievement: 12/15/14 Potential to Achieve Goals: Good    Frequency Min 2X/week   Barriers to discharge Decreased caregiver support      Co-evaluation               End of Session Equipment Utilized During Treatment: Gait belt Activity Tolerance: Patient limited by fatigue Patient left: in bed;with call bell/phone within reach;with bed alarm set Nurse Communication:  (Needs ice. CSW notified need SNF)         Time: 6378-5885 PT Time Calculation (min) (ACUTE ONLY): 22 min   Charges:   PT Evaluation $Initial PT Evaluation Tier I: 1 Procedure PT Treatments $Therapeutic Exercise: 8-22 mins   PT G Codes:       Lyndel Safe Huprich PT, DPT   Huprich,Jason 12/01/2014, 3:39 PM

## 2014-12-01 NOTE — ED Notes (Signed)
Dr. Williams notified of elevated troponin.

## 2014-12-01 NOTE — Discharge Instructions (Signed)

## 2014-12-01 NOTE — Clinical Social Work Note (Signed)
PT has assessed patient this afternoon and recommended STR. Patient states she will go to [facility] where her husband is. CSW has spoken to [place] at [facility] and they can take patient today. Discharge summary sent. Patient states her son in law will transport her. CSW notified physician. Shela Leff MSW,LCSW [phone]

## 2014-12-01 NOTE — Progress Notes (Signed)
Patient is discharge home with home PT ,and nurse, in a stable condition, no distress noted , denies pain at time of discharge, PICC  site good no bleeding noted, summary and f/u care given to both pt and son in-law, verbalized understanding .left with son -in-law

## 2014-12-01 NOTE — ED Notes (Signed)
Pt with c/o laceration to top head s/p fall.  Pt denies any LOC, neck or back pain.  Pt states she was just released from hospital yesterday.  Dr. Jimmye Norman at bedside upon pt arrival.  Pt with dried blood to head, chest.

## 2014-12-01 NOTE — Discharge Summary (Addendum)
[facility] [place] NAME: Ruth Hodges    MR#:  [id number]  DATE OF BIRTH:  10-20-1940  DATE OF ADMISSION:  11/23/2014 ADMITTING PHYSICIAN: Bettey Costa, MD  DATE OF DISCHARGE: No discharge date for patient encounter.  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 Anxiety   . Depression   . A-fib   . Arthritis   . Hypertension   . Hyperlipidemia   . Thyroid disease   . CAD (coronary artery disease)      ADMITTING HISTORY  Ruth Hodges is a 74 y.o. female with a known history of sick sinus syndrome status post pacemaker and atrial fibrillation on anticoagulation who presents with above complaint. Patient woke up this morning with palpitations and chest pain radiating to her neck. She continues to have chest pain and palpitations. In the emergency room she was noted to have atrial fibrillation and RVR heart rates were in the 150s. She was started on amiodarone drip due to low blood pressure she has now converted to normal sinus rhythm. She was seen and evaluated by cardiology while in the emergency room. She had a recent echocardiogram which showed normal ejection fraction with some mild to moderate valvular heart disease.   HOSPITAL COURSE:   * Acute on chronic diastolic chf - resolved - IV Lasix, Beta blockers - Changed to PO lasix 11/28/2014. - Input and Output - Counseled to limit fluids and Salt. - Monitor Bun/Cr and Potassium  * Acute respiratory failure due to CHF Now on RA with sats 97%  * Atrial fibrillation  RVR Patient was on amiodarone drip. Now changed to oral amiodarone and continue oral Cardizem. Digoxin dose increased to 250 MCG daily. Seen by cardiology in the hospital.  * Demand ischemia Thought to be NSTEMI but s/p Cath she has CAD but no lesions amenable to PCI  Troponin rose to 8  Heparin drip stopped.  * Strep viridans bacteremia - likely from dental source. Repeat blood cultures have been negative. Patient has been on vancomycin and later switched to IV ceftriaxone during the hospital stay. TEE done today showed no endocarditis. Patient will be placed on Levaquin orally for 1 more week as per recommendations from Dr. Ola Spurr  * Depression and anxiety: Patient continue on outpatient medications.  * Constipation - Add colace-Senna and Miralax  Patient is doing well. Is afebrile has normal white blood cell count. Not needing oxygen and her shortness of breath has returned to baseline. No chest pain on ambulation. Stable for discharge back home.   CONSULTS OBTAINED:  Treatment Team:  Corey Skains, MD Adrian Prows, MD Teodoro Spray, MD  DRUG ALLERGIES:   Allergies  Allergen Reactions  . Atorvastatin Other (See Comments)  . Codeine   . Ceftin  [Cefuroxime Axetil] Other (See Comments)    DISCHARGE MEDICATIONS:   Current Discharge Medication List    START taking these medications   Details  alum & mag hydroxide-simeth (MAALOX/MYLANTA) 200-200-20 MG/5ML suspension Take 30 mLs by mouth every 6 (six) hours as needed for indigestion or heartburn (dyspepsia). Qty: 947  mL, Refills: 0    amiodarone (PACERONE) 400 MG tablet Take 1 tablet (400 mg total) by mouth daily. Qty: 30 tablet, Refills: 0    levofloxacin (LEVAQUIN) 500 MG tablet Take 1 tablet (500 mg total) by mouth daily. Qty: 7 tablet, Refills: 0      CONTINUE these medications which have CHANGED   Details  digoxin (LANOXIN) 0.25 MG tablet Take 1 tablet (0.25 mg total) by mouth daily. Qty: 30  tablet, Refills: 0    traMADol (ULTRAM) 50 MG tablet Take 1 tablet (50 mg total) by mouth 3 (three) times daily. Qty: 90 tablet, Refills: 0      CONTINUE these medications which have NOT CHANGED   Details  amitriptyline (ELAVIL) 100 MG tablet Take 100 mg by mouth at bedtime.     apixaban (ELIQUIS) 5 MG TABS tablet Take 5 mg by mouth 2 (two) times daily.     Biotin 2500 MCG CAPS Take 5,000 mcg by mouth daily.     Cholecalciferol (VITAMIN D3) 1000 UNITS CAPS Take 1 capsule by mouth daily.     cyanocobalamin 100 MCG tablet Take 100 mcg by mouth daily.     felodipine (PLENDIL) 10 MG 24 hr tablet Take 10 mg by mouth daily.     fluocinonide cream (LIDEX) 0.86 % Apply 1 application topically 3 (three) times daily. To rash except face Qty: 15 g, Refills: 0   Associated Diagnoses: Contact dermatitis    isosorbide mononitrate (IMDUR) 30 MG 24 hr tablet Take 30 mg by mouth daily.   Associated Diagnoses: MI, acute, non ST segment elevation    levothyroxine (SYNTHROID, LEVOTHROID) 150 MCG tablet Take 150 mcg by mouth daily.     losartan (COZAAR) 100 MG tablet Take 100 mg by mouth daily.     nitroGLYCERIN (NITROSTAT) 0.4 MG SL tablet Place 0.4 mg under the tongue.   Associated Diagnoses: MI, acute, non ST segment elevation    Omega 3-6-9 CAPS Take 1 capsule by mouth daily.     simvastatin (ZOCOR) 40 MG tablet Take 40 mg by mouth every evening.     torsemide (DEMADEX) 20 MG tablet Take 40 mg by mouth daily.     vitamin C (ASCORBIC ACID) 250 MG tablet Take 250 mg by mouth daily.      STOP taking these medications     predniSONE (DELTASONE) 10 MG tablet          Today    VITAL SIGNS:  Blood pressure 130/53, pulse 60, temperature 97.7 F (36.5 C), temperature source Oral, resp. rate 18, height 5\' 5"  (1.651 m), weight 74.163 kg (163 lb 8 oz), SpO2 95 %.  I/O:   Intake/Output Summary (Last 24 hours) at 12/01/14 1412 Last data filed at 12/01/14 1100  Gross per 24 hour   Intake    290 ml  Output   2100 ml  Net  -1810 ml    PHYSICAL EXAMINATION:  Physical Exam  GENERAL:  74 y.o.-year-old patient lying in the bed with no acute distress.  LUNGS: Normal breath sounds bilaterally, no wheezing, rales,rhonchi or crepitation. No use of accessory muscles of respiration.  CARDIOVASCULAR: S1, S2 normal. No murmurs, rubs, or gallops.  ABDOMEN: Soft, non-tender, non-distended. Bowel sounds present. No organomegaly or mass.  NEUROLOGIC: Moves all 4 extremities. PSYCHIATRIC: The patient is alert and oriented x 3.  SKIN: No obvious rash, lesion, or ulcer.   DATA REVIEW:   CBC  Recent Labs Lab 11/27/14 0458  WBC 11.6*  HGB  9.6*  HCT 30.5*  PLT 202    Chemistries   Recent Labs Lab 11/28/14 0506  NA 137  K 3.8  CL 98*  CO2 26  GLUCOSE 145*  BUN 23*  CREATININE 0.91  CALCIUM 9.2  MG 1.9    Cardiac Enzymes No results for input(s): TROPONINI in the last 168 hours.  Microbiology Results  Results for orders placed or performed during the hospital encounter of 11/23/14  MRSA PCR Screening     Status: None   Collection Time: 11/23/14  3:18 PM  Result Value Ref Range Status   MRSA by PCR NEGATIVE NEGATIVE Final    Comment:        The GeneXpert MRSA Assay (FDA approved for NASAL specimens only), is one component of a comprehensive MRSA colonization surveillance program. It is not intended to diagnose MRSA infection nor to guide or monitor treatment for MRSA infections.   Culture, blood (routine x 2)     Status: None   Collection Time: 11/24/14  7:07 AM  Result Value Ref Range Status   Specimen Description BLOOD BOTH 5ML  Final   Special Requests Immunocompromised RIGHT HAND  Final   Culture NO GROWTH 5 DAYS  Final   Report Status 11/29/2014 FINAL  Final  Culture, blood (routine x 2)     Status: None   Collection Time: 11/24/14  7:12 AM  Result Value Ref Range Status   Specimen Description BLOOD BOTH 5ML  Final   Special Requests  Immunocompromised LEFT HAND  Final   Culture  Setup Time   Final    GRAM POSITIVE COCCI ANAEROBIC BOTTLE ONLY CRITICAL RESULT CALLED TO, READ BACK BY AND VERIFIED WITH: IRIS GUIDRY @ 6433 8.9.16 MPG    Culture   Final    VIRIDANS STREPTOCOCCUS SENSITIVITIES PERFORMED PER DR. FITZGERALD    Report Status 11/28/2014 FINAL  Final   Organism ID, Bacteria VIRIDANS STREPTOCOCCUS  Final      Susceptibility   Viridans streptococcus - MIC*    ERYTHROMYCIN Value in next row Sensitive      SENSITIVE0.12    VANCOMYCIN Value in next row Sensitive      SENSITIVE0.5    LEVOFLOXACIN Value in next row Sensitive      SENSITIVE2    CEFTRIAXONE Value in next row Sensitive      SENSITIVE0.12    PENICILLIN Value in next row Sensitive      SENSITIVE0.12    * VIRIDANS STREPTOCOCCUS  Culture, blood (routine x 2)     Status: None   Collection Time: 11/26/14  8:11 AM  Result Value Ref Range Status   Specimen Description BLOOD LEFT WRIST  Final   Special Requests BOTTLES DRAWN AEROBIC AND ANAEROBIC  7 CC  Final   Culture NO GROWTH 5 DAYS  Final   Report Status 12/01/2014 FINAL  Final  Culture, blood (routine x 2)     Status: None   Collection Time: 11/26/14  8:25 AM  Result Value Ref Range Status   Specimen Description BLOOD RIGHT HAND  Final   Special Requests BOTTLES DRAWN AEROBIC AND ANAEROBIC  5 CC  Final   Culture NO GROWTH 5 DAYS  Final   Report Status 12/01/2014 FINAL  Final    RADIOLOGY:  No results found.    Follow up with PCP in 1 week.  Management plans discussed with the patient, family and they are in agreement.  CODE STATUS:     Code Status Orders  Start     Ordered   11/30/14 2346  Full code   Continuous     11/30/14 2345      TOTAL TIME TAKING CARE OF THIS PATIENT ON DAY OF DISCHARGE: more than 30 minutes.    Hillary Bow R M.D on 12/01/2014 at 2:12 PM  Between 7am to 6pm - Pager - [phone]  After 6pm go to [email or web address] - password [email or web address]  [facility]  Office  [phone]  CC: Primary care physician; Wilhemena Durie, MD

## 2014-12-01 NOTE — Telephone Encounter (Signed)
Pt is being discharged today for A Fib.  Pt is scheduled for a hospital follow up 12/08/2014/MW

## 2014-12-01 NOTE — Telephone Encounter (Signed)
Please review. Thanks!  

## 2014-12-01 NOTE — Progress Notes (Addendum)
[place]  SUBJECTIVE: some sob. No chest pain. Puritis last pm.   Filed Vitals:   11/30/14 2103 12/01/14 0449 12/01/14 0500 12/01/14 0736  BP: 140/50 131/49  128/53  Pulse: 58 79  60  Temp: 97.9 F (36.6 C)   98.7 F (37.1 C)  TempSrc: Oral   Oral  Resp: 19 20  14   Height:      Weight:   74.163 kg (163 lb 8 oz)   SpO2: 98% 99%  94%    Intake/Output Summary (Last 24 hours) at 12/01/14 0744 Last data filed at 12/01/14 0546  Gross per 24 hour  Intake     60 ml  Output   1650 ml  Net  -1590 ml    LABS: Basic Metabolic Panel: No results for input(s): NA, K, CL, CO2, GLUCOSE, BUN, CREATININE, CALCIUM, MG, PHOS in the last 72 hours. Liver Function Tests: No results for input(s): AST, ALT, ALKPHOS, BILITOT, PROT, ALBUMIN in the last 72 hours. No results for input(s): LIPASE, AMYLASE in the last 72 hours. CBC: No results for input(s): WBC, NEUTROABS, HGB, HCT, MCV, PLT in the last 72 hours. Cardiac Enzymes: No results for input(s): CKTOTAL, CKMB, CKMBINDEX, TROPONINI in the last 72 hours. BNP: Invalid input(s): POCBNP D-Dimer: No results for input(s): DDIMER in the last 72 hours. Hemoglobin A1C: No results for input(s): HGBA1C in the last 72 hours. Fasting Lipid Panel: No results for input(s): CHOL, HDL, LDLCALC, TRIG, CHOLHDL, LDLDIRECT in the last 72 hours. Thyroid Function Tests: No results for input(s): TSH, T4TOTAL, T3FREE, THYROIDAB in the last 72 hours.  Invalid input(s): FREET3 Anemia Panel: No results for input(s): VITAMINB12, FOLATE, FERRITIN, TIBC, IRON, RETICCTPCT in the last 72 hours.   Physical Exam: Blood pressure 128/53, pulse 60, temperature 98.7 F (37.1 C), temperature source Oral, resp. rate 14, height 5\' 5"  (1.651 m), weight 74.163 kg (163 lb 8 oz), SpO2 94 %.    General appearance: alert and cooperative Resp: clear to auscultation bilaterally Cardio: irregularly irregular rhythm GI: soft, non-tender;  bowel sounds normal; no masses,  no organomegaly Pulses: 2+ and symmetric Neurologic: Grossly normal  TELEMETRY: Reviewed telemetry pt in afib with controlled vr:  ASSESSMENT AND PLAN:  Active Problems:   Atrial fibrillation-rate controlled. Anticoagulated with apixiban  bactaremia-need to rule out sbe. No evidence of vegetation on tte. Will proceed with tee this am.    Teodoro Spray., MD, [facility] 12/01/2014 7:44 AM     Addendum;  TEE completed this am with no immediate complications. Aortic valve was mild to moderately calcified with no vegetations seen. Mild AI. Mitral valve mildly thickened with mild mr and no vegetaions seen. Tricuspid valve appeared normal with trivial to mild tr. No vegetaions. Agitated saline contrast revaled no right to left shunt across intratrial septum.

## 2014-12-01 NOTE — Clinical Social Work Placement (Signed)
   CLINICAL SOCIAL WORK PLACEMENT  NOTE  Date:  12/01/2014  Patient Details  Name: Ruth Hodges MRN: [id number] Date of Birth: 09-17-1940  Clinical Social Work is seeking post-discharge placement for this patient at the [place] level of care (*CSW will initial, date and re-position this form in  chart as items are completed):  Yes   Patient/family provided with [facility] Work Department's list of facilities offering this level of care within the geographic area requested by the patient (or if unable, by the patient's family).  Yes   Patient/family informed of their freedom to choose among providers that offer the needed level of care, that participate in Medicare, Medicaid or managed care program needed by the patient, have an available bed and are willing to accept the patient.  Yes   Patient/family informed of [facility]'s ownership interest in [facility] and [facility], as well as of the fact that they are under no obligation to receive care at these facilities.  PASRR submitted to EDS on 12/01/14     PASRR number received on 12/01/14     Existing PASRR number confirmed on       FL2 transmitted to all facilities in geographic area requested by pt/family on 12/01/14     FL2 transmitted to all facilities within larger geographic area on       Patient informed that his/her managed care company has contracts with or will negotiate with certain facilities, including the following:        Yes   Patient/family informed of bed offers received.  Patient chooses bed at  [facility])     Physician recommends and patient chooses bed at  [facility])    Patient to be transferred to  [facility]) on 12/01/14.  Patient to be transferred to facility by  (car; son in law as reported by patient)     Patient family notified on 12/01/14 of transfer.  Name of family member notified:        PHYSICIAN Please sign FL2     Additional Comment:     _______________________________________________ Shela Leff, LCSW 12/01/2014, 4:04 PM

## 2014-12-01 NOTE — ED Provider Notes (Signed)
[facility] Emergency Department Provider Note     Time seen: ----------------------------------------- 8:28 PM on 12/01/2014 -----------------------------------------    I have reviewed the triage vital signs and the nursing notes.   HISTORY  Chief Complaint Fall and Head Laceration    HPI Ruth Hodges is a 74 y.o. female who presents to ER just after having been discharged from the hospital for dizziness. Patient states she was in the bed since she was discharged and she got up to get something to drink she fell and hit her head. Patient has sustained a laceration to the frontal scalp. Patient states she has been feeling dizzy but mainly has been in bed since discharge. Denies any fevers chills, chest pain, shortness of breath. Patient denies nausea vomiting or diarrhea.   Past Medical History  Diagnosis Date  . Anxiety   . Depression   . A-fib   . Arthritis   . Hypertension   . Hyperlipidemia   . Thyroid disease   . CAD (coronary artery disease)     Patient Active Problem List   Diagnosis Date Noted  . Acute on chronic combined systolic and diastolic heart failure 35/00/9381  . Troponin level elevated 12/01/2014  . Streptococcus viridans infection 12/01/2014  . Atrial fibrillation 11/23/2014  . Contact dermatitis 11/18/2014  . NSTEMI (non-ST elevated myocardial infarction) 10/15/2014  . Unstable angina 10/14/2014  . Allergic rhinitis 09/05/2014  . Arthritis 09/05/2014  . A-fib 09/05/2014  . 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 Arteriosclerosis of coronary artery 09/05/2014  . Anxiety and depression 09/05/2014  . Essential (primary) hypertension 09/05/2014  . Fibrositis 09/05/2014  . H/O acute myocardial infarction 09/05/2014  . DD (diverticular disease) 09/05/2014  . HLD (hyperlipidemia) 09/05/2014  . Adult hypothyroidism 09/05/2014  . 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 Mild cognitive disorder 09/05/2014   . Carotid arterial disease 09/05/2014  . Left ventricular hypertrophy 09/05/2014  . Aortic heart valve narrowing 09/05/2014  . Artificial cardiac pacemaker 07/21/2014    Past Surgical History  Procedure Laterality Date  . Pacemaker insertion    . Cardiac catheterization    . Bladder surgery    . Cholecystectomy    . Appendectomy    . Coronary artery bypass graft    . Vaginal hysterectomy  1972    total, ovaries removed in 1983  . Cardiac catheterization N/A 11/27/2014    Procedure: Left Heart Cath and Cors/Grafts Angiography;  Surgeon: Teodoro Spray, MD;  Location: [place] CV LAB;  Service: Cardiovascular;  Laterality: N/A;    Allergies Atorvastatin; Codeine; and Ceftin   Social History Social History  Substance Use Topics  . Smoking status: Never Smoker   . Smokeless tobacco: Not on file  . Alcohol Use: No    Review of Systems Constitutional: Negative for fever. Eyes: Negative for visual changes. ENT: Negative for sore throat. Cardiovascular: Negative for chest pain. Respiratory: Negative for shortness of breath. Gastrointestinal: Negative for abdominal pain, vomiting and diarrhea. Genitourinary: Negative for dysuria. Musculoskeletal: Negative for back pain. Skin: Negative for rash. Neurological: Positive for headache and head injury, dizziness  10-point ROS otherwise negative.  ____________________________________________   PHYSICAL EXAM:  VITAL SIGNS: ED Triage Vitals  Enc Vitals Group     BP --      Pulse --      Resp --      Temp --      Temp src --      SpO2 --  Weight --      Height --      Head Cir --      Peak Flow --      Pain Score --      Pain Loc --      Pain Edu? --      Excl. in [place]? --     Constitutional: Alert and oriented. Well appearing and in no distress. Eyes: Conjunctivae are normal. PERRL. Normal extraocular movements. ENT   Head: There is a 2.5 cm frontal scalp laceration above the hairline.   Nose: No  congestion/rhinnorhea.   Mouth/Throat: Mucous membranes are moist.   Neck: No stridor. Cardiovascular: Normal rate, regular rhythm. Normal and symmetric distal pulses are present in all extremities. No murmurs, rubs, or gallops. Respiratory: Normal respiratory effort without tachypnea nor retractions. Breath sounds are clear and equal bilaterally. No wheezes/rales/rhonchi. Gastrointestinal: Soft and nontender. No distention. No abdominal bruits.  Musculoskeletal: Nontender with normal range of motion in all extremities. No joint effusions.  No lower extremity tenderness nor edema. Neurologic:  Normal speech and language. No gross focal neurologic deficits are appreciated. Speech is normal. No gait instability. Skin:  Skin is warm, dry and intact. No rash noted. Psychiatric: Mood and affect are normal. Speech and behavior are normal. Patient exhibits appropriate insight and judgment. ____________________________________________  EKG: Interpreted by me. Atrial pacemaker with a rate of 63 bpm, otherwise unremarkable  ____________________________________________  ED COURSE:  Pertinent labs & imaging results that were available during my care of the patient were reviewed by me and considered in my medical decision making (see chart for details).  ____________________________________________    LABS (pertinent positives/negatives)  Labs Reviewed  CBC - Abnormal; Notable for the following:    WBC 12.3 (*)    Hemoglobin 10.0 (*)    HCT 31.4 (*)    MCHC 31.7 (*)    RDW 16.2 (*)    All other components within normal limits  TROPONIN I - Abnormal; Notable for the following:    Troponin I 0.08 (*)    All other components within normal limits  PROTIME-INR - Abnormal; Notable for the following:    Prothrombin Time 17.4 (*)    All other components within normal limits  COMPREHENSIVE METABOLIC PANEL - Abnormal; Notable for the following:    Chloride 99 (*)    Glucose, Bld 115 (*)     BUN 30 (*)    Creatinine, Ser 1.13 (*)    Albumin 3.3 (*)    Alkaline Phosphatase 164 (*)    GFR calc non Af Amer 46 (*)    GFR calc Af Amer 53 (*)    All other components within normal limits    RADIOLOGY Images were viewed by me  Chest x-ray IMPRESSION: Near complete clearing of bilateral infiltrates with some residual lateral left mid lung zone infiltrate. Radiographic follow-up in 4-6 weeks suggested.   IMPRESSION: 1. No acute intracranial findings. 2. Left parietal vertex scalp laceration. 3. 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 4. Dilated perivascular space or remote lacunar infarct along the anterior inferior aspect the left lentiform nucleus. 5. Unerupted or supernumerary tooth along the inferior margin the left maxillary sinus.  ____________________________________________  FINAL ASSESSMENT AND PLAN  Fall, head injury, dizziness, elevated troponin  Plan: Patient with labs and imaging as dictated above. Patient with an elevated troponin, likely trending down from recent market elevation. She is weak and has significant difficulty with ambulation. She will need placement in a rehabilitation  facility. There is no one at home to care for her.   Earleen Newport, MD   Earleen Newport, MD 12/01/14 2228

## 2014-12-01 NOTE — Progress Notes (Signed)
*  PRELIMINARY RESULTS* Echocardiogram Echocardiogram Transesophageal has been performed.  Ruth Hodges 12/01/2014, 8:42 AM

## 2014-12-01 NOTE — Progress Notes (Signed)
Patient right upper arm picc was removed as order intact , pressure dsg apply,no bleeding noted, will continue to monitor

## 2014-12-01 NOTE — Progress Notes (Signed)
TEE negative.  At this point no evidence of endocarditis.  Did have 1/2 blood cultures with viridans strep.    Rec  Can dc on levofloxacin for 5-7 more days I can see in 2 weeks and repeat bcx at that time.

## 2014-12-01 NOTE — ED Notes (Signed)
X-ray at bedside

## 2014-12-01 NOTE — Progress Notes (Signed)
Pt complaining of itching all over & being nauseated, gave pt PO zofran for nuasea, but still complaining of itching. MD paged, MD ordered benadryl 50mg  q6hr PRN, orders put in, will continue to monitor. Conley Simmonds, RN

## 2014-12-01 NOTE — Clinical Social Work Note (Signed)
Patient transferred out of ICU last evening to 2A. Patient has not had a PT order and has stated that she has not gotten out of bed since admission. Physician had discharged patient this morning and was contacted with the above information and has ordered a PT consult for recommendations. Shela Leff MSW,LCSW [phone]

## 2014-12-01 NOTE — Care Management Note (Signed)
Case Management Note  Patient Details  Name: Ruth Hodges MRN: [id number] Date of Birth: 06-13-1940  Subjective/Objective:             [name]'s son discussed a change in discharge planning with Shela Leff CSW late this afternoon per [name] is insisting on returning to her apartment after discharge today. [name] chose [facility] to be her home health provider. This Probation officer called Floydene Flock at [employer] and left a voice message requesting home health PT and RN. Dr Darvin Neighbours updated and ordered home health PT and RN. Abigail RN was asked to give all medication prescriptions to [name] now that she is going to her home after hospital discharge.    Action/Plan:   Expected Discharge Date:                  Expected Discharge Plan:     In-House Referral:     Discharge planning Services  CM Consult  Post Acute Care Choice:  Home Health Choice offered to:  Patient  DME Arranged:    DME Agency:     HH Arranged:    [place] Agency:     Status of Service:  In process, will continue to follow  Medicare Important Message Given:  Yes-third notification given Date Medicare IM Given:    Medicare IM give by:    Date Additional Medicare IM Given:    Additional Medicare Important Message give by:     If discussed at [place] of Stay Meetings, dates discussed:    Additional Comments:  [place] Alfredo A, RN 12/01/2014, 5:06 PM

## 2014-12-01 NOTE — H&P (Signed)
Ruth Hodges is an 74 y.o. female.   Chief Complaint: Fall HPI: Patient presents emergency department after suffering a fall in which she sustained a laceration to the top of her head. The patient denies losing consciousness but admits to feeling dizzy prior to losing her balance. A glass vase fell on her head which she thinks caused the laceration to her scalp. She complains of mild headache as well as thirst. Otherwise the patient has no complaints. She was discharged from the hospital no more than an hour prior to falling and returning to the emergency department. Hospitalization was significant for rule out of endocarditis secondary to Strep. viridans bacteremia. The patient had been on IV antibiotics for 8 days prior to being discharged with oral Levaquin which she has not filled as she had not had a chance to go to the pharmacy prior to readmission.  Past Medical History  Diagnosis Date  . Anxiety   . Depression   . A-fib   . Arthritis   . Hypertension   . Hyperlipidemia   . Thyroid disease   . CAD (coronary artery disease)     Past Surgical History  Procedure Laterality Date  . Pacemaker insertion    . Cardiac catheterization    . Bladder surgery    . Cholecystectomy    . Appendectomy    . Coronary artery bypass graft    . Vaginal hysterectomy  1972    total, ovaries removed in 1983  . Cardiac catheterization N/A 11/27/2014    Procedure: Left Heart Cath and Cors/Grafts Angiography;  Surgeon: Teodoro Spray, MD;  Location: [facility];  Service: Cardiovascular;  Laterality: N/A;    Family History  Problem Relation Age of Onset  . Heart attack Father   . CVA Sister   . Diabetes Brother   . Heart attack Brother    Social History:  reports that she has never smoked. She does not have any smokeless tobacco history on file. She reports that she does not drink alcohol or use illicit drugs.  Allergies:  Allergies  Allergen Reactions  . Codeine Other (See Comments)    Hallucination  . Ceftin  [Cefuroxime Axetil] Other (See Comments)    Prior to Admission medications   Medication Sig Start Date End Date Taking? Authorizing Provider  alum & mag hydroxide-simeth (MAALOX/MYLANTA) 200-200-20 MG/5ML suspension Take 30 mLs by mouth every 6 (six) hours as needed for indigestion or heartburn (dyspepsia). 11/27/14  Yes Teodoro Spray, MD  amiodarone (PACERONE) 400 MG tablet Take 1 tablet (400 mg total) by mouth daily. 12/01/14  Yes Srikar Sudini, MD  amitriptyline (ELAVIL) 100 MG tablet Take 100 mg by mouth at bedtime.  08/31/14  Yes Historical Provider, MD  apixaban (ELIQUIS) 5 MG TABS tablet Take 5 mg by mouth 2 (two) times daily.  08/31/14  Yes Historical Provider, MD  Biotin 2500 MCG CAPS Take 5,000 mcg by mouth daily.  08/31/14  Yes Historical Provider, MD  Cholecalciferol (VITAMIN D3) 1000 UNITS CAPS Take 1 capsule by mouth daily.  08/31/14  Yes Historical Provider, MD  cyanocobalamin 100 MCG tablet Take 100 mcg by mouth daily.  08/31/14  Yes Historical Provider, MD  digoxin (LANOXIN) 0.25 MG tablet Take 1 tablet (0.25 mg total) by mouth daily. 12/01/14  Yes Srikar Sudini, MD  felodipine (PLENDIL) 10 MG 24 hr tablet Take 10 mg by mouth daily.  08/31/14  Yes Historical Provider, MD  fluocinonide cream (LIDEX) 6.57 % Apply 1 application topically  3 (three) times daily. To rash except face 11/18/14  Yes Carmon Ginsberg, PA  isosorbide mononitrate (IMDUR) 30 MG 24 hr tablet Take 30 mg by mouth daily. 10/27/14 10/27/15 Yes Historical Provider, MD  levofloxacin (LEVAQUIN) 500 MG tablet Take 1 tablet (500 mg total) by mouth daily. 12/01/14  Yes Srikar Sudini, MD  levothyroxine (SYNTHROID, LEVOTHROID) 150 MCG tablet Take 150 mcg by mouth daily.  04/29/14  Yes Historical Provider, MD  losartan (COZAAR) 100 MG tablet Take 100 mg by mouth daily.  04/29/14  Yes Historical Provider, MD  nitroGLYCERIN (NITROSTAT) 0.4 MG SL tablet Place 0.4 mg under the tongue. 10/27/14 10/27/15 Yes Historical  Provider, MD  Omega 3-6-9 CAPS Take 1 capsule by mouth daily.  08/31/14  Yes Historical Provider, MD  simvastatin (ZOCOR) 40 MG tablet Take 40 mg by mouth every evening.  08/31/14  Yes Historical Provider, MD  torsemide (DEMADEX) 20 MG tablet Take 40 mg by mouth daily.  08/31/14  Yes Historical Provider, MD  traMADol (ULTRAM) 50 MG tablet Take 1 tablet (50 mg total) by mouth 3 (three) times daily. 12/01/14  Yes Srikar Sudini, MD  vitamin C (ASCORBIC ACID) 250 MG tablet Take 250 mg by mouth daily.   Yes Historical Provider, MD     Results for orders placed or performed during the hospital encounter of 12/01/14 (from the past 48 hour(s))  CBC     Status: Abnormal   Collection Time: 12/01/14  8:34 PM  Result Value Ref Range   WBC 12.3 (H) 3.6 - 11.0 K/uL   RBC 3.83 3.80 - 5.20 MIL/uL   Hemoglobin 10.0 (L) 12.0 - 16.0 g/dL   HCT 31.4 (L) 35.0 - 47.0 %   MCV 82.0 80.0 - 100.0 fL   MCH 26.0 26.0 - 34.0 pg   MCHC 31.7 (L) 32.0 - 36.0 g/dL   RDW 16.2 (H) 11.5 - 14.5 %   Platelets 282 150 - 440 K/uL  Troponin I     Status: Abnormal   Collection Time: 12/01/14  8:34 PM  Result Value Ref Range   Troponin I 0.08 (H) <0.031 ng/mL    Comment: READ BACK AND VERIFIED ASHLEY ROSS 12/01/2014 [address]        PERSISTENTLY INCREASED TROPONIN VALUES IN THE RANGE OF 0.04-0.49 ng/mL CAN BE SEEN IN:       -UNSTABLE ANGINA       -CONGESTIVE HEART FAILURE       -MYOCARDITIS       -CHEST TRAUMA       -ARRYHTHMIAS       -LATE PRESENTING MYOCARDIAL INFARCTION       -COPD   CLINICAL FOLLOW-UP RECOMMENDED.   Protime-INR     Status: Abnormal   Collection Time: 12/01/14  8:34 PM  Result Value Ref Range   Prothrombin Time 17.4 (H) 11.4 - 15.0 seconds   INR 1.40   Comprehensive metabolic panel     Status: Abnormal   Collection Time: 12/01/14  8:34 PM  Result Value Ref Range   Sodium 135 135 - 145 mmol/L   Potassium 4.5 3.5 - 5.1 mmol/L   Chloride 99 (L) 101 - 111 mmol/L   CO2 25 22 - 32 mmol/L   Glucose,  Bld 115 (H) 65 - 99 mg/dL   BUN 30 (H) 6 - 20 mg/dL   Creatinine, Ser 1.13 (H) 0.44 - 1.00 mg/dL   Calcium 9.2 8.9 - 10.3 mg/dL   Total Protein 7.5 6.5 - 8.1 g/dL  Albumin 3.3 (L) 3.5 - 5.0 g/dL   AST 39 15 - 41 U/L   ALT 28 14 - 54 U/L   Alkaline Phosphatase 164 (H) 38 - 126 U/L   Total Bilirubin 0.4 0.3 - 1.2 mg/dL   GFR calc non Af Amer 46 (L) >60 mL/min   GFR calc Af Amer 53 (L) >60 mL/min    Comment: (NOTE) The eGFR has been calculated using the CKD EPI equation. This calculation has not been validated in all clinical situations. eGFR's persistently <60 mL/min signify possible Chronic Kidney Disease.    Anion gap 11 5 - 15  Urinalysis complete, with microscopic ([facility] only)     Status: Abnormal   Collection Time: 12/01/14 10:55 PM  Result Value Ref Range   Color, Urine YELLOW (A) YELLOW   APPearance CLOUDY (A) CLEAR   Glucose, UA NEGATIVE NEGATIVE mg/dL   Bilirubin Urine NEGATIVE NEGATIVE   Ketones, ur NEGATIVE NEGATIVE mg/dL   Specific Gravity, Urine 1.016 1.005 - 1.030   Hgb urine dipstick NEGATIVE NEGATIVE   pH 5.0 5.0 - 8.0   Protein, ur NEGATIVE NEGATIVE mg/dL   Nitrite NEGATIVE NEGATIVE   Leukocytes, UA NEGATIVE NEGATIVE   RBC / HPF NONE SEEN 0 - 5 RBC/hpf   WBC, UA 0-5 0 - 5 WBC/hpf   Bacteria, UA RARE (A) NONE SEEN   Squamous Epithelial / LPF 0-5 (A) NONE SEEN   Uric Acid Crys, UA PRESENT    Ct Head Wo Contrast  12/01/2014   CLINICAL DATA:  Laceration along the vertex after a fall.  EXAM: CT HEAD WITHOUT CONTRAST  TECHNIQUE: Contiguous axial images were obtained from the base of the skull through the vertex without intravenous contrast.  COMPARISON:  None.  FINDINGS: 1 cm dilated perivascular space or lacunar infarct along the inferior margin of the left lentiform nucleus.  Periventricular white matter and corona radiata hypodensities favor chronic ischemic microvascular white matter disease. Despite efforts by the technologist and patient, motion artifact is  present on today's exam and could not be eliminated. This reduces exam sensitivity and specificity.  Scalp laceration along the left parietal vertex. A supernumerary or unerupted tooth is present along the inferior margin of the left maxillary sinus.  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  Hyperostosis frontalis interna.  IMPRESSION: 1. No acute intracranial findings. 2. Left parietal vertex scalp laceration. 3. 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 4. Dilated perivascular space or remote lacunar infarct along the anterior inferior aspect the left lentiform nucleus. 5. Unerupted or supernumerary tooth along the inferior margin the left maxillary sinus.   Electronically Signed   By: Van Clines M.D.   On: 12/01/2014 21:37   Dg Chest Port 1 View  12/01/2014   CLINICAL DATA:  Dizziness and laceration to top of head after fall  EXAM: PORTABLE CHEST - 1 VIEW  COMPARISON:  11/25/2014  FINDINGS: Mild cardiac enlargement stable. Two lead cardiac pacer unchanged. Vascular pattern is normal. Right lung is clear. Mild residual infiltrate laterally in the lingula with significantly improved aeration on the left.  IMPRESSION: Near complete clearing of bilateral infiltrates with some residual lateral left mid lung zone infiltrate. Radiographic follow-up in 4-6 weeks suggested.   Electronically Signed   By: Skipper Cliche M.D.   On: 12/01/2014 21:28    Review of Systems  Constitutional: Negative for fever and chills.  HENT: Negative for sore throat and tinnitus.   Eyes: Negative for blurred vision and redness.  Respiratory: Negative  for cough and shortness of breath.   Cardiovascular: Negative for chest pain, palpitations, orthopnea and PND.  Gastrointestinal: Negative for nausea, vomiting, abdominal pain and diarrhea.  Genitourinary: Negative for dysuria, urgency and frequency.  Musculoskeletal: Positive for falls. Negative for myalgias and joint pain.  Skin: Negative for rash.       No  lesions  Neurological: Negative for speech change, focal weakness and weakness.  Endo/Heme/Allergies: Does not bruise/bleed easily.       No temperature intolerance  Psychiatric/Behavioral: Negative for depression and suicidal ideas.    Blood pressure 140/48, pulse 60, temperature 98.1 F (36.7 C), temperature source Oral, resp. rate 12, height _0  (1.626 m), weight 73.936 kg (163 lb), SpO2 96 %. Physical Exam  Vitals reviewed. Constitutional: She is oriented to person, place, and time. She appears well-developed and well-nourished.  HENT:  Head: Normocephalic and atraumatic.  Mouth/Throat: Oropharynx is clear and moist.  Eyes: Conjunctivae and EOM are normal. Pupils are equal, round, and reactive to light. No scleral icterus.  Neck: Normal range of motion. Neck supple. No JVD present. No tracheal deviation present. No thyromegaly present.  Cardiovascular: Normal rate and regular rhythm.  Exam reveals no gallop and no friction rub.   Murmur heard.  Systolic murmur is present with a grade of 3/6  Heard best over aortic valve area  Respiratory: Effort normal and breath sounds normal.  GI: Soft. Bowel sounds are normal. She exhibits no distension. There is no tenderness.  Genitourinary:  Deferred  Musculoskeletal: Normal range of motion. She exhibits no edema.  Lymphadenopathy:    She has no cervical adenopathy.  Neurological: She is alert and oriented to person, place, and time. No cranial nerve deficit. She exhibits normal muscle tone.  Skin: Skin is warm and dry. No rash noted. No erythema.  Psychiatric: She has a normal mood and affect. Her behavior is normal. Judgment and thought content normal.     Assessment/Plan This 74 year old Caucasian female admitted for acute kidney injury, closed head injury as well as desaturations on room air. 1. Acute kidney injury: Possible secondary to dehydration. The patient admits to feeling excessively thirsty prior to symptoms of dizziness  which led to her fall. We'll hydrate the patient with intravenous fluid and avoid nephrotoxic drugs.  2. Head injury: Laceration to scalp closed by primary intent staples. CT had is without acute intracranial abnormality. Observe for concussive symptoms. 3. Oxygen desaturation: Possibly contributes to dizziness. Supply supplemental oxygen as needed. 4. Leukocytosis: Mild; possibly due to pneumonia which is resolving at this time and/or mild urinary tract infection as patient has an indwelling catheter at this time due to urinary retention. We will remove catheter and continue oral antibiotics. 5. A. fib: Rate controlled continue apixaban and amiodarone 6. Congestive heart failure: Stable, continue digoxin and torsemide 7. Hypertension: Continue felodipine and losartan 8. Coronary artery disease: Stable; continue Imdur 9. Hypothyroidism: Continue Synthroid 10. Bacteremia: No signs or symptoms of sepsis at this time. No vegetations on TEE. May continue oral antibiotics per infectious disease recommendations. Continue Levaquin 11. DVT prophylaxis: As above 13. GI prophylaxis: None The patient is a full code. Time spent on admission orders and patient care approximately 35 minutes  Harrie Foreman 12/01/2014, 11:45 PM

## 2014-12-02 ENCOUNTER — Encounter: Payer: Self-pay | Admitting: Emergency Medicine

## 2014-12-02 MED ORDER — VITAMIN C 500 MG PO TABS
250.0000 mg | ORAL_TABLET | Freq: Every day | ORAL | Status: DC
Start: 2014-12-02 — End: 2014-12-02
  Administered 2014-12-02: 250 mg via ORAL
  Filled 2014-12-02: qty 1

## 2014-12-02 MED ORDER — ISOSORBIDE MONONITRATE ER 30 MG PO TB24
30.0000 mg | ORAL_TABLET | Freq: Every day | ORAL | Status: DC
  Administered 2014-12-02: 30 mg via ORAL
  Filled 2014-12-02: qty 1

## 2014-12-02 MED ORDER — OMEGA 3-6-9 PO CAPS: 1.0000 | ORAL_CAPSULE | Freq: Every day | ORAL | Status: DC

## 2014-12-02 MED ORDER — ZOLPIDEM TARTRATE 5 MG PO TABS
5.0000 mg | ORAL_TABLET | Freq: Every evening | ORAL | Status: DC | PRN
  Administered 2014-12-02: 5 mg via ORAL
  Filled 2014-12-02: qty 1

## 2014-12-02 MED ORDER — LEVOFLOXACIN 250 MG PO TABS
250.0000 mg | ORAL_TABLET | Freq: Every day | ORAL | Status: DC
  Administered 2014-12-02: 250 mg via ORAL
  Filled 2014-12-02: qty 1

## 2014-12-02 MED ORDER — SODIUM CHLORIDE 0.9 % IJ SOLN
3.0000 mL | Freq: Two times a day (BID) | INTRAMUSCULAR | Status: DC
  Administered 2014-12-02: 3 mL via INTRAVENOUS

## 2014-12-02 MED ORDER — DIGOXIN 125 MCG PO TABS
0.1250 mg | ORAL_TABLET | Freq: Every day | ORAL | Status: DC
  Administered 2014-12-02: 0.125 mg via ORAL
  Filled 2014-12-02: qty 1

## 2014-12-02 MED ORDER — SODIUM CHLORIDE 0.9 % IV SOLN
INTRAVENOUS | Status: DC
  Administered 2014-12-02: 02:00:00 via INTRAVENOUS

## 2014-12-02 MED ORDER — ONDANSETRON HCL 4 MG/2ML IJ SOLN: 4.0000 mg | Freq: Four times a day (QID) | INTRAMUSCULAR | Status: DC | PRN

## 2014-12-02 MED ORDER — MECLIZINE HCL 12.5 MG PO TABS: 12.5000 mg | ORAL_TABLET | Freq: Two times a day (BID) | ORAL | Status: AC | PRN

## 2014-12-02 MED ORDER — VITAMIN D3 25 MCG (1000 UNIT) PO TABS
1000.0000 [IU] | ORAL_TABLET | Freq: Every day | ORAL | Status: DC
  Administered 2014-12-02: 1000 [IU] via ORAL
  Filled 2014-12-02 (×2): qty 1

## 2014-12-02 MED ORDER — APIXABAN 5 MG PO TABS
5.0000 mg | ORAL_TABLET | Freq: Two times a day (BID) | ORAL | Status: DC
  Administered 2014-12-02: 5 mg via ORAL
  Filled 2014-12-02: qty 1

## 2014-12-02 MED ORDER — TORSEMIDE 20 MG PO TABS: 20.0000 mg | ORAL_TABLET | Freq: Every day | ORAL | Status: AC

## 2014-12-02 MED ORDER — AMITRIPTYLINE HCL 25 MG PO TABS
100.0000 mg | ORAL_TABLET | Freq: Every day | ORAL | Status: DC
Start: 2014-12-02 — End: 2014-12-02

## 2014-12-02 MED ORDER — TRAMADOL HCL 50 MG PO TABS
50.0000 mg | ORAL_TABLET | Freq: Three times a day (TID) | ORAL | Status: DC
  Administered 2014-12-02: 50 mg via ORAL
  Filled 2014-12-02: qty 1

## 2014-12-02 MED ORDER — ALUM & MAG HYDROXIDE-SIMETH 200-200-20 MG/5ML PO SUSP: 30.0000 mL | Freq: Four times a day (QID) | ORAL | Status: DC | PRN

## 2014-12-02 MED ORDER — LEVOFLOXACIN 250 MG PO TABS: 250.0000 mg | ORAL_TABLET | Freq: Every day | ORAL | Status: AC

## 2014-12-02 MED ORDER — ACETAMINOPHEN 325 MG PO TABS: 650.0000 mg | ORAL_TABLET | Freq: Four times a day (QID) | ORAL | Status: DC | PRN

## 2014-12-02 MED ORDER — DIGOXIN 125 MCG PO TABS: 0.1250 mg | ORAL_TABLET | Freq: Every day | ORAL | Status: AC

## 2014-12-02 MED ORDER — TRAMADOL HCL 50 MG PO TABS: 50.0000 mg | ORAL_TABLET | Freq: Three times a day (TID) | ORAL | Status: DC | PRN

## 2014-12-02 MED ORDER — TORSEMIDE 20 MG PO TABS
40.0000 mg | ORAL_TABLET | Freq: Every day | ORAL | Status: DC
  Administered 2014-12-02: 40 mg via ORAL
  Filled 2014-12-02: qty 2

## 2014-12-02 MED ORDER — AMIODARONE HCL 200 MG PO TABS
400.0000 mg | ORAL_TABLET | Freq: Every day | ORAL | Status: DC
  Administered 2014-12-02: 400 mg via ORAL
  Filled 2014-12-02: qty 2

## 2014-12-02 MED ORDER — BIOTIN 2500 MCG PO CAPS: 5000.0000 ug | ORAL_CAPSULE | Freq: Every day | ORAL | Status: DC

## 2014-12-02 MED ORDER — LOSARTAN POTASSIUM 50 MG PO TABS
100.0000 mg | ORAL_TABLET | Freq: Every day | ORAL | Status: DC
  Administered 2014-12-02: 100 mg via ORAL
  Filled 2014-12-02: qty 2

## 2014-12-02 MED ORDER — NITROGLYCERIN 0.4 MG SL SUBL: 0.4000 mg | SUBLINGUAL_TABLET | SUBLINGUAL | Status: DC | PRN

## 2014-12-02 MED ORDER — ALPRAZOLAM 0.25 MG PO TABS
0.2500 mg | ORAL_TABLET | ORAL | Status: AC
  Administered 2014-12-02: 0.25 mg via ORAL
  Filled 2014-12-02: qty 1

## 2014-12-02 MED ORDER — ONDANSETRON HCL 4 MG PO TABS: 4.0000 mg | ORAL_TABLET | Freq: Four times a day (QID) | ORAL | Status: DC | PRN

## 2014-12-02 MED ORDER — FELODIPINE ER 10 MG PO TB24
10.0000 mg | ORAL_TABLET | Freq: Every day | ORAL | Status: DC
  Administered 2014-12-02: 10 mg via ORAL
  Filled 2014-12-02: qty 1

## 2014-12-02 MED ORDER — SIMVASTATIN 40 MG PO TABS: 40.0000 mg | ORAL_TABLET | Freq: Every evening | ORAL | Status: DC

## 2014-12-02 MED ORDER — VITAMIN B-12 100 MCG PO TABS
100.0000 ug | ORAL_TABLET | Freq: Every day | ORAL | Status: DC
  Administered 2014-12-02: 100 ug via ORAL
  Filled 2014-12-02: qty 1

## 2014-12-02 MED ORDER — LEVOTHYROXINE SODIUM 75 MCG PO TABS
150.0000 ug | ORAL_TABLET | Freq: Every day | ORAL | Status: DC
  Administered 2014-12-02: 150 ug via ORAL
  Filled 2014-12-02: qty 2

## 2014-12-02 MED ORDER — DIPHENHYDRAMINE HCL 25 MG PO CAPS: 25.0000 mg | ORAL_CAPSULE | Freq: Every evening | ORAL | Status: DC | PRN

## 2014-12-02 MED ORDER — ACETAMINOPHEN 650 MG RE SUPP: 650.0000 mg | Freq: Four times a day (QID) | RECTAL | Status: DC | PRN

## 2014-12-02 NOTE — Progress Notes (Signed)
Patient discharged via car by son in law. Nursing staff assisted patient into the car.

## 2014-12-02 NOTE — Telephone Encounter (Signed)
Please call for transition care call.

## 2014-12-02 NOTE — Progress Notes (Signed)
Called MD to clarify foley order. Pt had concerns about discontinuation due to retention last admission. Acknowledged. MD gave permission for foley to remain in. Pt asked for medication to aide with sleeping. MD aware. Acknowledged. New orders placed.

## 2014-12-02 NOTE — Telephone Encounter (Signed)
LMTCB ED 

## 2014-12-02 NOTE — Progress Notes (Deleted)
Patient discharged to [facility] by son in law in car. Staff escorted patient out and patient was able to get in the car with assistance.

## 2014-12-02 NOTE — Progress Notes (Signed)
Discharge paperwork given to son in law to take to [facility] manner. Social work Therapist, sports. AVS also given to son in law for patient's own reference. Education given on syncope and foley care. Patient will discharge per MD with foley and keep it for another five days or so at the facility. Patient still has leg bag attached from discharge yesterday. Emptied prior to transport. IV and tele discontinued. Report called to [facility] manner RN, Everlean Alstrom.

## 2014-12-02 NOTE — Discharge Summary (Signed)
[facility] [place] NAME: Ruth Hodges    MR#:  [id number]  DATE OF BIRTH:  December 10, 1940  DATE OF ADMISSION:  12/01/2014 ADMITTING PHYSICIAN: Ruth Foreman, MD  DATE OF DISCHARGE: 12/02/2014  PRIMARY CARE PHYSICIAN: Ruth Durie, MD    ADMISSION DIAGNOSIS:  Dizziness [R42] Hypoxia [R09.02] Elevated troponin [R79.89]  DISCHARGE DIAGNOSIS:  Active Problems:   Oxygen desaturation   SECONDARY DIAGNOSIS:   Past Medical History  Diagnosis Date  . Anxiety   . Depression   . A-fib   . Arthritis   . Hypertension   . Hyperlipidemia   . Thyroid disease   . CAD (coronary artery disease)     HOSPITAL COURSE:   [name] is a 74 year old Caucasian female with past medical history significant for coronary artery disease status post bypass graft surgery, atrial fibrillation with sick sinus syndrome status post pacemaker, systolic congestive heart failure, hypertension, hypothyroidism who was recently in the hospital from 11/23/2014 to /12/01/2014 for A. fib RVR and CHF exacerbation was discharged to home though she was advised to go to rehabilitation due to patient refusal. Patient went home stood up to go to the kitchen and felt dizzy had a fall and had to be brought back to the hospital.  #1 fall-due to weakness and dizziness. Unsteady gait. -Seen by physical therapy yesterday prior to discharge who have recommended rehabilitation for patient and she refused to go. -No orthostatic changes. CT of the head showing only laceration to the vertex of the head that was stapled. -No loss of consciousness. No hypoxia noted here. -Patient will be discharged to rehabilitation today.  #2 acute urinary retention-during her last admission, she had acute urinary retention and was discharged with Foley catheter. We'll continue Foley catheter for another 5 days and then discontinue the catheter for voiding trial. If she still  experiences difficulty urination will need to follow up with urology. -Avoid any anticholinergics if not needed.  #3 atrial fibrillation-known history of chronic A. fib and is status post pacemaker -Heart rate is well controlled this admission. -Was started on amiodarone 400 mg last week and will continue that and in one week then needs to be decreased to 200 mg daily. She will follow up with cardiology for this same. -Not on any beta blockers at this time. -Her digoxin was increased during her last admission but now decreased back to 0.125 mg due to worsening renal function. Continue to monitor.  #4 chronic combined heart failure-was an exacerbation last week, now well compensated. -Continue low-sodium diet and fluid restriction. -Due to slightly worsening BUN and creatinine will decrease back her torsemide to 20 mg daily. Follow up with cardiology as outpatient.  #5 recent strep viridans bacteremia-likely from dental source. Seen by Ruth Hodges last week for this same. -Antibiotics have been adjusted to oral Levaquin. Dose adjusted to renal, continue for another 6 days.  #6 NSTEMI-during recent admission last week, elevated troponin. Patient already status post CABG. -She had cardiac catheter done that showed diffuse coronary artery disease but there were no lesions amenable to PCI. -Denies any chest pain at this time. Continue medical management and follow-up with cardiology.  #7 depression and anxiety-continue outpatient medications.  DISCHARGE CONDITIONS:   Stable  CONSULTS OBTAINED:  Treatment Team:  Ruth Lighter, MD  DRUG ALLERGIES:   Allergies  Allergen Reactions  . Codeine Other (See Comments)    Hallucination  . Ceftin  [Cefuroxime Axetil] Other (See  Comments)    DISCHARGE MEDICATIONS:   Current Discharge Medication List    START taking these medications   Details  meclizine (ANTIVERT) 12.5 MG tablet Take 1 tablet (12.5 mg total) by mouth 2 (two) times  daily as needed for dizziness. Qty: 30 tablet, Refills: 0      CONTINUE these medications which have CHANGED   Details  digoxin (LANOXIN) 0.125 MG tablet Take 1 tablet (0.125 mg total) by mouth daily. Qty: 30 tablet, Refills: 0    levofloxacin (LEVAQUIN) 250 MG tablet Take 1 tablet (250 mg total) by mouth daily. Qty: 6 tablet, Refills: 0    torsemide (DEMADEX) 20 MG tablet Take 1 tablet (20 mg total) by mouth daily. Qty: 30 tablet, Refills: 0    traMADol (ULTRAM) 50 MG tablet Take 1 tablet (50 mg total) by mouth 3 (three) times daily as needed. Qty: 30 tablet, Refills: 0      CONTINUE these medications which have NOT CHANGED   Details  alum & mag hydroxide-simeth (MAALOX/MYLANTA) 200-200-20 MG/5ML suspension Take 30 mLs by mouth every 6 (six) hours as needed for indigestion or heartburn (dyspepsia). Qty: 355 mL, Refills: 0    amiodarone (PACERONE) 400 MG tablet Take 1 tablet (400 mg total) by mouth daily. Qty: 30 tablet, Refills: 0    amitriptyline (ELAVIL) 100 MG tablet Take 100 mg by mouth at bedtime.     apixaban (ELIQUIS) 5 MG TABS tablet Take 5 mg by mouth 2 (two) times daily.     Biotin 2500 MCG CAPS Take 5,000 mcg by mouth daily.     Cholecalciferol (VITAMIN D3) 1000 UNITS CAPS Take 1 capsule by mouth daily.     cyanocobalamin 100 MCG tablet Take 100 mcg by mouth daily.     felodipine (PLENDIL) 10 MG 24 hr tablet Take 10 mg by mouth daily.     fluocinonide cream (LIDEX) 0.10 % Apply 1 application topically 3 (three) times daily. To rash except face Qty: 15 g, Refills: 0   Associated Diagnoses: Contact dermatitis    isosorbide mononitrate (IMDUR) 30 MG 24 hr tablet Take 30 mg by mouth daily.   Associated Diagnoses: MI, acute, non ST segment elevation    levothyroxine (SYNTHROID, LEVOTHROID) 150 MCG tablet Take 150 mcg by mouth daily.     losartan (COZAAR) 100 MG tablet Take 100 mg by mouth daily.     nitroGLYCERIN (NITROSTAT) 0.4 MG SL tablet Place 0.4 mg under  the tongue.   Associated Diagnoses: MI, acute, non ST segment elevation    Omega 3-6-9 CAPS Take 1 capsule by mouth daily.     simvastatin (ZOCOR) 40 MG tablet Take 40 mg by mouth every evening.     vitamin C (ASCORBIC ACID) 250 MG tablet Take 250 mg by mouth daily.         DISCHARGE INSTRUCTIONS:   1. PCP follow-up in 1 week 2. Cardiology follow with the [facility] cardiology in 1 week 3. Remove Foley catheter in 5 days and to a voiding trial.  If you experience worsening of your admission symptoms, develop shortness of breath, life threatening emergency, suicidal or homicidal thoughts you must seek medical attention immediately by calling 911 or calling your MD immediately  if symptoms less severe.  You Must read complete instructions/literature along with all the possible adverse reactions/side effects for all the Medicines you take and that have been prescribed to you. Take any new Medicines after you have completely understood and accept all the possible adverse reactions/side  effects.   Please note  You were cared for by a hospitalist during your hospital stay. If you have any questions about your discharge medications or the care you received while you were in the hospital after you are discharged, you can call the unit and asked to speak with the hospitalist on call if the hospitalist that took care of you is not available. Once you are discharged, your primary care physician will handle any further medical issues. Please note that NO REFILLS for any discharge medications will be authorized once you are discharged, as it is imperative that you return to your primary care physician (or establish a relationship with a primary care physician if you do not have one) for your aftercare needs so that they can reassess your need for medications and monitor your lab values.    Today   CHIEF COMPLAINT:   Chief Complaint  Patient presents with  . Fall  . Head Laceration    VITAL SIGNS:   Blood pressure 125/52, pulse 60, temperature 97.9 F (36.6 C), temperature source Oral, resp. rate 20, height 5\' 4"  (1.626 m), weight 71.578 kg (157 lb 12.8 oz), SpO2 92 %.  I/O:   Intake/Output Summary (Last 24 hours) at 12/02/14 1006 Last data filed at 12/02/14 0937  Gross per 24 hour  Intake    240 ml  Output   1350 ml  Net  -1110 ml    PHYSICAL EXAMINATION:   Physical Exam  GENERAL:  74 y.o.-year-old patient lying in the bed with no acute distress.  EYES: Pupils equal, round, reactive to light and accommodation. No scleral icterus. Extraocular muscles intact.  HEENT: Head traumatic- there's a laceration on the vertex of the head with staples in place. No active bleeding at this time., normocephalic. Oropharynx and nasopharynx clear.  NECK:  Supple, no jugular venous distention. No thyroid enlargement, no tenderness.  LUNGS: Normal breath sounds bilaterally, no wheezing, rales,rhonchi or crepitation. No use of accessory muscles of respiration. Decreased bibasilar breath sounds. CARDIOVASCULAR: S1, S2 normal. 3/6 systolic murmurs present, no rubs, or gallops.  ABDOMEN: Soft, non-tender, non-distended. Bowel sounds present. No organomegaly or mass.  EXTREMITIES: No pedal edema, cyanosis, or clubbing.  NEUROLOGIC: Cranial nerves II through XII are intact. Muscle strength 5/5 in all extremities. Sensation intact. Gait not checked.  PSYCHIATRIC: The patient is alert and oriented x 3.  SKIN: No obvious rash, lesion, or ulcer.   DATA REVIEW:   CBC  Recent Labs Lab 12/01/14 2034  WBC 12.3*  HGB 10.0*  HCT 31.4*  PLT 282    Chemistries   Recent Labs Lab 11/28/14 0506 12/01/14 2034  NA 137 135  K 3.8 4.5  CL 98* 99*  CO2 26 25  GLUCOSE 145* 115*  BUN 23* 30*  CREATININE 0.91 1.13*  CALCIUM 9.2 9.2  MG 1.9  --   AST  --  39  ALT  --  28  ALKPHOS  --  164*  BILITOT  --  0.4    Cardiac Enzymes  Recent Labs Lab 12/01/14 2034  TROPONINI 0.08*     Microbiology Results  Results for orders placed or performed during the hospital encounter of 11/23/14  MRSA PCR Screening     Status: None   Collection Time: 11/23/14  3:18 PM  Result Value Ref Range Status   MRSA by PCR NEGATIVE NEGATIVE Final    Comment:        The GeneXpert MRSA Assay (FDA approved for NASAL specimens  only), is one component of a comprehensive MRSA colonization surveillance program. It is not intended to diagnose MRSA infection nor to guide or monitor treatment for MRSA infections.   Culture, blood (routine x 2)     Status: None   Collection Time: 11/24/14  7:07 AM  Result Value Ref Range Status   Specimen Description BLOOD BOTH 5ML  Final   Special Requests Immunocompromised RIGHT HAND  Final   Culture NO GROWTH 5 DAYS  Final   Report Status 11/29/2014 FINAL  Final  Culture, blood (routine x 2)     Status: None   Collection Time: 11/24/14  7:12 AM  Result Value Ref Range Status   Specimen Description BLOOD BOTH 5ML  Final   Special Requests Immunocompromised LEFT HAND  Final   Culture  Setup Time   Final    GRAM POSITIVE COCCI ANAEROBIC BOTTLE ONLY CRITICAL RESULT CALLED TO, READ BACK BY AND VERIFIED WITH: IRIS GUIDRY @ 9735 8.9.16 MPG    Culture   Final    VIRIDANS STREPTOCOCCUS SENSITIVITIES PERFORMED PER DR. FITZGERALD    Report Status 11/28/2014 FINAL  Final   Organism ID, Bacteria VIRIDANS STREPTOCOCCUS  Final      Susceptibility   Viridans streptococcus - MIC*    ERYTHROMYCIN Value in next row Sensitive      SENSITIVE0.12    VANCOMYCIN Value in next row Sensitive      SENSITIVE0.5    LEVOFLOXACIN Value in next row Sensitive      SENSITIVE2    CEFTRIAXONE Value in next row Sensitive      SENSITIVE0.12    PENICILLIN Value in next row Sensitive      SENSITIVE0.12    * VIRIDANS STREPTOCOCCUS  Culture, blood (routine x 2)     Status: None   Collection Time: 11/26/14  8:11 AM  Result Value Ref Range Status   Specimen Description  BLOOD LEFT WRIST  Final   Special Requests BOTTLES DRAWN AEROBIC AND ANAEROBIC  7 CC  Final   Culture NO GROWTH 5 DAYS  Final   Report Status 12/01/2014 FINAL  Final  Culture, blood (routine x 2)     Status: None   Collection Time: 11/26/14  8:25 AM  Result Value Ref Range Status   Specimen Description BLOOD RIGHT HAND  Final   Special Requests BOTTLES DRAWN AEROBIC AND ANAEROBIC  5 CC  Final   Culture NO GROWTH 5 DAYS  Final   Report Status 12/01/2014 FINAL  Final    RADIOLOGY:  Ct Head Wo Contrast  12/01/2014   CLINICAL DATA:  Laceration along the vertex after a fall.  EXAM: CT HEAD WITHOUT CONTRAST  TECHNIQUE: Contiguous axial images were obtained from the base of the skull through the vertex without intravenous contrast.  COMPARISON:  None.  FINDINGS: 1 cm dilated perivascular space or lacunar infarct along the inferior margin of the left lentiform nucleus.  Periventricular white matter and corona radiata hypodensities favor chronic ischemic microvascular white matter disease. Despite efforts by the technologist and patient, motion artifact is present on today's exam and could not be eliminated. This reduces exam sensitivity and specificity.  Scalp laceration along the left parietal vertex. A supernumerary or unerupted tooth is present along the inferior margin of the left maxillary sinus.  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  Hyperostosis frontalis interna.  IMPRESSION: 1. No acute intracranial findings. 2. Left parietal vertex scalp laceration. 3. 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 4. Dilated perivascular space or  remote lacunar infarct along the anterior inferior aspect the left lentiform nucleus. 5. Unerupted or supernumerary tooth along the inferior margin the left maxillary sinus.   Electronically Signed   By: Van Clines M.D.   On: 12/01/2014 21:37   Dg Chest Port 1 View  12/01/2014   CLINICAL DATA:  Dizziness and laceration to top of head after fall  EXAM:  PORTABLE CHEST - 1 VIEW  COMPARISON:  11/25/2014  FINDINGS: Mild cardiac enlargement stable. Two lead cardiac pacer unchanged. Vascular pattern is normal. Right lung is clear. Mild residual infiltrate laterally in the lingula with significantly improved aeration on the left.  IMPRESSION: Near complete clearing of bilateral infiltrates with some residual lateral left mid lung zone infiltrate. Radiographic follow-up in 4-6 weeks suggested.   Electronically Signed   By: Skipper Cliche M.D.   On: 12/01/2014 21:28    EKG:   Orders placed or performed during the hospital encounter of 12/01/14  . ED EKG  . ED EKG  . EKG 12-Lead  . EKG 12-Lead  . EKG 12-Lead  . EKG 12-Lead      Management plans discussed with the patient, family and they are in agreement.  CODE STATUS:     Code Status Orders        Start     Ordered   12/02/14 0125  Full code   Continuous     12/02/14 0124      TOTAL TIME TAKING CARE OF THIS PATIENT: 40 minutes.    Ruth Hodges M.D on 12/02/2014 at 10:06 AM  Between 7am to 6pm - Pager - [phone]  After 6pm go to [email or web address] - password [email or web address] [facility]  Office  [phone]  CC: Primary care physician; Ruth Durie, MD

## 2014-12-08 ENCOUNTER — Inpatient Hospital Stay: Payer: Medicare Other | Admitting: Family Medicine

## 2014-12-10 ENCOUNTER — Telehealth: Payer: Self-pay | Admitting: Family Medicine

## 2014-12-10 NOTE — Telephone Encounter (Signed)
Spoke with son in [employer]. Ruth Hodges had called her for a transition into care call when she got out of the hospital, she did not know she was in rehab. She will follow up with [place] when she gets out of rehab.

## 2014-12-10 NOTE — Telephone Encounter (Signed)
Son in law called saying Patient said Jiles Garter had called her but she don't know why.  She is in rehab at [facility].  She wanted him to call and find out why she was called.  His call back is [phone]  Thanks, Con Memos

## 2014-12-12 ENCOUNTER — Emergency Department: Payer: Medicare Other

## 2014-12-12 ENCOUNTER — Encounter: Payer: Self-pay | Admitting: Emergency Medicine

## 2014-12-12 ENCOUNTER — Observation Stay
Admission: EM | Admit: 2014-12-12 | Discharge: 2014-12-12 | Disposition: A | Discharge status: Skilled Nursing Facility | Payer: Medicare Other | Attending: Internal Medicine | Admitting: Internal Medicine

## 2014-12-12 DIAGNOSIS — Z8249 Family history of ischemic heart disease and other diseases of the circulatory system: Secondary | ICD-10-CM | POA: Diagnosis not present

## 2014-12-12 DIAGNOSIS — R531 Weakness: Secondary | ICD-10-CM | POA: Diagnosis not present

## 2014-12-12 DIAGNOSIS — J323 Chronic sphenoidal sinusitis: Secondary | ICD-10-CM | POA: Insufficient documentation

## 2014-12-12 DIAGNOSIS — J309 Allergic rhinitis, unspecified: Secondary | ICD-10-CM | POA: Insufficient documentation

## 2014-12-12 DIAGNOSIS — M8580 Other specified disorders of bone density and structure, unspecified site: Secondary | ICD-10-CM | POA: Diagnosis not present

## 2014-12-12 DIAGNOSIS — M1990 Unspecified osteoarthritis, unspecified site: Secondary | ICD-10-CM | POA: Insufficient documentation

## 2014-12-12 DIAGNOSIS — W19XXXA Unspecified fall, initial encounter: Secondary | ICD-10-CM | POA: Insufficient documentation

## 2014-12-12 DIAGNOSIS — R42 Dizziness and giddiness: Secondary | ICD-10-CM | POA: Insufficient documentation

## 2014-12-12 DIAGNOSIS — R748 Abnormal levels of other serum enzymes: Secondary | ICD-10-CM | POA: Diagnosis not present

## 2014-12-12 DIAGNOSIS — I2510 Atherosclerotic heart disease of native coronary artery without angina pectoris: Secondary | ICD-10-CM | POA: Diagnosis not present

## 2014-12-12 DIAGNOSIS — I482 Chronic atrial fibrillation: Secondary | ICD-10-CM | POA: Insufficient documentation

## 2014-12-12 DIAGNOSIS — Y939 Activity, unspecified: Secondary | ICD-10-CM | POA: Insufficient documentation

## 2014-12-12 DIAGNOSIS — I25110 Atherosclerotic heart disease of native coronary artery with unstable angina pectoris: Secondary | ICD-10-CM | POA: Insufficient documentation

## 2014-12-12 DIAGNOSIS — L259 Unspecified contact dermatitis, unspecified cause: Secondary | ICD-10-CM | POA: Insufficient documentation

## 2014-12-12 DIAGNOSIS — Z9049 Acquired absence of other specified parts of digestive tract: Secondary | ICD-10-CM | POA: Diagnosis not present

## 2014-12-12 DIAGNOSIS — E876 Hypokalemia: Secondary | ICD-10-CM | POA: Insufficient documentation

## 2014-12-12 DIAGNOSIS — Z950 Presence of cardiac pacemaker: Secondary | ICD-10-CM | POA: Insufficient documentation

## 2014-12-12 DIAGNOSIS — Z885 Allergy status to narcotic agent status: Secondary | ICD-10-CM | POA: Insufficient documentation

## 2014-12-12 DIAGNOSIS — Z7901 Long term (current) use of anticoagulants: Secondary | ICD-10-CM | POA: Diagnosis not present

## 2014-12-12 DIAGNOSIS — G603 Idiopathic progressive neuropathy: Secondary | ICD-10-CM | POA: Diagnosis not present

## 2014-12-12 DIAGNOSIS — E039 Hypothyroidism, unspecified: Secondary | ICD-10-CM | POA: Diagnosis not present

## 2014-12-12 DIAGNOSIS — M797 Fibromyalgia: Secondary | ICD-10-CM | POA: Insufficient documentation

## 2014-12-12 DIAGNOSIS — M790 Rheumatism, unspecified: Secondary | ICD-10-CM | POA: Insufficient documentation

## 2014-12-12 DIAGNOSIS — I495 Sick sinus syndrome: Secondary | ICD-10-CM | POA: Insufficient documentation

## 2014-12-12 DIAGNOSIS — R0789 Other chest pain: Principal | ICD-10-CM | POA: Insufficient documentation

## 2014-12-12 DIAGNOSIS — Z951 Presence of aortocoronary bypass graft: Secondary | ICD-10-CM | POA: Insufficient documentation

## 2014-12-12 DIAGNOSIS — I517 Cardiomegaly: Secondary | ICD-10-CM | POA: Insufficient documentation

## 2014-12-12 DIAGNOSIS — R55 Syncope and collapse: Secondary | ICD-10-CM | POA: Diagnosis not present

## 2014-12-12 DIAGNOSIS — I5043 Acute on chronic combined systolic (congestive) and diastolic (congestive) heart failure: Secondary | ICD-10-CM | POA: Insufficient documentation

## 2014-12-12 DIAGNOSIS — R918 Other nonspecific abnormal finding of lung field: Secondary | ICD-10-CM | POA: Diagnosis not present

## 2014-12-12 DIAGNOSIS — I252 Old myocardial infarction: Secondary | ICD-10-CM | POA: Diagnosis not present

## 2014-12-12 DIAGNOSIS — R079 Chest pain, unspecified: Secondary | ICD-10-CM | POA: Diagnosis present

## 2014-12-12 DIAGNOSIS — S0101XA Laceration without foreign body of scalp, initial encounter: Secondary | ICD-10-CM | POA: Insufficient documentation

## 2014-12-12 DIAGNOSIS — I10 Essential (primary) hypertension: Secondary | ICD-10-CM | POA: Diagnosis not present

## 2014-12-12 DIAGNOSIS — E785 Hyperlipidemia, unspecified: Secondary | ICD-10-CM | POA: Diagnosis not present

## 2014-12-12 DIAGNOSIS — F09 Unspecified mental disorder due to known physiological condition: Secondary | ICD-10-CM | POA: Diagnosis not present

## 2014-12-12 DIAGNOSIS — I214 Non-ST elevation (NSTEMI) myocardial infarction: Secondary | ICD-10-CM | POA: Diagnosis present

## 2014-12-12 DIAGNOSIS — Z888 Allergy status to other drugs, medicaments and biological substances status: Secondary | ICD-10-CM | POA: Insufficient documentation

## 2014-12-12 DIAGNOSIS — Z823 Family history of stroke: Secondary | ICD-10-CM | POA: Insufficient documentation

## 2014-12-12 DIAGNOSIS — K001 Supernumerary teeth: Secondary | ICD-10-CM | POA: Insufficient documentation

## 2014-12-12 DIAGNOSIS — I350 Nonrheumatic aortic (valve) stenosis: Secondary | ICD-10-CM | POA: Insufficient documentation

## 2014-12-12 DIAGNOSIS — R0602 Shortness of breath: Secondary | ICD-10-CM | POA: Diagnosis not present

## 2014-12-12 DIAGNOSIS — Z90710 Acquired absence of both cervix and uterus: Secondary | ICD-10-CM | POA: Insufficient documentation

## 2014-12-12 DIAGNOSIS — F329 Major depressive disorder, single episode, unspecified: Secondary | ICD-10-CM | POA: Insufficient documentation

## 2014-12-12 DIAGNOSIS — Z833 Family history of diabetes mellitus: Secondary | ICD-10-CM | POA: Diagnosis not present

## 2014-12-12 DIAGNOSIS — F419 Anxiety disorder, unspecified: Secondary | ICD-10-CM | POA: Diagnosis not present

## 2014-12-12 DIAGNOSIS — K5790 Diverticulosis of intestine, part unspecified, without perforation or abscess without bleeding: Secondary | ICD-10-CM | POA: Insufficient documentation

## 2014-12-12 DIAGNOSIS — Z7982 Long term (current) use of aspirin: Secondary | ICD-10-CM | POA: Diagnosis not present

## 2014-12-12 LAB — TROPONIN I
Troponin I: 0.04 ng/mL — ABNORMAL HIGH (ref <0.031)
Troponin I: 0.05 ng/mL — ABNORMAL HIGH (ref <0.031)
Troponin I: 0.05 ng/mL — ABNORMAL HIGH (ref <0.031)

## 2014-12-12 LAB — CBC
HCT: 30.5 % — ABNORMAL LOW (ref 35.0–47.0)
HEMOGLOBIN: 9.7 g/dL — AB (ref 12.0–16.0)
MCH: 26.7 pg (ref 26.0–34.0)
MCHC: 31.7 g/dL — ABNORMAL LOW (ref 32.0–36.0)
MCV: 84.2 fL (ref 80.0–100.0)
PLATELETS: 218 10*3/uL (ref 150–440)
RBC: 3.63 MIL/uL — AB (ref 3.80–5.20)
RDW: 17.7 % — ABNORMAL HIGH (ref 11.5–14.5)
WBC: 7.8 10*3/uL (ref 3.6–11.0)

## 2014-12-12 LAB — BASIC METABOLIC PANEL
ANION GAP: 10 (ref 5–15)
BUN: 14 mg/dL (ref 6–20)
CALCIUM: 9 mg/dL (ref 8.9–10.3)
CHLORIDE: 109 mmol/L (ref 101–111)
CO2: 23 mmol/L (ref 22–32)
CREATININE: 0.95 mg/dL (ref 0.44–1.00)
GFR calc non Af Amer: 57 mL/min — ABNORMAL LOW (ref >60)
Glucose, Bld: 91 mg/dL (ref 65–99)
Potassium: 3.4 mmol/L — ABNORMAL LOW (ref 3.5–5.1)
SODIUM: 142 mmol/L (ref 135–145)

## 2014-12-12 MED ORDER — DIGOXIN 125 MCG PO TABS
0.1250 mg | ORAL_TABLET | Freq: Every day | ORAL | Status: DC
  Administered 2014-12-12: 0.125 mg via ORAL
  Filled 2014-12-12: qty 1

## 2014-12-12 MED ORDER — ACETAMINOPHEN 325 MG PO TABS: 650.0000 mg | ORAL_TABLET | Freq: Four times a day (QID) | ORAL | Status: DC | PRN

## 2014-12-12 MED ORDER — BIOTIN 2500 MCG PO CAPS: 5000.0000 ug | ORAL_CAPSULE | Freq: Every day | ORAL | Status: DC

## 2014-12-12 MED ORDER — ASPIRIN EC 81 MG PO TBEC
81.0000 mg | DELAYED_RELEASE_TABLET | Freq: Every day | ORAL | Status: DC
  Administered 2014-12-12: 81 mg via ORAL
  Filled 2014-12-12: qty 1

## 2014-12-12 MED ORDER — NITROGLYCERIN 0.4 MG SL SUBL: 0.4000 mg | SUBLINGUAL_TABLET | SUBLINGUAL | Status: DC | PRN

## 2014-12-12 MED ORDER — INFLUENZA VAC SPLIT QUAD 0.5 ML IM SUSY: 0.5000 mL | PREFILLED_SYRINGE | INTRAMUSCULAR | Status: DC

## 2014-12-12 MED ORDER — FERROUS SULFATE 325 (65 FE) MG PO TABS
325.0000 mg | ORAL_TABLET | Freq: Every day | ORAL | Status: DC
Start: 2014-12-12 — End: 2014-12-12
  Administered 2014-12-12: 325 mg via ORAL
  Filled 2014-12-12: qty 1

## 2014-12-12 MED ORDER — MECLIZINE HCL 25 MG PO TABS: 12.5000 mg | ORAL_TABLET | Freq: Two times a day (BID) | ORAL | Status: DC | PRN

## 2014-12-12 MED ORDER — ACETAMINOPHEN 650 MG RE SUPP: 650.0000 mg | Freq: Four times a day (QID) | RECTAL | Status: DC | PRN

## 2014-12-12 MED ORDER — ONDANSETRON HCL 4 MG PO TABS: 4.0000 mg | ORAL_TABLET | Freq: Four times a day (QID) | ORAL | Status: AC | PRN

## 2014-12-12 MED ORDER — TRAMADOL HCL 50 MG PO TABS: 50.0000 mg | ORAL_TABLET | Freq: Three times a day (TID) | ORAL | Status: DC | PRN

## 2014-12-12 MED ORDER — FELODIPINE ER 10 MG PO TB24
10.0000 mg | ORAL_TABLET | Freq: Every day | ORAL | Status: DC
  Administered 2014-12-12: 10 mg via ORAL
  Filled 2014-12-12: qty 1

## 2014-12-12 MED ORDER — MORPHINE SULFATE (PF) 2 MG/ML IV SOLN
2.0000 mg | Freq: Once | INTRAVENOUS | Status: AC
  Administered 2014-12-12: 2 mg via INTRAVENOUS
  Filled 2014-12-12: qty 1

## 2014-12-12 MED ORDER — VITAMIN D 1000 UNITS PO TABS
1000.0000 [IU] | ORAL_TABLET | Freq: Every day | ORAL | Status: DC
  Administered 2014-12-12: 1000 [IU] via ORAL
  Filled 2014-12-12: qty 1

## 2014-12-12 MED ORDER — OMEGA-3-ACID ETHYL ESTERS 1 G PO CAPS
1.0000 g | ORAL_CAPSULE | Freq: Every day | ORAL | Status: DC
  Administered 2014-12-12: 1 g via ORAL
  Filled 2014-12-12: qty 1

## 2014-12-12 MED ORDER — SIMVASTATIN 20 MG PO TABS
20.0000 mg | ORAL_TABLET | Freq: Every day | ORAL | Status: DC
  Administered 2014-12-12: 20 mg via ORAL
  Filled 2014-12-12: qty 1

## 2014-12-12 MED ORDER — METOPROLOL TARTRATE 25 MG PO TABS
12.5000 mg | ORAL_TABLET | Freq: Two times a day (BID) | ORAL | Status: DC
  Filled 2014-12-12: qty 1

## 2014-12-12 MED ORDER — AMITRIPTYLINE HCL 25 MG PO TABS
75.0000 mg | ORAL_TABLET | Freq: Every day | ORAL | Status: DC
  Filled 2014-12-12: qty 1

## 2014-12-12 MED ORDER — ALUM & MAG HYDROXIDE-SIMETH 200-200-20 MG/5ML PO SUSP: 30.0000 mL | Freq: Four times a day (QID) | ORAL | Status: DC | PRN

## 2014-12-12 MED ORDER — DIPHENHYDRAMINE HCL 25 MG PO CAPS
25.0000 mg | ORAL_CAPSULE | Freq: Four times a day (QID) | ORAL | Status: DC | PRN
  Administered 2014-12-12: 25 mg via ORAL
  Filled 2014-12-12: qty 1

## 2014-12-12 MED ORDER — POLYETHYLENE GLYCOL 3350 17 G PO PACK
17.0000 g | PACK | Freq: Every day | ORAL | Status: DC
Start: 2014-12-12 — End: 2014-12-12
  Filled 2014-12-12: qty 1

## 2014-12-12 MED ORDER — TORSEMIDE 20 MG PO TABS
20.0000 mg | ORAL_TABLET | Freq: Every day | ORAL | Status: DC
  Administered 2014-12-12: 20 mg via ORAL
  Filled 2014-12-12: qty 1

## 2014-12-12 MED ORDER — MIRTAZAPINE 15 MG PO TABS
7.5000 mg | ORAL_TABLET | Freq: Every day | ORAL | Status: DC
  Filled 2014-12-12: qty 1

## 2014-12-12 MED ORDER — METOPROLOL TARTRATE 25 MG PO TABS: 12.5000 mg | ORAL_TABLET | Freq: Two times a day (BID) | ORAL | Status: AC

## 2014-12-12 MED ORDER — AMIODARONE HCL 200 MG PO TABS
400.0000 mg | ORAL_TABLET | Freq: Every day | ORAL | Status: DC
  Administered 2014-12-12: 400 mg via ORAL
  Filled 2014-12-12: qty 2

## 2014-12-12 MED ORDER — APIXABAN 5 MG PO TABS
5.0000 mg | ORAL_TABLET | Freq: Two times a day (BID) | ORAL | Status: DC
  Administered 2014-12-12: 5 mg via ORAL
  Filled 2014-12-12: qty 1

## 2014-12-12 MED ORDER — PANTOPRAZOLE SODIUM 40 MG PO TBEC
40.0000 mg | DELAYED_RELEASE_TABLET | Freq: Every day | ORAL | Status: DC
Start: 2014-12-12 — End: 2014-12-12
  Administered 2014-12-12: 40 mg via ORAL
  Filled 2014-12-12: qty 1

## 2014-12-12 MED ORDER — ONDANSETRON HCL 4 MG/2ML IJ SOLN: 4.0000 mg | Freq: Four times a day (QID) | INTRAMUSCULAR | Status: DC | PRN

## 2014-12-12 MED ORDER — VITAMIN C 500 MG PO TABS
250.0000 mg | ORAL_TABLET | Freq: Every day | ORAL | Status: DC
  Administered 2014-12-12: 250 mg via ORAL
  Filled 2014-12-12: qty 1

## 2014-12-12 MED ORDER — ISOSORBIDE MONONITRATE ER 30 MG PO TB24
30.0000 mg | ORAL_TABLET | Freq: Every day | ORAL | Status: DC
  Administered 2014-12-12: 30 mg via ORAL
  Filled 2014-12-12: qty 1

## 2014-12-12 MED ORDER — LOSARTAN POTASSIUM 50 MG PO TABS
100.0000 mg | ORAL_TABLET | Freq: Every day | ORAL | Status: DC
  Administered 2014-12-12: 100 mg via ORAL
  Filled 2014-12-12: qty 2

## 2014-12-12 MED ORDER — VITAMIN B-12 100 MCG PO TABS
100.0000 ug | ORAL_TABLET | Freq: Every day | ORAL | Status: DC
  Administered 2014-12-12: 100 ug via ORAL
  Filled 2014-12-12: qty 1

## 2014-12-12 MED ORDER — DOCUSATE SODIUM 100 MG PO CAPS
100.0000 mg | ORAL_CAPSULE | Freq: Two times a day (BID) | ORAL | Status: DC
  Administered 2014-12-12: 100 mg via ORAL
  Filled 2014-12-12: qty 1

## 2014-12-12 MED ORDER — INFLUENZA VAC SPLIT QUAD 0.5 ML IM SUSY: 0.5000 mL | PREFILLED_SYRINGE | INTRAMUSCULAR | Status: AC

## 2014-12-12 MED ORDER — POLYETHYLENE GLYCOL 3350 17 G PO PACK: 17.0000 g | PACK | Freq: Every day | ORAL | Status: AC

## 2014-12-12 MED ORDER — LEVOTHYROXINE SODIUM 150 MCG PO TABS
150.0000 ug | ORAL_TABLET | Freq: Every day | ORAL | Status: DC
  Filled 2014-12-12: qty 1

## 2014-12-12 MED ORDER — ONDANSETRON HCL 4 MG PO TABS: 4.0000 mg | ORAL_TABLET | Freq: Four times a day (QID) | ORAL | Status: DC | PRN

## 2014-12-12 MED ORDER — SODIUM CHLORIDE 0.9 % IJ SOLN
3.0000 mL | Freq: Two times a day (BID) | INTRAMUSCULAR | Status: DC
  Administered 2014-12-12: 3 mL via INTRAVENOUS

## 2014-12-12 MED ORDER — ASPIRIN EC 325 MG PO TBEC: 325.0000 mg | DELAYED_RELEASE_TABLET | Freq: Once | ORAL | Status: DC

## 2014-12-12 NOTE — ED Notes (Signed)
Per Dr. Owens Shark, hold ASA for now

## 2014-12-12 NOTE — Progress Notes (Signed)
Patient alert and oriented x4. Oriented to room, unit, and call bell. Admission completed. No complaints at this time. Will cont to assess. Skin assessment verified. Telemetry box verified. Wilnette Kales

## 2014-12-12 NOTE — Progress Notes (Signed)
Spoke with Dr. Margaretmary Eddy, stated that patient can be d/c'd after 2nd troponin has resulted. Dr. Margaretmary Eddy called after troponin results and is okay with patient leaving. Wilnette Kales

## 2014-12-12 NOTE — ED Notes (Signed)
Pt to rm 2 via EMS from [facility] c/o central chest pain.  Facility gave 3 nitro which increased pain.  Pt not given ASA.  EMS report o2sat 94% on RA and 2L [place] applied.  Facility reports pt oriented to baseline.  Pt NAD upon arrival.

## 2014-12-12 NOTE — Discharge Summary (Signed)
[facility] NAME: Ruth Hodges    MR#:  [id number]  DATE OF BIRTH:  Dec 06, 1940  DATE OF ADMISSION:  12/12/2014 ADMITTING PHYSICIAN: Nicholes Mango, MD  DATE OF DISCHARGE: 12/12/14 PRIMARY CARE PHYSICIAN: Wilhemena Durie, MD    ADMISSION DIAGNOSIS:  NSTEMI (non-ST elevated myocardial infarction) [I21.4] Chest pain, unspecified chest pain type [R07.9]  DISCHARGE DIAGNOSIS:   Atypical chest pain SECONDARY DIAGNOSIS:   Past Medical History  Diagnosis Date  . Anxiety   . Depression   . A-fib   . Arthritis   . Hypertension   . Hyperlipidemia   . Thyroid disease   . CAD (coronary artery disease)     HOSPITAL COURSE:   74 year old female presenting [facility] department [facility] with a chief complaint of chest pain started from last night radiating to the upper back and bilateral shoulders  1. Chest pain with a history of coronary artery disease Telemetry OBSERVATION status, no RVR Provided nitroglycerin as needed, aspirin, statin. Added small dose BB to the regimen Cycle cardiac biomarkers, with no significant trend Cardiology consult is placed to Dr. Nehemiah Massed D/c pt home as AMI is ruled out Outpatient follow-up with cardiac rehabilitation is recommended  2. History of hypertension Blood pressure is well controlled. Continue her home medications.  3. Chronic history of atrial fibrillation-rate controlled We'll continue home medication digoxin, amiodarone and Eliquis  4. Chronic history of fibromyalgia  Continue tramadol as needed   5. History of hyperlipidemia continue statin  6. Hypokalemia- replace with KCL    DISCHARGE CONDITIONS:   fair  CONSULTS OBTAINED:  Treatment Team:  Corey Skains, MD   PROCEDURES none  DRUG ALLERGIES:   Allergies  Allergen Reactions  . Codeine Other (See Comments)    Hallucination  . Ceftin  [Cefuroxime Axetil] Other (See  Comments)    DISCHARGE MEDICATIONS:   Current Discharge Medication List    START taking these medications   Details  Influenza vac split quadrivalent PF (FLUARIX) 0.5 ML injection Inject 0.5 mLs into the muscle tomorrow at 10 am. Qty: 0.5 mL, Refills: 0    metoprolol tartrate (LOPRESSOR) 25 MG tablet Take 0.5 tablets (12.5 mg total) by mouth 2 (two) times daily. Qty: 30 tablet, Refills: 0    ondansetron (ZOFRAN) 4 MG tablet Take 1 tablet (4 mg total) by mouth every 6 (six) hours as needed for nausea. Qty: 20 tablet, Refills: 0      CONTINUE these medications which have CHANGED   Details  polyethylene glycol (MIRALAX / GLYCOLAX) packet Take 17 g by mouth daily. Qty: 14 each, Refills: 0      CONTINUE these medications which have NOT CHANGED   Details  amiodarone (PACERONE) 400 MG tablet Take 1 tablet (400 mg total) by mouth daily. Qty: 30 tablet, Refills: 0    amitriptyline (ELAVIL) 75 MG tablet Take 75 mg by mouth at bedtime.    apixaban (ELIQUIS) 5 MG TABS tablet Take 5 mg by mouth 2 (two) times daily.     Biotin 2500 MCG CAPS Take 5,000 mcg by mouth daily.     Cholecalciferol (VITAMIN D3) 1000 UNITS CAPS Take 1 capsule by mouth daily.     cyanocobalamin 100 MCG tablet Take 100 mcg by mouth daily.     digoxin (LANOXIN) 0.125 MG tablet Take 1 tablet (0.125 mg total) by mouth daily. Qty: 30 tablet, Refills: 0    felodipine (PLENDIL)  10 MG 24 hr tablet Take 10 mg by mouth daily.     ferrous sulfate 325 (65 FE) MG tablet Take 325 mg by mouth daily.    fluocinonide cream (LIDEX) 7.59 % Apply 1 application topically 3 (three) times daily. To rash except face Qty: 15 g, Refills: 0   Associated Diagnoses: Contact dermatitis    isosorbide mononitrate (IMDUR) 30 MG 24 hr tablet Take 30 mg by mouth daily.   Associated Diagnoses: MI, acute, non ST segment elevation    losartan (COZAAR) 100 MG tablet Take 100 mg by mouth daily.     mirtazapine (REMERON) 7.5 MG tablet Take  7.5 mg by mouth at bedtime.    Omega-3 Fatty Acids (FISH OIL) 1000 MG CAPS Take 1,000 mg by mouth daily.    omeprazole (PRILOSEC) 20 MG capsule Take 20 mg by mouth daily.    simvastatin (ZOCOR) 20 MG tablet Take 20 mg by mouth daily.    torsemide (DEMADEX) 20 MG tablet Take 1 tablet (20 mg total) by mouth daily. Qty: 30 tablet, Refills: 0    traMADol (ULTRAM) 50 MG tablet Take 1 tablet (50 mg total) by mouth 3 (three) times daily as needed. Qty: 30 tablet, Refills: 0    vitamin C (ASCORBIC ACID) 250 MG tablet Take 250 mg by mouth daily.    alum & mag hydroxide-simeth (MAALOX/MYLANTA) 200-200-20 MG/5ML suspension Take 30 mLs by mouth every 6 (six) hours as needed for indigestion or heartburn (dyspepsia). Qty: 355 mL, Refills: 0    levothyroxine (SYNTHROID, LEVOTHROID) 150 MCG tablet Take 150 mcg by mouth daily.     meclizine (ANTIVERT) 12.5 MG tablet Take 1 tablet (12.5 mg total) by mouth 2 (two) times daily as needed for dizziness. Qty: 30 tablet, Refills: 0    nitroGLYCERIN (NITROSTAT) 0.4 MG SL tablet Place 0.4 mg under the tongue.   Associated Diagnoses: MI, acute, non ST segment elevation         DISCHARGE INSTRUCTIONS:   F/U WITH PCP IN 3 DAYS Follow-up with cardiology in a week Follow-up with cardiac rehabilitation in a week  DIET:  Cardiac diet  DISCHARGE CONDITION:  Fair  ACTIVITY:  Activity as tolerated  OXYGEN:  Home Oxygen: No.   Oxygen Delivery: room air  DISCHARGE LOCATION:  nursing home   If you experience worsening of your admission symptoms, develop shortness of breath, life threatening emergency, suicidal or homicidal thoughts you must seek medical attention immediately by calling 911 or calling your MD immediately  if symptoms less severe.  You Must read complete instructions/literature along with all the possible adverse reactions/side effects for all the Medicines you take and that have been prescribed to you. Take any new Medicines after  you have completely understood and accpet all the possible adverse reactions/side effects.   Please note  You were cared for by a hospitalist during your hospital stay. If you have any questions about your discharge medications or the care you received while you were in the hospital after you are discharged, you can call the unit and asked to speak with the hospitalist on call if the hospitalist that took care of you is not available. Once you are discharged, your primary care physician will handle any further medical issues. Please note that NO REFILLS for any discharge medications will be authorized once you are discharged, as it is imperative that you return to your primary care physician (or establish a relationship with a primary care physician if you do not have  one) for your aftercare needs so that they can reassess your need for medications and monitor your lab values.     Today  Chief Complaint  Patient presents with  . Chest Pain   Chest pain is resolved and feeling better with no other complaints   ROS: none  CONSTITUTIONAL: Denies fevers, chills. Denies any fatigue, weakness.  EYES: Denies blurry vision, double vision, eye pain. EARS, NOSE, THROAT: Denies tinnitus, ear pain, hearing loss. RESPIRATORY: Denies cough, wheeze, shortness of breath.  CARDIOVASCULAR: Denies chest pain, palpitations, edema.  GASTROINTESTINAL: Denies nausea, vomiting, diarrhea, abdominal pain. Denies bright red blood per rectum. GENITOURINARY: Denies dysuria, hematuria. ENDOCRINE: Denies nocturia or thyroid problems. HEMATOLOGIC AND LYMPHATIC: Denies easy bruising or bleeding. SKIN: Denies rash or lesion. MUSCULOSKELETAL: Denies pain in neck, back, shoulder, knees, hips or arthritic symptoms.  NEUROLOGIC: Denies paralysis, paresthesias.  PSYCHIATRIC: Denies anxiety or depressive symptoms.   VITAL SIGNS:  Blood pressure 141/55, pulse 62, temperature 98.2 F (36.8 C), temperature source Oral,  resp. rate 20, height 5\' 4"  (1.626 m), weight 76.204 kg (168 lb), SpO2 96 %.  I/O:  No intake or output data in the 24 hours ending 12/12/14 1403  PHYSICAL EXAMINATION:  GENERAL:  74 y.o.-year-old patient lying in the bed with no acute distress.  EYES: Pupils equal, round, reactive to light and accommodation. No scleral icterus. Extraocular muscles intact.  HEENT: Head atraumatic, normocephalic. Oropharynx and nasopharynx clear.  NECK:  Supple, no jugular venous distention. No thyroid enlargement, no tenderness.  LUNGS: Normal breath sounds bilaterally, no wheezing, rales,rhonchi or crepitation. No use of accessory muscles of respiration.  CARDIOVASCULAR: S1, S2 normal. No murmurs, rubs, or gallops.  ABDOMEN: Soft, non-tender, non-distended. Bowel sounds present. No organomegaly or mass.  EXTREMITIES: No pedal edema, cyanosis, or clubbing.  NEUROLOGIC: Cranial nerves II through XII are intact. Muscle strength 5/5 in all extremities. Sensation intact. Gait not checked.  PSYCHIATRIC: The patient is alert and oriented x 3.  SKIN: No obvious rash, lesion, or ulcer.   DATA REVIEW:   CBC  Recent Labs Lab 12/12/14 0515  WBC 7.8  HGB 9.7*  HCT 30.5*  PLT 218    Chemistries   Recent Labs Lab 12/12/14 0515  NA 142  K 3.4*  CL 109  CO2 23  GLUCOSE 91  BUN 14  CREATININE 0.95  CALCIUM 9.0    Cardiac Enzymes  Recent Labs Lab 12/12/14 1147  TROPONINI 0.05*    Microbiology Results  Results for orders placed or performed during the hospital encounter of 11/23/14  MRSA PCR Screening     Status: None   Collection Time: 11/23/14  3:18 PM  Result Value Ref Range Status   MRSA by PCR NEGATIVE NEGATIVE Final    Comment:        The GeneXpert MRSA Assay (FDA approved for NASAL specimens only), is one component of a comprehensive MRSA colonization surveillance program. It is not intended to diagnose MRSA infection nor to guide or monitor treatment for MRSA infections.    Culture, blood (routine x 2)     Status: None   Collection Time: 11/24/14  7:07 AM  Result Value Ref Range Status   Specimen Description BLOOD BOTH 5ML  Final   Special Requests Immunocompromised RIGHT HAND  Final   Culture NO GROWTH 5 DAYS  Final   Report Status 11/29/2014 FINAL  Final  Culture, blood (routine x 2)     Status: None   Collection Time: 11/24/14  7:12 AM  Result Value Ref Range Status   Specimen Description BLOOD BOTH 5ML  Final   Special Requests Immunocompromised LEFT HAND  Final   Culture  Setup Time   Final    GRAM POSITIVE COCCI ANAEROBIC BOTTLE ONLY CRITICAL RESULT CALLED TO, READ BACK BY AND VERIFIED WITH: IRIS GUIDRY @ 5176 8.9.16 MPG    Culture   Final    VIRIDANS STREPTOCOCCUS SENSITIVITIES PERFORMED PER DR. FITZGERALD    Report Status 11/28/2014 FINAL  Final   Organism ID, Bacteria VIRIDANS STREPTOCOCCUS  Final      Susceptibility   Viridans streptococcus - MIC*    ERYTHROMYCIN Value in next row Sensitive      SENSITIVE0.12    VANCOMYCIN Value in next row Sensitive      SENSITIVE0.5    LEVOFLOXACIN Value in next row Sensitive      SENSITIVE2    CEFTRIAXONE Value in next row Sensitive      SENSITIVE0.12    PENICILLIN Value in next row Sensitive      SENSITIVE0.12    * VIRIDANS STREPTOCOCCUS  Culture, blood (routine x 2)     Status: None   Collection Time: 11/26/14  8:11 AM  Result Value Ref Range Status   Specimen Description BLOOD LEFT WRIST  Final   Special Requests BOTTLES DRAWN AEROBIC AND ANAEROBIC  7 CC  Final   Culture NO GROWTH 5 DAYS  Final   Report Status 12/01/2014 FINAL  Final  Culture, blood (routine x 2)     Status: None   Collection Time: 11/26/14  8:25 AM  Result Value Ref Range Status   Specimen Description BLOOD RIGHT HAND  Final   Special Requests BOTTLES DRAWN AEROBIC AND ANAEROBIC  5 CC  Final   Culture NO GROWTH 5 DAYS  Final   Report Status 12/01/2014 FINAL  Final    RADIOLOGY:  Dg Chest 2 View  12/12/2014    CLINICAL DATA:  Mid chest pain and shortness of breath this morning.  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 The patient is post median sternotomy. The cardiomediastinal contours are unchanged with mild cardiomegaly. Improved aeration in the left midlung zone, no new consolidation. No pulmonary edema, pleural effusion or pneumothorax. Osseous structures are intact.  IMPRESSION: No acute pulmonary process. Improved aeration of the left midlung zone from prior.   Electronically Signed   By: Jeb Levering M.D.   On: 12/12/2014 05:43    EKG:   Orders placed or performed during the hospital encounter of 12/12/14  . EKG 12-Lead  . EKG 12-Lead  . ED EKG within 10 minutes  . ED EKG within 10 minutes      Management plans discussed with the patient, family and they are in agreement.  CODE STATUS:     Code Status Orders        Start     Ordered   12/12/14 1025  Full code   Continuous     12/12/14 1024    Advance Directive Documentation        Most Recent Value   Type of Advance Directive  Healthcare Power of [place], Living will [POA daughter Lattie Haw Waddell]   Pre-existing out of facility DNR order (yellow form or pink MOST form)     "MOST" Form in Place?        TOTAL TIME TAKING CARE OF THIS PATIENT: 40  minutes.    @[facility] @  on  12/12/2014 at 2:03 PM  Between 7am to 6pm - Pager - [phone]  After 6pm go to [email or web address] - password [email or web address] [facility]  Office  [phone]  CC: Primary care physician; Wilhemena Durie, MD

## 2014-12-12 NOTE — Consult Note (Signed)
[facility] Consultation Note  Patient ID: Ruth Hodges, MRN: [id number], DOB/AGE: 76-Dec-1942 76 y.o. Admit date: 12/12/2014   Date of Consult: 12/12/2014 Primary Physician: Wilhemena Durie, MD Primary Cardiologist: Nehemiah Massed  Chief Complaint:  Chief Complaint  Patient presents with  . Chest Pain   Reason for Consult: chest discomfort  HPI: 74 y.o. female with known aortic valve stenosis with sick sinus syndrome status post pacemaker placement with chronic nonvalvular atrial fibrillation coronary artery disease with recent cardiac catheterization at recent hospital visit showing normal coronary artery bypass graft with normal blood flow and no evidence of significant stenosis. The patient additionally has had atrial fibrillation with rapid ventricular rate and was placed on amiodarone for which the patient was recently seen in the office with additional oral medication management of amiodarone. She had been in normal sinus rhythm at that time. The patient then has presented to the emergency room with chest discomfort weakness fatigue and palpitations and presyncope. With this the patient has had an EKG showing atrial fibrillation with nonspecific ST and T-wave changes and possibly will need further medication management due to the fact that her heart rate may have been fast causing symptoms listed above. There is no current evidence of myocardial infarction with a normal troponin and no evidence of congestive heart failure at this time  Past Medical History  Diagnosis Date  . Anxiety   . Depression   . A-fib   . Arthritis   . Hypertension   . Hyperlipidemia   . Thyroid disease   . CAD (coronary artery disease)       Surgical History:  Past Surgical History  Procedure Laterality Date  . Pacemaker insertion    . Cardiac catheterization    . Bladder surgery    . Cholecystectomy    . Appendectomy    . Coronary artery bypass graft    . Vaginal hysterectomy  1972     total, ovaries removed in 1983  . Cardiac catheterization N/A 11/27/2014    Procedure: Left Heart Cath and Cors/Grafts Angiography;  Surgeon: Teodoro Spray, MD;  Location: [facility];  Service: Cardiovascular;  Laterality: N/A;     Home Meds: Prior to Admission medications   Medication Sig Start Date End Date Taking? Authorizing Provider  amiodarone (PACERONE) 400 MG tablet Take 1 tablet (400 mg total) by mouth daily. Patient taking differently: Take 200 mg by mouth daily.  12/01/14  Yes Srikar Sudini, MD  amitriptyline (ELAVIL) 75 MG tablet Take 75 mg by mouth at bedtime.   Yes Historical Provider, MD  apixaban (ELIQUIS) 5 MG TABS tablet Take 5 mg by mouth 2 (two) times daily.  08/31/14  Yes Historical Provider, MD  Biotin 2500 MCG CAPS Take 5,000 mcg by mouth daily.  08/31/14  Yes Historical Provider, MD  Cholecalciferol (VITAMIN D3) 1000 UNITS CAPS Take 1 capsule by mouth daily.  08/31/14  Yes Historical Provider, MD  cyanocobalamin 100 MCG tablet Take 100 mcg by mouth daily.  08/31/14  Yes Historical Provider, MD  digoxin (LANOXIN) 0.125 MG tablet Take 1 tablet (0.125 mg total) by mouth daily. 12/02/14  Yes Gladstone Lighter, MD  felodipine (PLENDIL) 10 MG 24 hr tablet Take 10 mg by mouth daily.  08/31/14  Yes Historical Provider, MD  ferrous sulfate 325 (65 FE) MG tablet Take 325 mg by mouth daily.   Yes Historical Provider, MD  fluocinonide cream (LIDEX) 3.57 % Apply 1 application topically 3 (three) times daily. To rash  except face 11/18/14  Yes Carmon Ginsberg, PA  isosorbide mononitrate (IMDUR) 30 MG 24 hr tablet Take 30 mg by mouth daily. 10/27/14 10/27/15 Yes Historical Provider, MD  losartan (COZAAR) 100 MG tablet Take 100 mg by mouth daily.  04/29/14  Yes Historical Provider, MD  mirtazapine (REMERON) 7.5 MG tablet Take 7.5 mg by mouth at bedtime.   Yes Historical Provider, MD  Omega-3 Fatty Acids (FISH OIL) 1000 MG CAPS Take 1,000 mg by mouth daily.   Yes Historical Provider, MD   omeprazole (PRILOSEC) 20 MG capsule Take 20 mg by mouth daily.   Yes Historical Provider, MD  polyethylene glycol (MIRALAX / GLYCOLAX) packet Take 17 g by mouth daily.   Yes Historical Provider, MD  simvastatin (ZOCOR) 20 MG tablet Take 20 mg by mouth daily.   Yes Historical Provider, MD  torsemide (DEMADEX) 20 MG tablet Take 1 tablet (20 mg total) by mouth daily. 12/02/14  Yes Gladstone Lighter, MD  traMADol (ULTRAM) 50 MG tablet Take 1 tablet (50 mg total) by mouth 3 (three) times daily as needed. 12/02/14  Yes Gladstone Lighter, MD  vitamin C (ASCORBIC ACID) 250 MG tablet Take 250 mg by mouth daily.   Yes Historical Provider, MD  alum & mag hydroxide-simeth (MAALOX/MYLANTA) 200-200-20 MG/5ML suspension Take 30 mLs by mouth every 6 (six) hours as needed for indigestion or heartburn (dyspepsia). 11/27/14   Teodoro Spray, MD  levothyroxine (SYNTHROID, LEVOTHROID) 150 MCG tablet Take 150 mcg by mouth daily.  04/29/14   Historical Provider, MD  meclizine (ANTIVERT) 12.5 MG tablet Take 1 tablet (12.5 mg total) by mouth 2 (two) times daily as needed for dizziness. 12/02/14   Gladstone Lighter, MD  nitroGLYCERIN (NITROSTAT) 0.4 MG SL tablet Place 0.4 mg under the tongue. 10/27/14 10/27/15  Historical Provider, MD    Inpatient Medications:  . amiodarone  400 mg Oral Daily  . amitriptyline  75 mg Oral QHS  . apixaban  5 mg Oral BID  . aspirin EC  325 mg Oral Once  . aspirin EC  81 mg Oral Daily  . cholecalciferol  1,000 Units Oral Daily  . digoxin  0.125 mg Oral Daily  . docusate sodium  100 mg Oral BID  . felodipine  10 mg Oral Daily  . ferrous sulfate  325 mg Oral Daily  . [START ON 12/13/2014] Influenza vac split quadrivalent PF  0.5 mL Intramuscular Tomorrow-1000  . isosorbide mononitrate  30 mg Oral Daily  . [START ON 12/13/2014] levothyroxine  150 mcg Oral QAC breakfast  . losartan  100 mg Oral Daily  . mirtazapine  7.5 mg Oral QHS  . omega-3 acid ethyl esters  1 g Oral Daily  . pantoprazole   40 mg Oral Daily  . polyethylene glycol  17 g Oral Daily  . simvastatin  20 mg Oral Daily  . sodium chloride  3 mL Intravenous Q12H  . torsemide  20 mg Oral Daily  . cyanocobalamin  100 mcg Oral Daily  . vitamin C  250 mg Oral Daily      Allergies:  Allergies  Allergen Reactions  . Codeine Other (See Comments)    Hallucination  . Ceftin  [Cefuroxime Axetil] Other (See Comments)    Social History   Social History  . Marital Status: Married    Spouse Name: N/A  . Number of Children: N/A  . Years of Education: N/A   Occupational History  . Not on file.   Social History Main Topics  .  Smoking status: Never Smoker   . Smokeless tobacco: Not on file  . Alcohol Use: No  . Drug Use: No  . Sexual Activity: Not on file   Other Topics Concern  . Not on file   Social History Narrative     Family History  Problem Relation Age of Onset  . Heart attack Father   . CVA Sister   . Diabetes Brother   . Heart attack Brother      Review of Systems Positive for palpitations irregular heart beat shortness of breath and chest pain Negative for: General:  chills, fever, night sweats or weight changes.  Cardiovascular: PND orthopnea syncope dizziness  Dermatological skin lesions rashes Respiratory: Cough congestion Urologic: Frequent urination urination at night and hematuria Abdominal: negative for nausea, vomiting, diarrhea, bright red blood per rectum, melena, or hematemesis Neurologic: negative for visual changes, and/or hearing changes  All other systems reviewed and are otherwise negative except as noted above.  Labs:  Recent Labs  12/12/14 0515  TROPONINI 0.04*   Lab Results  Component Value Date   WBC 7.8 12/12/2014   HGB 9.7* 12/12/2014   HCT 30.5* 12/12/2014   MCV 84.2 12/12/2014   PLT 218 12/12/2014    Recent Labs Lab 12/12/14 0515  NA 142  K 3.4*  CL 109  CO2 23  BUN 14  CREATININE 0.95  CALCIUM 9.0  GLUCOSE 91   No results found for: CHOL,  HDL, LDLCALC, TRIG No results found for: DDIMER  Radiology/Studies:  Dg Chest 2 View  12/12/2014   CLINICAL DATA:  Mid chest pain and shortness of breath this morning.  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 The patient is post median sternotomy. The cardiomediastinal contours are unchanged with mild cardiomegaly. Improved aeration in the left midlung zone, no new consolidation. No pulmonary edema, pleural effusion or pneumothorax. Osseous structures are intact.  IMPRESSION: No acute pulmonary process. Improved aeration of the left midlung zone from prior.   Electronically Signed   By: Jeb Levering M.D.   On: 12/12/2014 05:43   Ct Head Wo Contrast  12/01/2014   CLINICAL DATA:  Laceration along the vertex after a fall.  EXAM: CT HEAD WITHOUT CONTRAST  TECHNIQUE: Contiguous axial images were obtained from the base of the skull through the vertex without intravenous contrast.  COMPARISON:  None.  FINDINGS: 1 cm dilated perivascular space or lacunar infarct along the inferior margin of the left lentiform nucleus.  Periventricular white matter and corona radiata hypodensities favor chronic ischemic microvascular white matter disease. Despite efforts by the technologist and patient, motion artifact is present on today's exam and could not be eliminated. This reduces exam sensitivity and specificity.  Scalp laceration along the left parietal vertex. A supernumerary or unerupted tooth is present along the inferior margin of the left maxillary sinus.  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  Hyperostosis frontalis interna.  IMPRESSION: 1. No acute intracranial findings. 2. Left parietal vertex scalp laceration. 3. 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 4. Dilated perivascular space or remote lacunar infarct along the anterior inferior aspect the left lentiform nucleus. 5. Unerupted or supernumerary tooth along the inferior margin the  left maxillary sinus.   Electronically Signed   By: Van Clines M.D.   On: 12/01/2014 21:37   Ct Chest Wo Contrast  11/23/2014   CLINICAL DATA:  Chest pain. Originally ordered for dissection, but the IV infiltrated and was  evaluated by Dr. Martinique. No intravenous contrast is present within the chest.  EXAM: CT CHEST WITHOUT CONTRAST  TECHNIQUE: Multidetector CT imaging of the chest was performed following the standard protocol without IV contrast.  COMPARISON:  None.  FINDINGS: The central airways are patent. The lungs are clear. There is no pleural effusion or pneumothorax.  There are no pathologically enlarged axillary, hilar or mediastinal lymph nodes.  The heart size is enlarged. There is evidence of prior CABG. There is no pericardial effusion. The thoracic aorta is normal in caliber.  Review of bone windows demonstrates no focal lytic or sclerotic lesions.  Limited non-contrast images of the upper abdomen were obtained. The adrenal glands appear normal. There is a mild nodular contour of the liver as can be seen with hepatocellular disease.  IMPRESSION: 1. No acute disease of the chest. Aortic dissection cannot be evaluated on this examination secondary to lack of intravenous contrast.   Electronically Signed   By: Kathreen Devoid   On: 11/23/2014 22:30   Dg Chest Port 1 View  12/01/2014   CLINICAL DATA:  Dizziness and laceration to top of head after fall  EXAM: PORTABLE CHEST - 1 VIEW  COMPARISON:  11/25/2014  FINDINGS: Mild cardiac enlargement stable. Two lead cardiac pacer unchanged. Vascular pattern is normal. Right lung is clear. Mild residual infiltrate laterally in the lingula with significantly improved aeration on the left.  IMPRESSION: Near complete clearing of bilateral infiltrates with some residual lateral left mid lung zone infiltrate. Radiographic follow-up in 4-6 weeks suggested.   Electronically Signed   By: Skipper Cliche M.D.   On: 12/01/2014 21:28   Dg Chest Port 1  View  11/25/2014   CLINICAL DATA:  Congestive heart failure.  EXAM: PORTABLE CHEST - 1 VIEW  COMPARISON:  November 23, 2014.  FINDINGS: Stable cardiomegaly. Status post coronary artery bypass graft. Left-sided pacemaker is unchanged in position. Right-sided PICC line is unchanged with distal tip overlying expected position of the SVC. No pneumothorax is noted. Increased right upper lobe opacity is noted concerning for developing pneumonia or possibly edema. Stable central pulmonary vascular congestion is noted. Central pulmonary vascular congestion is noted increased bibasilar opacities are noted concerning for subsegmental atelectasis or possibly edema.  IMPRESSION: Increased bibasilar opacities are noted concerning for subsegmental atelectasis or possibly edema. Increased right upper lobe opacity is noted concerning for pneumonia or possibly edema. Continued radiographic follow-up is recommended.   Electronically Signed   By: Marijo Conception, M.D.   On: 11/25/2014 13:48   Dg Chest Port 1 View  11/23/2014   CLINICAL DATA:  74 year old female status post central line placement  EXAM: PORTABLE CHEST - 1 VIEW  COMPARISON:  Prior chest x-ray obtained earlier today  FINDINGS: New right upper extremity approach PICC. Catheter tip overlies the distal SVC in good position. Stable position of left subclavian approach cardiac rhythm maintenance device with leads projecting over the right atrium and right ventricle. Stable mild cardiomegaly. Patient is status post median sternotomy. Mild vascular congestion without overt edema. No evidence of pneumothorax or pleural effusion. No acute osseous abnormality.  IMPRESSION: The tip of the new right upper extremity PICC projects over the distal SVC.   Electronically Signed   By: Jacqulynn Cadet M.D.   On: 11/23/2014 21:28   Dg Chest Port 1 View  11/23/2014   CLINICAL DATA:  Chest pain, history of prior myocardial infarction and coronary bypass grafting  EXAM: PORTABLE CHEST - 1  VIEW  COMPARISON:  10/26/2014  FINDINGS: Cardiac shadow is at the upper limits of normal in size. A pacing device is again seen and stable. Postsurgical changes are seen. The lungs are well aerated bilaterally without focal infiltrate or sizable effusion.  IMPRESSION: No active disease.   Electronically Signed   By: Inez Catalina M.D.   On: 11/23/2014 12:45    EKG: Atrial fibrillation with nonspecific ST and T-wave changes  Weights: Filed Weights   12/12/14 0459  Weight: 168 lb (76.204 kg)     Physical Exam: Blood pressure 131/52, pulse 60, temperature 98.2 F (36.8 C), temperature source Oral, resp. rate 20, height 5\' 4"  (1.626 m), weight 168 lb (76.204 kg), SpO2 96 %. Body mass index is 28.82 kg/(m^2). General: Well developed, well nourished, in no acute distress. Head eyes ears nose throat: Normocephalic, atraumatic, sclera non-icteric, no xanthomas, nares are without discharge. No apparent thyromegaly and/or mass  Lungs: Normal respiratory effort.  no wheezes, no rales, no rhonchi.  Heart: Irregular with normal S1 soft S2. 3+ aortic murmur gallop, no rub, PMI is normal size and placement, carotid upstroke normal without bruit, jugular venous pressure is normal Abdomen: Soft, non-tender, non-distended with normoactive bowel sounds. No hepatomegaly. No rebound/guarding. No obvious abdominal masses. Abdominal aorta is normal size without bruit Extremities: Trace edema. no cyanosis, no clubbing, no ulcers  Peripheral : 2+ bilateral upper extremity pulses, 2+ bilateral femoral pulses, 2+ bilateral dorsal pedal pulse Neuro: Alert and oriented. No facial asymmetry. No focal deficit. Moves all extremities spontaneously. Musculoskeletal: Normal muscle tone without kyphosis Psych:  Responds to questions appropriately with a normal affect.    Assessment: 74 year old female with known aortic valve stenosis 6 sinus syndrome and atrial fibrillation status post previous pacemaker placement with  atrial fibrillation with rapid ventricular rate on appropriate medication management with better heart rate control on amiodarone digoxin having continued recurrent episodes of chest discomfort radiating into her neck of unknown etiology without evidence of myocardial infarction and recent normal cardiac catheterization  Plan: 1. Continue digoxin and amiodarone for maintenance of normal sinus rhythm and/or heart rate control of atrial fibrillation 2. Continue telemetry to follow for any further issues of rapid ventricular rate of atrial fibrillation needing adjustments of dosage of medication management and would consider increasing digoxin dose if necessary 3. Continue anticoagulation for further risk reduction in stroke with atrial fibrillation 4. No further intervention of chest discomfort with normal cardiac catheterization 5. No further cardiac diagnostics necessary at this time 6. Possible discharge to home if ambulating well with follow-up next week  Signed, Corey Skains M.D. [facility] 12/12/2014, 12:28 PM

## 2014-12-12 NOTE — ED Notes (Signed)
Patient reporting itching all over, anxious.

## 2014-12-12 NOTE — Clinical Social Work Note (Signed)
Patient is to discharge back to [facility] today after being admitted under observation just a few hours prior. CSW contacted Anderson Malta at [facility] this morning and informed her that patient may return and she stated that this would be fine. CSW manually faxed the discharge summary to [facility]. Patient's nurse to call daughter to inform as daughter was anticipating she might discharge. Family had stated to nursing that they wished for patient to transport via EMS. As patient was only here a few hours, no FL2 required but nurse will still call report. Shela Leff MSW,LCSW [phone]

## 2014-12-12 NOTE — Discharge Instructions (Signed)
Follow-up with primary care physician Dr. Rosanna Randy in 3 days next and follow-up with cardiology Dr. Nehemiah Massed in a week Follow-up with outpatient cardiac rehabilitation in a week   activity as tolerated Diet cardiac

## 2014-12-12 NOTE — ED Provider Notes (Signed)
[facility] Emergency Department Provider Note  ____________________________________________  Time seen: 5:00 AM  I have reviewed the triage vital signs and the nursing notes.   HISTORY  Chief Complaint Chest Pain      HPI Ruth Hodges is a 74 y.o. female presents with acute onset last night of 8 out of 10 central chest pain with radiation to the bilateral shoulders. Patient denies any dyspnea no dizziness no diaphoresis. Of note patient received 3 doses of nitroglycerin sublingual at her home [place] facility prior to presentation to the emergency department.   Past Medical History  Diagnosis Date  . Anxiety   . Depression   . A-fib   . Arthritis   . Hypertension   . Hyperlipidemia   . Thyroid disease   . CAD (coronary artery disease)     Patient Active Problem List   Diagnosis Date Noted  . Acute on chronic combined systolic and diastolic heart failure 83/41/9622  . Troponin level elevated 12/01/2014  . Streptococcus viridans infection 12/01/2014  . Oxygen desaturation 12/01/2014  . Atrial fibrillation 11/23/2014  . Contact dermatitis 11/18/2014  . NSTEMI (non-ST elevated myocardial infarction) 10/15/2014  . Unstable angina 10/14/2014  . Allergic rhinitis 09/05/2014  . Arthritis 09/05/2014  . A-fib 09/05/2014  . 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 Arteriosclerosis of coronary artery 09/05/2014  . Anxiety and depression 09/05/2014  . Essential (primary) hypertension 09/05/2014  . Fibrositis 09/05/2014  . H/O acute myocardial infarction 09/05/2014  . DD (diverticular disease) 09/05/2014  . HLD (hyperlipidemia) 09/05/2014  . Adult hypothyroidism 09/05/2014  . 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 Mild cognitive disorder 09/05/2014  . Carotid arterial disease 09/05/2014  . Left ventricular hypertrophy 09/05/2014  . Aortic heart valve narrowing 09/05/2014  . Artificial cardiac pacemaker  07/21/2014    Past Surgical History  Procedure Laterality Date  . Pacemaker insertion    . Cardiac catheterization    . Bladder surgery    . Cholecystectomy    . Appendectomy    . Coronary artery bypass graft    . Vaginal hysterectomy  1972    total, ovaries removed in 1983  . Cardiac catheterization N/A 11/27/2014    Procedure: Left Heart Cath and Cors/Grafts Angiography;  Surgeon: Teodoro Spray, MD;  Location: [place] [facility];  Service: Cardiovascular;  Laterality: N/A;    Current Outpatient Rx  Name  Route  Sig  Dispense  Refill  . amiodarone (PACERONE) 400 MG tablet   Oral   Take 1 tablet (400 mg total) by mouth daily. Patient taking differently: Take 200 mg by mouth daily.    30 tablet   0   . amitriptyline (ELAVIL) 75 MG tablet   Oral   Take 75 mg by mouth at bedtime.         Marland Kitchen apixaban (ELIQUIS) 5 MG TABS tablet   Oral   Take 5 mg by mouth 2 (two) times daily.          . Biotin 2500 MCG CAPS   Oral   Take 5,000 mcg by mouth daily.          . Cholecalciferol (VITAMIN D3) 1000 UNITS CAPS   Oral   Take 1 capsule by mouth daily.          . cyanocobalamin 100 MCG tablet   Oral   Take 100 mcg by mouth daily.          . digoxin (LANOXIN)  0.125 MG tablet   Oral   Take 1 tablet (0.125 mg total) by mouth daily.   30 tablet   0   . felodipine (PLENDIL) 10 MG 24 hr tablet   Oral   Take 10 mg by mouth daily.          . ferrous sulfate 325 (65 FE) MG tablet   Oral   Take 325 mg by mouth daily.         . fluocinonide cream (LIDEX) 0.05 %   Topical   Apply 1 application topically 3 (three) times daily. To rash except face   15 g   0   . isosorbide mononitrate (IMDUR) 30 MG 24 hr tablet   Oral   Take 30 mg by mouth daily.         Marland Kitchen losartan (COZAAR) 100 MG tablet   Oral   Take 100 mg by mouth daily.          . mirtazapine (REMERON) 7.5 MG tablet   Oral   Take 7.5 mg by mouth at bedtime.         . Omega-3 Fatty Acids (FISH  OIL) 1000 MG CAPS   Oral   Take 1,000 mg by mouth daily.         Marland Kitchen omeprazole (PRILOSEC) 20 MG capsule   Oral   Take 20 mg by mouth daily.         . polyethylene glycol (MIRALAX / GLYCOLAX) packet   Oral   Take 17 g by mouth daily.         . simvastatin (ZOCOR) 20 MG tablet   Oral   Take 20 mg by mouth daily.         Marland Kitchen torsemide (DEMADEX) 20 MG tablet   Oral   Take 1 tablet (20 mg total) by mouth daily.   30 tablet   0   . traMADol (ULTRAM) 50 MG tablet   Oral   Take 1 tablet (50 mg total) by mouth 3 (three) times daily as needed.   30 tablet   0   . vitamin C (ASCORBIC ACID) 250 MG tablet   Oral   Take 250 mg by mouth daily.         Marland Kitchen alum & mag hydroxide-simeth (MAALOX/MYLANTA) 200-200-20 MG/5ML suspension   Oral   Take 30 mLs by mouth every 6 (six) hours as needed for indigestion or heartburn (dyspepsia).   355 mL   0   . levothyroxine (SYNTHROID, LEVOTHROID) 150 MCG tablet   Oral   Take 150 mcg by mouth daily.          . meclizine (ANTIVERT) 12.5 MG tablet   Oral   Take 1 tablet (12.5 mg total) by mouth 2 (two) times daily as needed for dizziness.   30 tablet   0   . nitroGLYCERIN (NITROSTAT) 0.4 MG SL tablet   Sublingual   Place 0.4 mg under the tongue.           Allergies Codeine and Ceftin   Family History  Problem Relation Age of Onset  . Heart attack Father   . CVA Sister   . Diabetes Brother   . Heart attack Brother     Social History Social History  Substance Use Topics  . Smoking status: Never Smoker   . Smokeless tobacco: None  . Alcohol Use: No    Review of Systems  Constitutional: Negative for fever. Eyes: Negative for visual changes. ENT: Negative  for sore throat. Cardiovascular: Positive for chest pain. Respiratory: Negative for shortness of breath. Gastrointestinal: Negative for abdominal pain, vomiting and diarrhea. Genitourinary: Negative for dysuria. Musculoskeletal: Negative for back pain. Skin:  Negative for rash. Neurological: Negative for headaches, focal weakness or numbness.   10-point ROS otherwise negative.  ____________________________________________   PHYSICAL EXAM:  VITAL SIGNS: ED Triage Vitals  Enc Vitals Group     BP 12/12/14 0459 143/58 mmHg     Pulse Rate 12/12/14 0459 92     Resp 12/12/14 0459 20     Temp 12/12/14 0459 98 F (36.7 C)     Temp src --      SpO2 12/12/14 0459 95 %     Weight 12/12/14 0459 168 lb (76.204 kg)     Height 12/12/14 0459 5\' 4"  (1.626 m)     Head Cir --      Peak Flow --      Pain Score 12/12/14 0501 7     Pain Loc --      Pain Edu? --      Excl. in [place]? --      Constitutional: Alert and oriented. Well appearing and in no distress. Eyes: Conjunctivae are normal. PERRL. Normal extraocular movements. ENT   Head: Normocephalic and atraumatic.   Nose: No congestion/rhinnorhea.   Mouth/Throat: Mucous membranes are moist.   Neck: No stridor. Hematological/Lymphatic/Immunilogical: No cervical lymphadenopathy. Cardiovascular: Normal rate, regular rhythm. Normal and symmetric distal pulses are present in all extremities. Grade 3 systolic ejection murmur Respiratory: Normal respiratory effort without tachypnea nor retractions. Breath sounds are clear and equal bilaterally. No wheezes/rales/rhonchi. Gastrointestinal: Soft and nontender. No distention. There is no CVA tenderness. Genitourinary: deferred Musculoskeletal: Nontender with normal range of motion in all extremities. No joint effusions.  No lower extremity tenderness nor edema. Neurologic:  Normal speech and language. No gross focal neurologic deficits are appreciated. Speech is normal.  Skin:  Skin is warm, dry and intact. No rash noted. Psychiatric: Mood and affect are normal. Speech and behavior are normal. Patient exhibits appropriate insight and judgment.  ____________________________________________    LABS (pertinent positives/negatives)  Labs  Reviewed  BASIC METABOLIC PANEL - Abnormal; Notable for the following:    Potassium 3.4 (*)    GFR calc non Af Amer 57 (*)    All other components within normal limits  CBC - Abnormal; Notable for the following:    RBC 3.63 (*)    Hemoglobin 9.7 (*)    HCT 30.5 (*)    MCHC 31.7 (*)    RDW 17.7 (*)    All other components within normal limits  TROPONIN I - Abnormal; Notable for the following:    Troponin I 0.04 (*)    All other components within normal limits     ____________________________________________   EKG  ED ECG REPORT I, BROWN, [place] N, the attending physician, personally viewed and interpreted this ECG.   Date: 12/12/2014  EKG Time: 5:03AM  Rate: 97  Rhythm: Atrial fibrillation  Axis: none  Intervals:Normal  ST&T Change: none    ____________________________________________    RADIOLOGY     DG Chest 2 View (Final result) Result time: 12/12/14 05:43:09   Final result by Rad Results In Interface (12/12/14 05:43:09)   Narrative:   CLINICAL DATA: Mid chest pain and shortness of breath this morning.  EXAM: CHEST 2 VIEW  COMPARISON: 12/01/2014  FINDINGS: Dual lead left-sided pacemaker remains in place. The patient is post median sternotomy. The cardiomediastinal contours  are unchanged with mild cardiomegaly. Improved aeration in the left midlung zone, no new consolidation. No pulmonary edema, pleural effusion or pneumothorax. Osseous structures are intact.  IMPRESSION: No acute pulmonary process. Improved aeration of the left midlung zone from prior.   Electronically Signed By: Jeb Levering M.D. On: 12/12/2014 05:43    Critical care 30 minutes   INITIAL IMPRESSION / ASSESSMENT AND PLAN / ED COURSE  Pertinent labs & imaging results that were available during my care of the patient were reviewed by me and considered in my medical decision making (see chart for details).  Patient with ongoing chest pain with positive  troponin 0.04 currently taking Eliquis at home. Concern for NSTEMI as such patient will be admitted to the hospital for further management. Patient discussed with Dr. Reece Levy for hospital admission.  ____________________________________________   FINAL CLINICAL IMPRESSION(S) / ED DIAGNOSES  Final diagnoses:  Chest pain, unspecified chest pain type  NSTEMI (non-ST elevated myocardial infarction)      Gregor Hams, MD 12/12/14 [phone]

## 2014-12-12 NOTE — Progress Notes (Addendum)
[facility] d[facility]c'd to [facility], report given to [place]. Education provided, no questions at this time. Patient to be picked up by EMS, daughter and son in law notified. Telemetry removed. Ruth Hodges

## 2014-12-12 NOTE — H&P (Signed)
[facility] NAME: Ruth Hodges    MR#:  [id number]  DATE OF BIRTH:  May 30, 1940  DATE OF ADMISSION:  12/12/2014  PRIMARY CARE PHYSICIAN: Wilhemena Durie, MD   REQUESTING/REFERRING PHYSICIAN: Dr. Owens Shark  CHIEF COMPLAINT:   Chest pain HISTORY OF PRESENT ILLNESS:  Ruth Hodges  is a 74 y.o. female with a known history of coronary artery disease, chronic atrial fibrillation, depression, arthritis, hypertension hyperlipidemia was just recently admitted to the hospital with the similar complaint of chest pain on August 8 and was seen by Dr. Nehemiah Massed at the time. Today patient is brought in from the nursing home with a chief complaint of chest pain started last night 8 out of 10 with radiation to the back and bilateral shoulders. patient received 3 doses of nitroglycerin sublingual at her home [place] facility prior to presentation to the emergency department. During my examination patient's chest pain is much better and denies any shortness of breath. Just seen by cardiology last week   PAST MEDICAL HISTORY:   Past Medical History  Diagnosis Date  . Anxiety   . Depression   . A-fib   . Arthritis   . Hypertension   . Hyperlipidemia   . Thyroid disease   . CAD (coronary artery disease)     PAST SURGICAL HISTOIRY:   Past Surgical History  Procedure Laterality Date  . Pacemaker insertion    . Cardiac catheterization    . Bladder surgery    . Cholecystectomy    . Appendectomy    . Coronary artery bypass graft    . Vaginal hysterectomy  1972    total, ovaries removed in 1983  . Cardiac catheterization N/A 11/27/2014    Procedure: Left Heart Cath and Cors/Grafts Angiography;  Surgeon: Teodoro Spray, MD;  Location: [facility];  Service: Cardiovascular;  Laterality: N/A;    SOCIAL HISTORY:   Social History  Substance Use Topics  . Smoking status: Never Smoker   . Smokeless tobacco: Not on  file  . Alcohol Use: No    FAMILY HISTORY:   Family History  Problem Relation Age of Onset  . Heart attack Father   . CVA Sister   . Diabetes Brother   . Heart attack Brother     DRUG ALLERGIES:   Allergies  Allergen Reactions  . Codeine Other (See Comments)    Hallucination  . Ceftin  [Cefuroxime Axetil] Other (See Comments)    REVIEW OF SYSTEMS:  CONSTITUTIONAL: No fever, fatigue or weakness.  EYES: No blurred or double vision.  EARS, NOSE, AND THROAT: No tinnitus or ear pain.  RESPIRATORY: No cough, shortness of breath, wheezing or hemoptysis.  CARDIOVASCULAR: Reporting some chest pain, and denies palpitations orthopnea, edema.  GASTROINTESTINAL: No nausea, vomiting, diarrhea or abdominal pain.  GENITOURINARY: No dysuria, hematuria.  ENDOCRINE: No polyuria, nocturia,  HEMATOLOGY: No anemia, easy bruising or bleeding SKIN: No rash or lesion. MUSCULOSKELETAL: No joint pain or arthritis.   NEUROLOGIC: No tingling, numbness, weakness.  PSYCHIATRY: No anxiety or depression.   MEDICATIONS AT HOME:   Prior to Admission medications   Medication Sig Start Date End Date Taking? Authorizing Provider  amiodarone (PACERONE) 400 MG tablet Take 1 tablet (400 mg total) by mouth daily. Patient taking differently: Take 200 mg by mouth daily.  12/01/14  Yes Srikar Sudini, MD  amitriptyline (ELAVIL) 75 MG tablet Take 75 mg by mouth at bedtime.  Yes Historical Provider, MD  apixaban (ELIQUIS) 5 MG TABS tablet Take 5 mg by mouth 2 (two) times daily.  08/31/14  Yes Historical Provider, MD  Biotin 2500 MCG CAPS Take 5,000 mcg by mouth daily.  08/31/14  Yes Historical Provider, MD  Cholecalciferol (VITAMIN D3) 1000 UNITS CAPS Take 1 capsule by mouth daily.  08/31/14  Yes Historical Provider, MD  cyanocobalamin 100 MCG tablet Take 100 mcg by mouth daily.  08/31/14  Yes Historical Provider, MD  digoxin (LANOXIN) 0.125 MG tablet Take 1 tablet (0.125 mg total) by mouth daily. 12/02/14  Yes Gladstone Lighter, MD  felodipine (PLENDIL) 10 MG 24 hr tablet Take 10 mg by mouth daily.  08/31/14  Yes Historical Provider, MD  ferrous sulfate 325 (65 FE) MG tablet Take 325 mg by mouth daily.   Yes Historical Provider, MD  fluocinonide cream (LIDEX) 9.56 % Apply 1 application topically 3 (three) times daily. To rash except face 11/18/14  Yes Carmon Ginsberg, PA  isosorbide mononitrate (IMDUR) 30 MG 24 hr tablet Take 30 mg by mouth daily. 10/27/14 10/27/15 Yes Historical Provider, MD  losartan (COZAAR) 100 MG tablet Take 100 mg by mouth daily.  04/29/14  Yes Historical Provider, MD  mirtazapine (REMERON) 7.5 MG tablet Take 7.5 mg by mouth at bedtime.   Yes Historical Provider, MD  Omega-3 Fatty Acids (FISH OIL) 1000 MG CAPS Take 1,000 mg by mouth daily.   Yes Historical Provider, MD  omeprazole (PRILOSEC) 20 MG capsule Take 20 mg by mouth daily.   Yes Historical Provider, MD  polyethylene glycol (MIRALAX / GLYCOLAX) packet Take 17 g by mouth daily.   Yes Historical Provider, MD  simvastatin (ZOCOR) 20 MG tablet Take 20 mg by mouth daily.   Yes Historical Provider, MD  torsemide (DEMADEX) 20 MG tablet Take 1 tablet (20 mg total) by mouth daily. 12/02/14  Yes Gladstone Lighter, MD  traMADol (ULTRAM) 50 MG tablet Take 1 tablet (50 mg total) by mouth 3 (three) times daily as needed. 12/02/14  Yes Gladstone Lighter, MD  vitamin C (ASCORBIC ACID) 250 MG tablet Take 250 mg by mouth daily.   Yes Historical Provider, MD  alum & mag hydroxide-simeth (MAALOX/MYLANTA) 200-200-20 MG/5ML suspension Take 30 mLs by mouth every 6 (six) hours as needed for indigestion or heartburn (dyspepsia). 11/27/14   Teodoro Spray, MD  Influenza vac split quadrivalent PF (FLUARIX) 0.5 ML injection Inject 0.5 mLs into the muscle tomorrow at 10 am. 12/13/14   Nicholes Mango, MD  levothyroxine (SYNTHROID, LEVOTHROID) 150 MCG tablet Take 150 mcg by mouth daily.  04/29/14   Historical Provider, MD  meclizine (ANTIVERT) 12.5 MG tablet Take 1 tablet (12.5  mg total) by mouth 2 (two) times daily as needed for dizziness. 12/02/14   Gladstone Lighter, MD  metoprolol tartrate (LOPRESSOR) 25 MG tablet Take 0.5 tablets (12.5 mg total) by mouth 2 (two) times daily. 12/12/14   Nicholes Mango, MD  nitroGLYCERIN (NITROSTAT) 0.4 MG SL tablet Place 0.4 mg under the tongue. 10/27/14 10/27/15  Historical Provider, MD  ondansetron (ZOFRAN) 4 MG tablet Take 1 tablet (4 mg total) by mouth every 6 (six) hours as needed for nausea. 12/12/14   Nicholes Mango, MD  polyethylene glycol (MIRALAX / GLYCOLAX) packet Take 17 g by mouth daily. 12/12/14   Nicholes Mango, MD      VITAL SIGNS:  Blood pressure 141/55, pulse 62, temperature 98.2 F (36.8 C), temperature source Oral, resp. rate 20, height 5\' 4"  (1.626 m), weight  76.204 kg (168 lb), SpO2 96 %.  PHYSICAL EXAMINATION:  GENERAL:  74 y.o.-year-old patient lying in the bed with no acute distress.  EYES: Pupils equal, round, reactive to light and accommodation. No scleral icterus. Extraocular muscles intact.  HEENT: Head atraumatic, normocephalic. Oropharynx and nasopharynx clear.  NECK:  Supple, no jugular venous distention. No thyroid enlargement, no tenderness.  LUNGS: Normal breath sounds bilaterally, no wheezing, rales,rhonchi or crepitation. No use of accessory muscles of respiration.  CARDIOVASCULAR: Irregularly irregular. No murmurs, rubs, or gallops.  ABDOMEN: Soft, nontender, nondistended. Bowel sounds present. No organomegaly or mass.  EXTREMITIES: No pedal edema, cyanosis, or clubbing.  NEUROLOGIC: Cranial nerves II through XII are intact. Muscle strength 5/5 in all extremities. Sensation intact. Gait not checked.  PSYCHIATRIC: The patient is alert and oriented x 3.  SKIN: No obvious rash, lesion, or ulcer.   LABORATORY PANEL:   CBC  Recent Labs Lab 12/12/14 0515  WBC 7.8  HGB 9.7*  HCT 30.5*  PLT 218    ------------------------------------------------------------------------------------------------------------------  Chemistries   Recent Labs Lab 12/12/14 0515  NA 142  K 3.4*  CL 109  CO2 23  GLUCOSE 91  BUN 14  CREATININE 0.95  CALCIUM 9.0   ------------------------------------------------------------------------------------------------------------------  Cardiac Enzymes  Recent Labs Lab 12/12/14 1147  TROPONINI 0.05*   ------------------------------------------------------------------------------------------------------------------  RADIOLOGY:  Dg Chest 2 View  12/12/2014   CLINICAL DATA:  Mid chest pain and shortness of breath this morning.  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 The patient is post median sternotomy. The cardiomediastinal contours are unchanged with mild cardiomegaly. Improved aeration in the left midlung zone, no new consolidation. No pulmonary edema, pleural effusion or pneumothorax. Osseous structures are intact.  IMPRESSION: No acute pulmonary process. Improved aeration of the left midlung zone from prior.   Electronically Signed   By: Jeb Levering M.D.   On: 12/12/2014 05:43    EKG:   Orders placed or performed during the hospital encounter of 12/12/14  . EKG 12-Lead  . EKG 12-Lead  . ED EKG within 10 minutes  . ED EKG within 10 minutes    IMPRESSION AND PLAN:   74 year old female [facility] with a chief complaint of chest pain started from last night radiating to the upper back and bilateral shoulders  1. Chest pain with a history of coronary artery disease Telemetry OBSERVATION status Provide oxygen, nitroglycerin as needed, aspirin, statin Cycle cardiac biomarkers Cardiology consult is placed to Dr. Nehemiah Massed  2. History of hypertension Blood pressure is well controlled. Continue her home  medications. Not sure why Patient is not on beta blocker, will discuss with cardiology  3. Chronic history of atrial fibrillation-rate controlled We'll continue home medication digoxin, amiodarone and Eliquis  4. Chronic history of fibromyalgia  Continue tramadol as needed   5. History of hyperlipidemia continue statin     All the records are reviewed and case discussed with ED provider. Management plans discussed with the patient, family and they are in agreement.  CODE STATUS: full, daughter is POA  TOTAL TIME TAKING CARE OF THIS PATIENT: 45 minutes.    Nicholes Mango M.D on 12/12/2014 at 1:53 PM  Between 7am to 6pm - Pager - [phone]  After 6pm go to [email or web address] - password [email or web address] [facility]  Office  [phone]  CC: Primary care physician; Wilhemena Durie, MD

## 2014-12-22 ENCOUNTER — Telehealth: Payer: Self-pay | Admitting: Emergency Medicine

## 2014-12-22 NOTE — Telephone Encounter (Signed)
Lora from [facility] called wanting verbal orders for PT twice weekly for 8 weeks and skilled nursing to come out to her home for medication management. Because pt is getting her medications confused.   CB # [phone]

## 2014-12-25 ENCOUNTER — Telehealth: Payer: Self-pay | Admitting: Family Medicine

## 2014-12-25 ENCOUNTER — Encounter: Payer: Self-pay | Admitting: Cardiology

## 2014-12-25 NOTE — Telephone Encounter (Signed)
Please review-aa 

## 2014-12-25 NOTE — Telephone Encounter (Signed)
Ester would like to get verbal orders for OT as follows: Twice a week for 1 week Than no visit for a week b/c she will be on vacation Twice a week for 1 week.  Thanks TNP

## 2014-12-28 NOTE — Telephone Encounter (Signed)
Ruth Hodges from [facility] per Dr. Rosanna Randy patient needs follow up appt, she has not showed up for her last appt after hospital stay. Dr. Rosanna Randy approved 1 week worth of OT. Patient needs appt-aa

## 2014-12-31 ENCOUNTER — Ambulatory Visit
Admission: RE | Admit: 2014-12-31 | Discharge: 2014-12-31 | Disposition: A | Discharge status: Home / Self Care | Payer: Medicare Other | Source: Ambulatory Visit | Attending: Family Medicine | Admitting: Family Medicine

## 2014-12-31 ENCOUNTER — Ambulatory Visit (INDEPENDENT_AMBULATORY_CARE_PROVIDER_SITE_OTHER): Payer: Medicare Other | Admitting: Family Medicine

## 2014-12-31 ENCOUNTER — Encounter: Payer: Self-pay | Admitting: Family Medicine

## 2014-12-31 VITALS — BP 126/52 | HR 60 | Temp 97.8°F | Resp 12 | Wt 162.0 lb

## 2014-12-31 DIAGNOSIS — M25511 Pain in right shoulder: Secondary | ICD-10-CM

## 2014-12-31 DIAGNOSIS — F32A Depression, unspecified: Secondary | ICD-10-CM

## 2014-12-31 DIAGNOSIS — F329 Major depressive disorder, single episode, unspecified: Secondary | ICD-10-CM | POA: Diagnosis not present

## 2014-12-31 MED ORDER — VENLAFAXINE HCL ER 37.5 MG PO CP24: 37.5000 mg | ORAL_CAPSULE | Freq: Every day | ORAL | Status: AC

## 2014-12-31 MED ORDER — MIRTAZAPINE 15 MG PO TABS: 15.0000 mg | ORAL_TABLET | Freq: Every day | ORAL | Status: AC

## 2014-12-31 NOTE — Progress Notes (Signed)
Patient ID: Ruth Hodges, female   DOB: [birth year], 74 y.o.   MRN: [id number]    Subjective:  HPI  Patient is here for hospital follow up:  Patient states she has been at [facility] several times since her last visit in July. 1 visit was for dizziness, 2nd was when she fell in her kitchen and hit her head and 3rd was August 27th for chest pain. She was given a flu shot at [facility] per records. She was put on Metoprolol and Zofran. Can not verify all of her medications today due to patient does not have them. She has not followed up with cardiologist yet. She has been going to rehab at [facility]. Patient is no longer having chest pain. Pt has had at least 3 ED visits and has not kept appts here. If pt is compliant I do think she can feel better although most of her issues are chronic diseases.     Prior to Admission medications   Medication Sig Start Date End Date Taking? Authorizing Provider  alum & mag hydroxide-simeth (MAALOX/MYLANTA) 200-200-20 MG/5ML suspension Take 30 mLs by mouth every 6 (six) hours as needed for indigestion or heartburn (dyspepsia). 11/27/14  Yes Teodoro Spray, MD  amiodarone (PACERONE) 400 MG tablet Take 1 tablet (400 mg total) by mouth daily. Patient taking differently: Take 200 mg by mouth daily.  12/01/14  Yes Srikar Sudini, MD  amitriptyline (ELAVIL) 75 MG tablet Take 75 mg by mouth at bedtime.   Yes Historical Provider, MD  apixaban (ELIQUIS) 5 MG TABS tablet Take 5 mg by mouth 2 (two) times daily.  08/31/14  Yes Historical Provider, MD  Biotin 2500 MCG CAPS Take 5,000 mcg by mouth daily.  08/31/14  Yes Historical Provider, MD  Cholecalciferol (VITAMIN D3) 1000 UNITS CAPS Take 1 capsule by mouth daily.  08/31/14  Yes Historical Provider, MD  cyanocobalamin 100 MCG tablet Take 100 mcg by mouth daily.  08/31/14  Yes Historical Provider, MD  digoxin (LANOXIN) 0.125 MG tablet Take 1 tablet (0.125 mg total) by mouth daily. 12/02/14  Yes Gladstone Lighter, MD  felodipine  (PLENDIL) 10 MG 24 hr tablet Take 10 mg by mouth daily.  08/31/14  Yes Historical Provider, MD  ferrous sulfate 325 (65 FE) MG tablet Take 325 mg by mouth daily.   Yes Historical Provider, MD  fluocinonide cream (LIDEX) 5.62 % Apply 1 application topically 3 (three) times daily. To rash except face 11/18/14  Yes Carmon Ginsberg, PA  Influenza vac split quadrivalent PF (FLUARIX) 0.5 ML injection Inject 0.5 mLs into the muscle tomorrow at 10 am. 12/13/14  Yes Nicholes Mango, MD  isosorbide mononitrate (IMDUR) 30 MG 24 hr tablet Take 30 mg by mouth daily. 10/27/14 10/27/15 Yes Historical Provider, MD  levothyroxine (SYNTHROID, LEVOTHROID) 150 MCG tablet Take 150 mcg by mouth daily.  04/29/14  Yes Historical Provider, MD  losartan (COZAAR) 100 MG tablet Take 100 mg by mouth daily.  04/29/14  Yes Historical Provider, MD  meclizine (ANTIVERT) 12.5 MG tablet Take 1 tablet (12.5 mg total) by mouth 2 (two) times daily as needed for dizziness. 12/02/14  Yes Gladstone Lighter, MD  metoprolol tartrate (LOPRESSOR) 25 MG tablet Take 0.5 tablets (12.5 mg total) by mouth 2 (two) times daily. 12/12/14  Yes Nicholes Mango, MD  mirtazapine (REMERON) 7.5 MG tablet Take 7.5 mg by mouth at bedtime.   Yes Historical Provider, MD  nitroGLYCERIN (NITROSTAT) 0.4 MG SL tablet Place 0.4 mg under the tongue. 10/27/14 10/27/15 Yes  Historical Provider, MD  Omega-3 Fatty Acids (FISH OIL) 1000 MG CAPS Take 1,000 mg by mouth daily.   Yes Historical Provider, MD  omeprazole (PRILOSEC) 20 MG capsule Take 20 mg by mouth daily.   Yes Historical Provider, MD  ondansetron (ZOFRAN) 4 MG tablet Take 1 tablet (4 mg total) by mouth every 6 (six) hours as needed for nausea. 12/12/14  Yes Nicholes Mango, MD  polyethylene glycol (MIRALAX / GLYCOLAX) packet Take 17 g by mouth daily. 12/12/14  Yes Nicholes Mango, MD  simvastatin (ZOCOR) 20 MG tablet Take 20 mg by mouth daily.   Yes Historical Provider, MD  torsemide (DEMADEX) 20 MG tablet Take 1 tablet (20 mg total) by mouth  daily. 12/02/14  Yes Gladstone Lighter, MD  traMADol (ULTRAM) 50 MG tablet Take 1 tablet (50 mg total) by mouth 3 (three) times daily as needed. 12/02/14  Yes Gladstone Lighter, MD  vitamin C (ASCORBIC ACID) 250 MG tablet Take 250 mg by mouth daily.   Yes Historical Provider, MD    Patient Active Problem List   Diagnosis Date Noted  . Chest pain at rest 12/12/2014  . Acute on chronic combined systolic and diastolic heart failure 14/97/0263  . Troponin level elevated 12/01/2014  . Streptococcus viridans infection 12/01/2014  . Oxygen desaturation 12/01/2014  . Atrial fibrillation 11/23/2014  . Contact dermatitis 11/18/2014  . NSTEMI (non-ST elevated myocardial infarction) 10/15/2014  . Unstable angina 10/14/2014  . Allergic rhinitis 09/05/2014  . Arthritis 09/05/2014  . A-fib 09/05/2014  . 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 Arteriosclerosis of coronary artery 09/05/2014  . Anxiety and depression 09/05/2014  . Essential (primary) hypertension 09/05/2014  . Fibrositis 09/05/2014  . H/O acute myocardial infarction 09/05/2014  . DD (diverticular disease) 09/05/2014  . HLD (hyperlipidemia) 09/05/2014  . Adult hypothyroidism 09/05/2014  . 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 Mild cognitive disorder 09/05/2014  . Carotid arterial disease 09/05/2014  . Left ventricular hypertrophy 09/05/2014  . Aortic heart valve narrowing 09/05/2014  . Artificial cardiac pacemaker 07/21/2014    Past Medical History  Diagnosis Date  . Anxiety   . Depression   . A-fib   . Arthritis   . Hypertension   . Hyperlipidemia   . Thyroid disease   . CAD (coronary artery disease)     Social History   Social History  . Marital Status: Married    Spouse Name: N/A  . Number of Children: N/A  . Years of Education: N/A   Occupational History  . Not on file.   Social History Main Topics  . Smoking status: Never Smoker   . Smokeless tobacco: Never Used  . Alcohol Use: No    . Drug Use: No  . Sexual Activity: No   Other Topics Concern  . Not on file   Social History Narrative    Allergies  Allergen Reactions  . Codeine Other (See Comments)    Hallucination  . Ceftin  [Cefuroxime Axetil] Other (See Comments)    Review of Systems  Constitutional: Positive for malaise/fatigue.  HENT: Negative.   Eyes: Negative.   Respiratory: Negative.   Cardiovascular: Negative.   Genitourinary: Negative.   Musculoskeletal: Positive for joint pain, falls and neck pain.  Skin: Negative.   Neurological: Positive for dizziness.       Unsteady gait  Psychiatric/Behavioral: Positive for depression. The patient is nervous/anxious.     There is no immunization history for the selected administration types on file for this patient.  Objective:  BP 126/52 mmHg  Pulse 60  Temp(Src) 97.8 F (36.6 C)  Resp 12  Wt 162 lb (73.483 kg)  Physical Exam  Constitutional: She is oriented to person, place, and time and well-developed, well-nourished, and in no distress.  HENT:  Head: Normocephalic and atraumatic.  Right Ear: External ear normal.  Left Ear: External ear normal.  Nose: Nose normal.  Eyes: Conjunctivae are normal.  Neck: Neck supple.  Cardiovascular: Normal rate, regular rhythm and normal heart sounds.   Pulmonary/Chest: Effort normal and breath sounds normal.  Abdominal: Soft.  Musculoskeletal: She exhibits tenderness.  Tender over right greater than left anterior shoulders  Neurological: She is alert and oriented to person, place, and time.  Skin: Skin is warm and dry.  Psychiatric: Mood, memory, affect and judgment normal.    Lab Results  Component Value Date   WBC 7.8 12/12/2014   HGB 9.7* 12/12/2014   HCT 30.5* 12/12/2014   PLT 218 12/12/2014   GLUCOSE 91 12/12/2014   TSH 1.227 10/14/2014   INR 1.40 12/01/2014    CMP     Component Value Date/Time   NA 142 12/12/2014 0515   NA 140 11/09/2014 1209   K 3.4* 12/12/2014 0515   CL 109  12/12/2014 0515   CO2 23 12/12/2014 0515   GLUCOSE 91 12/12/2014 0515   GLUCOSE 107* 11/09/2014 1209   BUN 14 12/12/2014 0515   BUN 16 11/09/2014 1209   CREATININE 0.95 12/12/2014 0515   CREATININE 1.1 02/20/2014   CALCIUM 9.0 12/12/2014 0515   PROT 7.5 12/01/2014 2034   PROT 7.2 11/09/2014 1209   ALBUMIN 3.3* 12/01/2014 2034   AST 39 12/01/2014 2034   ALT 28 12/01/2014 2034   ALKPHOS 164* 12/01/2014 2034   BILITOT 0.4 12/01/2014 2034   BILITOT 0.4 11/09/2014 1209   GFRNONAA 57* 12/12/2014 0515   GFRAA >60 12/12/2014 0515    Assessment and Plan :  1. Right shoulder pain Bilateral shoulder pain--right greater than left. May need ortjo referral. - DG Shoulder Right; Future  2. Depression Major issue for this pt--may need Psychiatric referral.Not dealing with illness and husband with dementia. Start Effexor 75mg  daily (daughter on this). iNCREASE rEMERON TO 15 FOR MORE HELP WITH ANXIETY AND DEPRESSION. - venlafaxine XR (EFFEXOR-XR) 37.5 MG 24 hr capsule; Take 1 capsule (37.5 mg total) by mouth daily with breakfast.  Dispense: 30 capsule; Refill: 5 - mirtazapine (REMERON) 15 MG tablet; Take 1 tablet (15 mg total) by mouth at bedtime.  Dispense: 30 tablet; Refill: 5 3.CAD- Recent cath shows diffuse disease including bypas grafts.All risk factors treated.Lengthy visit with pt and a lot of time spent reviewing her multiple hospital visits and procedures. 4HTN 5.HLD 6Fibromyalgia Pt thinks this is causing her arm pain.    Miguel Aschoff MD [facility] 12/31/2014 2:03 PM

## 2015-01-04 NOTE — Telephone Encounter (Signed)
-----   Message from Jerrol Banana., MD sent at 01/01/2015  2:59 PM EDT ----- X-ray of shoulder okay. Keep appointment with orthopedics.

## 2015-01-05 ENCOUNTER — Telehealth: Payer: Self-pay

## 2015-01-05 NOTE — Telephone Encounter (Signed)
Tried calling patient, no answer. Will try again later.  

## 2015-01-05 NOTE — Telephone Encounter (Signed)
-----   Message from Jerrol Banana., MD sent at 01/01/2015  2:59 PM EDT ----- X-ray of shoulder okay. Keep appointment with orthopedics.

## 2015-01-06 NOTE — Telephone Encounter (Signed)
No answer-aa 

## 2015-01-08 ENCOUNTER — Other Ambulatory Visit: Payer: Self-pay | Admitting: Family Medicine

## 2015-01-08 DIAGNOSIS — M1990 Unspecified osteoarthritis, unspecified site: Secondary | ICD-10-CM

## 2015-01-08 MED ORDER — TRAMADOL HCL 50 MG PO TABS: 50.0000 mg | ORAL_TABLET | Freq: Three times a day (TID) | ORAL | Status: AC | PRN

## 2015-01-08 NOTE — Telephone Encounter (Signed)
Pt contacted office for refill request on the following medications:  traMADol (ULTRAM) 50 MG.  [place].  [phone]  This is a pt of Dr Rosanna Randy.  Pt is requesting this Rx sent today if possible.  Pt states she only has 3 pills left and will be out after tomorrow morning/MW

## 2015-01-11 ENCOUNTER — Encounter: Payer: Self-pay | Admitting: *Deleted

## 2015-01-11 ENCOUNTER — Emergency Department
Admission: EM | Admit: 2015-01-11 | Discharge: 2015-01-12 | Disposition: A | Discharge status: Other Acute Inpatient Hospital | Payer: Medicare Other | Attending: Emergency Medicine | Admitting: Emergency Medicine

## 2015-01-11 DIAGNOSIS — I6201 Nontraumatic acute subdural hemorrhage: Secondary | ICD-10-CM | POA: Insufficient documentation

## 2015-01-11 DIAGNOSIS — S065X9A Traumatic subdural hemorrhage with loss of consciousness of unspecified duration, initial encounter: Secondary | ICD-10-CM

## 2015-01-11 DIAGNOSIS — Z79899 Other long term (current) drug therapy: Secondary | ICD-10-CM | POA: Insufficient documentation

## 2015-01-11 DIAGNOSIS — R51 Headache: Secondary | ICD-10-CM | POA: Diagnosis present

## 2015-01-11 DIAGNOSIS — Z7901 Long term (current) use of anticoagulants: Secondary | ICD-10-CM | POA: Insufficient documentation

## 2015-01-11 DIAGNOSIS — I10 Essential (primary) hypertension: Secondary | ICD-10-CM | POA: Diagnosis not present

## 2015-01-11 DIAGNOSIS — S065XAA Traumatic subdural hemorrhage with loss of consciousness status unknown, initial encounter: Secondary | ICD-10-CM

## 2015-01-11 MED ORDER — ONDANSETRON HCL 4 MG/2ML IJ SOLN
4.0000 mg | Freq: Once | INTRAMUSCULAR | Status: AC
  Administered 2015-01-11: 4 mg via INTRAVENOUS

## 2015-01-11 MED ORDER — MORPHINE SULFATE (PF) 4 MG/ML IV SOLN
INTRAVENOUS | Status: AC
  Administered 2015-01-11: 4 mg via INTRAVENOUS
  Filled 2015-01-11: qty 1

## 2015-01-11 MED ORDER — MORPHINE SULFATE (PF) 4 MG/ML IV SOLN
4.0000 mg | Freq: Once | INTRAVENOUS | Status: AC
  Administered 2015-01-11: 4 mg via INTRAVENOUS

## 2015-01-11 MED ORDER — ONDANSETRON HCL 4 MG/2ML IJ SOLN
INTRAMUSCULAR | Status: AC
  Administered 2015-01-11: 4 mg via INTRAVENOUS
  Filled 2015-01-11: qty 2

## 2015-01-11 NOTE — ED Notes (Signed)
Pt arrives via EMS from the [facility]. She has had a headache for the better part of the day, associated with nausea. Pain concentrated over the left eye. She has taken Tramadol x2 and Tylenol without relief. En route, pressure 183/106, otherwise normal. Stroke screen negative.

## 2015-01-12 ENCOUNTER — Inpatient Hospital Stay (HOSPITAL_COMMUNITY)
Admission: AD | Admit: 2015-01-12 | Discharge: 2015-02-16 | DRG: 064 | Disposition: E | Payer: Medicare Other | Source: Other Acute Inpatient Hospital | Attending: Internal Medicine | Admitting: Internal Medicine

## 2015-01-12 ENCOUNTER — Inpatient Hospital Stay (HOSPITAL_COMMUNITY): Payer: Medicare Other

## 2015-01-12 ENCOUNTER — Emergency Department: Payer: Medicare Other

## 2015-01-12 ENCOUNTER — Other Ambulatory Visit (HOSPITAL_COMMUNITY): Payer: BLUE CROSS/BLUE SHIELD

## 2015-01-12 DIAGNOSIS — S065XAA Traumatic subdural hemorrhage with loss of consciousness status unknown, initial encounter: Secondary | ICD-10-CM

## 2015-01-12 DIAGNOSIS — I4891 Unspecified atrial fibrillation: Secondary | ICD-10-CM | POA: Diagnosis present

## 2015-01-12 DIAGNOSIS — R40243 Glasgow coma scale score 3-8: Secondary | ICD-10-CM | POA: Diagnosis not present

## 2015-01-12 DIAGNOSIS — E039 Hypothyroidism, unspecified: Secondary | ICD-10-CM | POA: Diagnosis present

## 2015-01-12 DIAGNOSIS — I6201 Nontraumatic acute subdural hemorrhage: Secondary | ICD-10-CM | POA: Diagnosis present

## 2015-01-12 DIAGNOSIS — Z8249 Family history of ischemic heart disease and other diseases of the circulatory system: Secondary | ICD-10-CM

## 2015-01-12 DIAGNOSIS — I2510 Atherosclerotic heart disease of native coronary artery without angina pectoris: Secondary | ICD-10-CM | POA: Diagnosis present

## 2015-01-12 DIAGNOSIS — R509 Fever, unspecified: Secondary | ICD-10-CM | POA: Diagnosis not present

## 2015-01-12 DIAGNOSIS — Z66 Do not resuscitate: Secondary | ICD-10-CM | POA: Diagnosis present

## 2015-01-12 DIAGNOSIS — E876 Hypokalemia: Secondary | ICD-10-CM | POA: Diagnosis present

## 2015-01-12 DIAGNOSIS — G9340 Encephalopathy, unspecified: Secondary | ICD-10-CM

## 2015-01-12 DIAGNOSIS — E079 Disorder of thyroid, unspecified: Secondary | ICD-10-CM | POA: Diagnosis present

## 2015-01-12 DIAGNOSIS — M1990 Unspecified osteoarthritis, unspecified site: Secondary | ICD-10-CM | POA: Diagnosis present

## 2015-01-12 DIAGNOSIS — Z7901 Long term (current) use of anticoagulants: Secondary | ICD-10-CM

## 2015-01-12 DIAGNOSIS — Z823 Family history of stroke: Secondary | ICD-10-CM

## 2015-01-12 DIAGNOSIS — Z90710 Acquired absence of both cervix and uterus: Secondary | ICD-10-CM | POA: Diagnosis not present

## 2015-01-12 DIAGNOSIS — J9601 Acute respiratory failure with hypoxia: Secondary | ICD-10-CM

## 2015-01-12 DIAGNOSIS — Z951 Presence of aortocoronary bypass graft: Secondary | ICD-10-CM

## 2015-01-12 DIAGNOSIS — D649 Anemia, unspecified: Secondary | ICD-10-CM | POA: Diagnosis present

## 2015-01-12 DIAGNOSIS — G935 Compression of brain: Secondary | ICD-10-CM | POA: Diagnosis present

## 2015-01-12 DIAGNOSIS — I10 Essential (primary) hypertension: Secondary | ICD-10-CM | POA: Diagnosis present

## 2015-01-12 DIAGNOSIS — R402433 Glasgow coma scale score 3-8, at hospital admission: Secondary | ICD-10-CM | POA: Diagnosis present

## 2015-01-12 DIAGNOSIS — I482 Chronic atrial fibrillation: Secondary | ICD-10-CM | POA: Diagnosis present

## 2015-01-12 DIAGNOSIS — R4020 Unspecified coma: Secondary | ICD-10-CM

## 2015-01-12 DIAGNOSIS — Z515 Encounter for palliative care: Secondary | ICD-10-CM | POA: Diagnosis not present

## 2015-01-12 DIAGNOSIS — Z885 Allergy status to narcotic agent status: Secondary | ICD-10-CM

## 2015-01-12 DIAGNOSIS — E785 Hyperlipidemia, unspecified: Secondary | ICD-10-CM | POA: Diagnosis present

## 2015-01-12 DIAGNOSIS — F418 Other specified anxiety disorders: Secondary | ICD-10-CM | POA: Diagnosis present

## 2015-01-12 DIAGNOSIS — G9341 Metabolic encephalopathy: Secondary | ICD-10-CM | POA: Diagnosis present

## 2015-01-12 DIAGNOSIS — Z950 Presence of cardiac pacemaker: Secondary | ICD-10-CM

## 2015-01-12 DIAGNOSIS — R5081 Fever presenting with conditions classified elsewhere: Secondary | ICD-10-CM | POA: Diagnosis not present

## 2015-01-12 DIAGNOSIS — Z881 Allergy status to other antibiotic agents status: Secondary | ICD-10-CM | POA: Diagnosis not present

## 2015-01-12 DIAGNOSIS — S065X9A Traumatic subdural hemorrhage with loss of consciousness of unspecified duration, initial encounter: Secondary | ICD-10-CM | POA: Diagnosis present

## 2015-01-12 DIAGNOSIS — I6200 Nontraumatic subdural hemorrhage, unspecified: Secondary | ICD-10-CM | POA: Diagnosis not present

## 2015-01-12 DIAGNOSIS — R51 Headache: Secondary | ICD-10-CM | POA: Diagnosis present

## 2015-01-12 LAB — BLOOD GAS, ARTERIAL
ACID-BASE DEFICIT: 4.5 mmol/L — AB (ref 0.0–2.0)
ACID-BASE DEFICIT: 3.4 mmol/L — AB (ref 0.0–2.0)
Allens test (pass/fail): POSITIVE — AB
BICARBONATE: 20.6 meq/L (ref 20.0–24.0)
Bicarbonate: 20.3 mEq/L — ABNORMAL LOW (ref 21.0–28.0)
Drawn by: 437521
FIO2: 0.4
FIO2: 0.5
MECHVT: 450 mL
MECHVT: 450 mL
Mechanical Rate: 18
O2 SAT: 97.6 %
O2 Saturation: 99.1 %
PATIENT TEMPERATURE: 98.6
PEEP/CPAP: 5 cmH2O
PEEP: 5 cmH2O
PH ART: 7.36 (ref 7.350–7.450)
PH ART: 7.405 (ref 7.350–7.450)
PO2 ART: 102 mmHg (ref 83.0–108.0)
Patient temperature: 37
RATE: 18 resp/min
RATE: 18 resp/min
TCO2: 21.6 mmol/L (ref 0–100)
pCO2 arterial: 36 mmHg (ref 32.0–48.0)
pCO2 arterial: 33.5 mmHg — ABNORMAL LOW (ref 35.0–45.0)
pO2, Arterial: 147 mmHg — ABNORMAL HIGH (ref 80.0–100.0)

## 2015-01-12 LAB — URINALYSIS COMPLETE WITH MICROSCOPIC (ARMC ONLY)
BILIRUBIN URINE: NEGATIVE
GLUCOSE, UA: NEGATIVE mg/dL
Hgb urine dipstick: NEGATIVE
Ketones, ur: NEGATIVE mg/dL
Leukocytes, UA: NEGATIVE
Nitrite: NEGATIVE
PH: 6 (ref 5.0–8.0)
Protein, ur: NEGATIVE mg/dL
Specific Gravity, Urine: 1.015 (ref 1.005–1.030)

## 2015-01-12 LAB — CBC
HEMATOCRIT: 29.8 % — AB (ref 35.0–47.0)
HEMOGLOBIN: 9.4 g/dL — AB (ref 12.0–16.0)
MCH: 25.8 pg — AB (ref 26.0–34.0)
MCHC: 31.4 g/dL — ABNORMAL LOW (ref 32.0–36.0)
MCV: 82.3 fL (ref 80.0–100.0)
Platelets: 190 10*3/uL (ref 150–400)
RBC: 3.62 MIL/uL — AB (ref 3.80–5.20)
RDW: 17.3 % — ABNORMAL HIGH (ref 11.5–14.5)
WBC: 11.2 10*3/uL — ABNORMAL HIGH (ref 4.0–10.5)

## 2015-01-12 LAB — MRSA PCR SCREENING: MRSA BY PCR: NEGATIVE

## 2015-01-12 LAB — PROTIME-INR
INR: 1.48
PROTHROMBIN TIME: 18.1 s — AB (ref 11.4–15.0)

## 2015-01-12 LAB — BASIC METABOLIC PANEL
ANION GAP: 6 (ref 5–15)
BUN: 10 mg/dL (ref 6–20)
CHLORIDE: 113 mmol/L — AB (ref 101–111)
CO2: 22 mmol/L (ref 22–32)
CREATININE: 0.74 mg/dL (ref 0.44–1.00)
Calcium: 8.7 mg/dL — ABNORMAL LOW (ref 8.9–10.3)
GFR calc non Af Amer: >60 mL/min (ref >60)
Glucose, Bld: 99 mg/dL (ref 65–99)
Potassium: 3.4 mmol/L — ABNORMAL LOW (ref 3.5–5.1)
Sodium: 141 mmol/L (ref 135–145)

## 2015-01-12 LAB — GLUCOSE, CAPILLARY
GLUCOSE-CAPILLARY: 144 mg/dL — AB (ref 65–99)
Glucose-Capillary: 123 mg/dL — ABNORMAL HIGH (ref 65–99)

## 2015-01-12 LAB — TROPONIN I: Troponin I: 0.04 ng/mL — ABNORMAL HIGH (ref <0.031)

## 2015-01-12 MED ORDER — ETOMIDATE 2 MG/ML IV SOLN
20.0000 mg | Freq: Once | INTRAVENOUS | Status: AC
  Administered 2015-01-12: 20 mg via INTRAVENOUS

## 2015-01-12 MED ORDER — MORPHINE BOLUS VIA INFUSION
5.0000 mg | INTRAVENOUS | Status: DC | PRN
  Filled 2015-01-12: qty 20

## 2015-01-12 MED ORDER — MIDAZOLAM HCL 5 MG/5ML IJ SOLN
4.0000 mg | Freq: Once | INTRAMUSCULAR | Status: AC
  Administered 2015-01-12: 4 mg via INTRAVENOUS

## 2015-01-12 MED ORDER — SIMVASTATIN 20 MG PO TABS: 20.0000 mg | ORAL_TABLET | Freq: Every day | ORAL | Status: DC

## 2015-01-12 MED ORDER — LIDOCAINE HCL (CARDIAC) 20 MG/ML IV SOLN
100.0000 mg | Freq: Once | INTRAVENOUS | Status: AC
Start: 2015-01-12 — End: 2015-01-12
  Administered 2015-01-12: 100 mg via INTRAVENOUS

## 2015-01-12 MED ORDER — SODIUM CHLORIDE 0.9 % IV SOLN
500.0000 mg | Freq: Two times a day (BID) | INTRAVENOUS | Status: DC
  Filled 2015-01-12 (×2): qty 5

## 2015-01-12 MED ORDER — PANTOPRAZOLE SODIUM 40 MG IV SOLR: 40.0000 mg | Freq: Every day | INTRAVENOUS | Status: DC

## 2015-01-12 MED ORDER — INSULIN ASPART 100 UNIT/ML ~~LOC~~ SOLN
0.0000 [IU] | SUBCUTANEOUS | Status: DC
Start: 2015-01-12 — End: 2015-01-12
  Administered 2015-01-12: 1 [IU] via SUBCUTANEOUS

## 2015-01-12 MED ORDER — NICARDIPINE HCL IN NACL 20-0.86 MG/200ML-% IV SOLN
INTRAVENOUS | Status: AC
  Filled 2015-01-12: qty 200

## 2015-01-12 MED ORDER — MORPHINE SULFATE 25 MG/ML IV SOLN
10.0000 mg/h | INTRAVENOUS | Status: DC
  Administered 2015-01-13: 5 mg/h via INTRAVENOUS
  Administered 2015-01-14 – 2015-01-16 (×3): 10 mg/h via INTRAVENOUS
  Filled 2015-01-12 (×3): qty 10

## 2015-01-12 MED ORDER — NICARDIPINE HCL IN NACL 20-0.86 MG/200ML-% IV SOLN
3.0000 mg/h | INTRAVENOUS | Status: DC
  Administered 2015-01-12: 5 mg/h via INTRAVENOUS

## 2015-01-12 MED ORDER — LEVOTHYROXINE SODIUM 100 MCG IV SOLR: 75.0000 ug | Freq: Every day | INTRAVENOUS | Status: DC

## 2015-01-12 MED ORDER — PROMETHAZINE HCL 25 MG/ML IJ SOLN
12.5000 mg | Freq: Once | INTRAMUSCULAR | Status: AC
  Administered 2015-01-12: 12.5 mg via INTRAVENOUS

## 2015-01-12 MED ORDER — ROCURONIUM BROMIDE 50 MG/5ML IV SOLN
10.0000 mg | Freq: Once | INTRAVENOUS | Status: AC
  Administered 2015-01-12: 10 mg via INTRAVENOUS

## 2015-01-12 MED ORDER — PROTHROMBIN COMPLEX CONC HUMAN 500 UNITS IV KIT
3500.0000 [IU] | PACK | Status: AC
  Administered 2015-01-12: 3500 [IU] via INTRAVENOUS
  Filled 2015-01-12: qty 140

## 2015-01-12 MED ORDER — LORAZEPAM BOLUS VIA INFUSION
2.0000 mg | INTRAVENOUS | Status: DC | PRN
  Filled 2015-01-12: qty 5

## 2015-01-12 MED ORDER — MORPHINE SULFATE 25 MG/ML IV SOLN
10.0000 mg/h | INTRAVENOUS | Status: DC
  Administered 2015-01-12: 2 mg/h via INTRAVENOUS
  Filled 2015-01-12: qty 10

## 2015-01-12 MED ORDER — LORAZEPAM 2 MG/ML IJ SOLN
5.0000 mg/h | INTRAVENOUS | Status: DC
  Administered 2015-01-12: 2 mg/h via INTRAVENOUS
  Filled 2015-01-12 (×2): qty 25

## 2015-01-12 MED ORDER — AMIODARONE HCL 200 MG PO TABS: 200.0000 mg | ORAL_TABLET | Freq: Every day | ORAL | Status: DC

## 2015-01-12 MED ORDER — TRANEXAMIC ACID 1000 MG/10ML IV SOLN
500.0000 mg | Freq: Once | INTRAVENOUS | Status: DC
  Filled 2015-01-12: qty 10

## 2015-01-12 MED ORDER — LABETALOL HCL 5 MG/ML IV SOLN: 20.0000 mg | Freq: Once | INTRAVENOUS | Status: DC

## 2015-01-12 MED ORDER — PROPOFOL 1000 MG/100ML IV EMUL: 5.0000 ug/kg/min | INTRAVENOUS | Status: DC

## 2015-01-12 MED ORDER — PROPOFOL 1000 MG/100ML IV EMUL
0.0000 ug/kg/min | INTRAVENOUS | Status: DC
  Administered 2015-01-12: 30 ug/kg/min via INTRAVENOUS

## 2015-01-12 MED ORDER — SODIUM CHLORIDE 0.9 % IV SOLN: 250.0000 mL | INTRAVENOUS | Status: DC | PRN

## 2015-01-12 MED ORDER — LORAZEPAM 2 MG/ML IJ SOLN
5.0000 mg/h | INTRAVENOUS | Status: DC
  Administered 2015-01-12: 3 mg/h via INTRAVENOUS
  Administered 2015-01-13 – 2015-01-17 (×10): 5 mg/h via INTRAVENOUS
  Filled 2015-01-12 (×10): qty 25

## 2015-01-12 MED ORDER — PROMETHAZINE HCL 25 MG/ML IJ SOLN
INTRAMUSCULAR | Status: AC
  Administered 2015-01-12: 12.5 mg via INTRAVENOUS
  Filled 2015-01-12: qty 1

## 2015-01-12 MED ORDER — FENTANYL CITRATE (PF) 100 MCG/2ML IJ SOLN
100.0000 ug | Freq: Once | INTRAMUSCULAR | Status: AC
  Administered 2015-01-12: 100 ug via INTRAVENOUS

## 2015-01-12 MED ORDER — LORAZEPAM BOLUS VIA INFUSION
2.0000 mg | INTRAVENOUS | Status: DC | PRN
  Filled 2015-01-12 (×2): qty 5

## 2015-01-12 MED ORDER — FENTANYL CITRATE (PF) 100 MCG/2ML IJ SOLN: 100.0000 ug | INTRAMUSCULAR | Status: DC | PRN

## 2015-01-12 MED ORDER — PROPOFOL 1000 MG/100ML IV EMUL
INTRAVENOUS | Status: AC
  Filled 2015-01-12: qty 100

## 2015-01-12 MED ORDER — SODIUM CHLORIDE 0.9 % IV SOLN
INTRAVENOUS | Status: DC
Start: 2015-01-12 — End: 2015-01-12
  Administered 2015-01-12: 05:00:00 via INTRAVENOUS

## 2015-01-12 NOTE — Consult Note (Signed)
CC: Subdural hematoma No chief complaint on file.   HPI: Ruth Hodges is a 74 y.o. female on Eliquis with a spontaneous left hemispheric subdural hematoma.  She presented to [facility] with a one day history of progressively worsening headache.  While there her mental status deteriorated and she required intubation for airway protection.  She was given [place] and transferred to [facility].    PMH: Past Medical History  Diagnosis Date  . Anxiety   . Depression   . A-fib   . Arthritis   . Hypertension   . Hyperlipidemia   . Thyroid disease   . CAD (coronary artery disease)     PSH: Past Surgical History  Procedure Laterality Date  . Pacemaker insertion    . Cardiac catheterization    . Bladder surgery    . Cholecystectomy    . Appendectomy    . Coronary artery bypass graft    . Vaginal hysterectomy  1972    total, ovaries removed in 1983  . Cardiac catheterization N/A 11/27/2014    Procedure: Left Heart Cath and Cors/Grafts Angiography;  Surgeon: Teodoro Spray, MD;  Location: [place] [facility];  Service: Cardiovascular;  Laterality: N/A;  . Tee without cardioversion N/A 12/01/2014    Procedure: TRANSESOPHAGEAL ECHOCARDIOGRAM (TEE);  Surgeon: Teodoro Spray, MD;  Location: [facility] ORS;  Service: Cardiovascular;  Laterality: N/A;    SH: Social History  Substance Use Topics  . Smoking status: Never Smoker   . Smokeless tobacco: Never Used  . Alcohol Use: No    MEDS: Prior to Admission medications   Medication Sig Start Date End Date Taking? Authorizing Provider  alum & mag hydroxide-simeth (MAALOX/MYLANTA) 200-200-20 MG/5ML suspension Take 30 mLs by mouth every 6 (six) hours as needed for indigestion or heartburn (dyspepsia). 11/27/14   Teodoro Spray, MD  amiodarone (PACERONE) 400 MG tablet Take 1 tablet (400 mg total) by mouth daily. Patient taking differently: Take 200 mg by mouth daily.  12/01/14   Hillary Bow, MD  amitriptyline (ELAVIL) 75 MG tablet Take 75 mg by mouth  at bedtime.    Historical Provider, MD  apixaban (ELIQUIS) 5 MG TABS tablet Take 5 mg by mouth 2 (two) times daily.  08/31/14   Historical Provider, MD  Biotin 2500 MCG CAPS Take 5,000 mcg by mouth daily.  08/31/14   Historical Provider, MD  Cholecalciferol (VITAMIN D3) 1000 UNITS CAPS Take 1 capsule by mouth daily.  08/31/14   Historical Provider, MD  cyanocobalamin 100 MCG tablet Take 100 mcg by mouth daily.  08/31/14   Historical Provider, MD  digoxin (LANOXIN) 0.125 MG tablet Take 1 tablet (0.125 mg total) by mouth daily. 12/02/14   Gladstone Lighter, MD  felodipine (PLENDIL) 10 MG 24 hr tablet Take 10 mg by mouth daily.  08/31/14   Historical Provider, MD  ferrous sulfate 325 (65 FE) MG tablet Take 325 mg by mouth daily.    Historical Provider, MD  fluocinonide cream (LIDEX) 6.59 % Apply 1 application topically 3 (three) times daily. To rash except face 11/18/14   Carmon Ginsberg, PA  Influenza vac split quadrivalent PF (FLUARIX) 0.5 ML injection Inject 0.5 mLs into the muscle tomorrow at 10 am. 12/13/14   Nicholes Mango, MD  isosorbide mononitrate (IMDUR) 30 MG 24 hr tablet Take 30 mg by mouth daily. 10/27/14 10/27/15  Historical Provider, MD  levothyroxine (SYNTHROID, LEVOTHROID) 150 MCG tablet Take 150 mcg by mouth daily.  04/29/14   Historical Provider, MD  losartan (COZAAR)  100 MG tablet Take 100 mg by mouth daily.  04/29/14   Historical Provider, MD  meclizine (ANTIVERT) 12.5 MG tablet Take 1 tablet (12.5 mg total) by mouth 2 (two) times daily as needed for dizziness. 12/02/14   Gladstone Lighter, MD  metoprolol tartrate (LOPRESSOR) 25 MG tablet Take 0.5 tablets (12.5 mg total) by mouth 2 (two) times daily. 12/12/14   Nicholes Mango, MD  mirtazapine (REMERON) 15 MG tablet Take 1 tablet (15 mg total) by mouth at bedtime. 12/31/14   Richard Maceo Pro., MD  nitroGLYCERIN (NITROSTAT) 0.4 MG SL tablet Place 0.4 mg under the tongue. 10/27/14 10/27/15  Historical Provider, MD  Omega-3 Fatty Acids (FISH OIL) 1000 MG  CAPS Take 1,000 mg by mouth daily.    Historical Provider, MD  omeprazole (PRILOSEC) 20 MG capsule Take 20 mg by mouth daily.    Historical Provider, MD  ondansetron (ZOFRAN) 4 MG tablet Take 1 tablet (4 mg total) by mouth every 6 (six) hours as needed for nausea. 12/12/14   Nicholes Mango, MD  polyethylene glycol (MIRALAX / GLYCOLAX) packet Take 17 g by mouth daily. 12/12/14   Nicholes Mango, MD  simvastatin (ZOCOR) 20 MG tablet Take 20 mg by mouth daily.    Historical Provider, MD  torsemide (DEMADEX) 20 MG tablet Take 1 tablet (20 mg total) by mouth daily. 12/02/14   Gladstone Lighter, MD  traMADol (ULTRAM) 50 MG tablet Take 1 tablet (50 mg total) by mouth 3 (three) times daily as needed. 01/08/15   Margarita Rana, MD  venlafaxine XR (EFFEXOR-XR) 37.5 MG 24 hr capsule Take 1 capsule (37.5 mg total) by mouth daily with breakfast. 12/31/14   Jerrol Banana., MD  vitamin C (ASCORBIC ACID) 250 MG tablet Take 250 mg by mouth daily.    Historical Provider, MD    ALLERGY: Allergies  Allergen Reactions  . Codeine Other (See Comments)    Hallucination  . Ceftin  [Cefuroxime Axetil] Other (See Comments)    ROS: ROS Unable to obtain because patient is intubated  NEUROLOGIC EXAM: Intubated, sedation held No spontaneous eye opening Pupils 36mm and fixed bilaterally Extensor posturing on the left, non-specific movements on the right.  IMAGING: CT Head: Interval worsening of left convexity SDH.  Midline shift is now 1.8 cm  IMPRESSION/PLAN: - 74 y.o. female with large spontaneous left convexity subdural hematoma.  GCS 4T.  Her prognosis is incredibly poor.  I think it is extremely unlikely that even with an emergent craniotomy and hematoma evacuation she would survive, and she would almost certainly be vegetative.  I discussed this with Lattie Haw and Laruth Bouchard, her daugher and son-in-law, respectively, and they express understanding.  Their desire is for her to be kept comfortable.  I agree that this is  the most reasonable course of action.

## 2015-01-12 NOTE — ED Provider Notes (Signed)
[facility] Emergency Department Provider Note  ____________________________________________  Time seen: 12:15 AM  I have reviewed the triage vital signs and the nursing notes.   HISTORY  Chief Complaint Headache    HPI Ruth Hodges is a 74 y.o. female presents with 2 day history of progressive headache which acutely worsened last night. Patient states that she has a history of headaches however this is worse than the ones that she said in the past. Patient denies any weakness no numbness no nausea or vomiting no gait instability. Patient also denies any visual changes. Of note patient states that she took 2 tramadol at home without pain resolution. Aggravating factors   Past Medical History  Diagnosis Date  . Anxiety   . Depression   . A-fib   . Arthritis   . Hypertension   . Hyperlipidemia   . Thyroid disease   . CAD (coronary artery disease)     Patient Active Problem List   Diagnosis Date Noted  . Chest pain at rest 12/12/2014  . Acute on chronic combined systolic and diastolic heart failure 16/01/9603  . Troponin level elevated 12/01/2014  . Streptococcus viridans infection 12/01/2014  . Oxygen desaturation 12/01/2014  . Atrial fibrillation 11/23/2014  . Contact dermatitis 11/18/2014  . NSTEMI (non-ST elevated myocardial infarction) 10/15/2014  . Unstable angina 10/14/2014  . Allergic rhinitis 09/05/2014  . Arthritis 09/05/2014  . A-fib 09/05/2014  . 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 Arteriosclerosis of coronary artery 09/05/2014  . Anxiety and depression 09/05/2014  . Essential (primary) hypertension 09/05/2014  . Fibrositis 09/05/2014  . H/O acute myocardial infarction 09/05/2014  . DD (diverticular disease) 09/05/2014  . HLD (hyperlipidemia) 09/05/2014  . Adult hypothyroidism 09/05/2014  . 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 Mild cognitive disorder 09/05/2014  . Carotid arterial disease 09/05/2014   . Left ventricular hypertrophy 09/05/2014  . Aortic heart valve narrowing 09/05/2014  . Artificial cardiac pacemaker 07/21/2014    Past Surgical History  Procedure Laterality Date  . Pacemaker insertion    . Cardiac catheterization    . Bladder surgery    . Cholecystectomy    . Appendectomy    . Coronary artery bypass graft    . Vaginal hysterectomy  1972    total, ovaries removed in 1983  . Cardiac catheterization N/A 11/27/2014    Procedure: Left Heart Cath and Cors/Grafts Angiography;  Surgeon: Teodoro Spray, MD;  Location: [facility];  Service: Cardiovascular;  Laterality: N/A;  . Tee without cardioversion N/A 12/01/2014    Procedure: TRANSESOPHAGEAL ECHOCARDIOGRAM (TEE);  Surgeon: Teodoro Spray, MD;  Location: [facility];  Service: Cardiovascular;  Laterality: N/A;    Current Outpatient Rx  Name  Route  Sig  Dispense  Refill  . alum & mag hydroxide-simeth (MAALOX/MYLANTA) 200-200-20 MG/5ML suspension   Oral   Take 30 mLs by mouth every 6 (six) hours as needed for indigestion or heartburn (dyspepsia).   355 mL   0   . amiodarone (PACERONE) 400 MG tablet   Oral   Take 1 tablet (400 mg total) by mouth daily. Patient taking differently: Take 200 mg by mouth daily.    30 tablet   0   . amitriptyline (ELAVIL) 75 MG tablet   Oral   Take 75 mg by mouth at bedtime.         Marland Kitchen apixaban (ELIQUIS) 5 MG TABS tablet   Oral   Take 5 mg by mouth 2 (two)  times daily.          . Biotin 2500 MCG CAPS   Oral   Take 5,000 mcg by mouth daily.          . Cholecalciferol (VITAMIN D3) 1000 UNITS CAPS   Oral   Take 1 capsule by mouth daily.          . cyanocobalamin 100 MCG tablet   Oral   Take 100 mcg by mouth daily.          . digoxin (LANOXIN) 0.125 MG tablet   Oral   Take 1 tablet (0.125 mg total) by mouth daily.   30 tablet   0   . felodipine (PLENDIL) 10 MG 24 hr tablet   Oral   Take 10 mg by mouth daily.          . ferrous sulfate 325 (65 FE)  MG tablet   Oral   Take 325 mg by mouth daily.         . fluocinonide cream (LIDEX) 0.05 %   Topical   Apply 1 application topically 3 (three) times daily. To rash except face   15 g   0   . Influenza vac split quadrivalent PF (FLUARIX) 0.5 ML injection   Intramuscular   Inject 0.5 mLs into the muscle tomorrow at 10 am.   0.5 mL   0   . isosorbide mononitrate (IMDUR) 30 MG 24 hr tablet   Oral   Take 30 mg by mouth daily.         Marland Kitchen levothyroxine (SYNTHROID, LEVOTHROID) 150 MCG tablet   Oral   Take 150 mcg by mouth daily.          Marland Kitchen losartan (COZAAR) 100 MG tablet   Oral   Take 100 mg by mouth daily.          . meclizine (ANTIVERT) 12.5 MG tablet   Oral   Take 1 tablet (12.5 mg total) by mouth 2 (two) times daily as needed for dizziness.   30 tablet   0   . metoprolol tartrate (LOPRESSOR) 25 MG tablet   Oral   Take 0.5 tablets (12.5 mg total) by mouth 2 (two) times daily.   30 tablet   0   . mirtazapine (REMERON) 15 MG tablet   Oral   Take 1 tablet (15 mg total) by mouth at bedtime.   30 tablet   5   . nitroGLYCERIN (NITROSTAT) 0.4 MG SL tablet   Sublingual   Place 0.4 mg under the tongue.         . Omega-3 Fatty Acids (FISH OIL) 1000 MG CAPS   Oral   Take 1,000 mg by mouth daily.         Marland Kitchen omeprazole (PRILOSEC) 20 MG capsule   Oral   Take 20 mg by mouth daily.         . ondansetron (ZOFRAN) 4 MG tablet   Oral   Take 1 tablet (4 mg total) by mouth every 6 (six) hours as needed for nausea.   20 tablet   0   . polyethylene glycol (MIRALAX / GLYCOLAX) packet   Oral   Take 17 g by mouth daily.   14 each   0   . simvastatin (ZOCOR) 20 MG tablet   Oral   Take 20 mg by mouth daily.         Marland Kitchen torsemide (DEMADEX) 20 MG tablet   Oral   Take 1 tablet (  20 mg total) by mouth daily.   30 tablet   0   . traMADol (ULTRAM) 50 MG tablet   Oral   Take 1 tablet (50 mg total) by mouth 3 (three) times daily as needed.   30 tablet   0   .  venlafaxine XR (EFFEXOR-XR) 37.5 MG 24 hr capsule   Oral   Take 1 capsule (37.5 mg total) by mouth daily with breakfast.   30 capsule   5   . vitamin C (ASCORBIC ACID) 250 MG tablet   Oral   Take 250 mg by mouth daily.           Allergies Codeine and Ceftin   Family History  Problem Relation Age of Onset  . Heart attack Father   . CVA Sister   . Diabetes Brother   . Heart attack Brother     Social History Social History  Substance Use Topics  . Smoking status: Never Smoker   . Smokeless tobacco: Never Used  . Alcohol Use: No    Review of Systems  Constitutional: Negative for fever. Eyes: Negative for visual changes. ENT: Negative for sore throat. Cardiovascular: Negative for chest pain. Respiratory: Negative for shortness of breath. Gastrointestinal: Negative for abdominal pain, vomiting and diarrhea. Genitourinary: Negative for dysuria. Musculoskeletal: Negative for back pain. Skin: Negative for rash. Neurological: Positive for headaches  10-point ROS otherwise negative.  ____________________________________________   PHYSICAL EXAM:  VITAL SIGNS: ED Triage Vitals  Enc Vitals Group     BP 01/11/15 2327 179/69 mmHg     Pulse Rate 01/11/15 2327 65     Resp 01/11/15 2327 18     Temp 01/11/15 2327 97.7 F (36.5 C)     Temp Source 01/11/15 2327 Oral     SpO2 01/11/15 2327 94 %     Weight --      Height 01/11/15 2327 5\' 4"  (1.626 m)     Head Cir --      Peak Flow --      Pain Score 01/11/15 2325 8     Pain Loc --      Pain Edu? --      Excl. in [place]? --      Constitutional: Alert but somnolent. Apparent distress Eyes: Conjunctivae are normal. PERRL. Normal extraocular movements. ENT   Head: Normocephalic and atraumatic.   Nose: No congestion/rhinnorhea.   Mouth/Throat: Mucous membranes are moist.   Neck: No stridor. Hematological/Lymphatic/Immunilogical: No cervical lymphadenopathy. Cardiovascular: Normal rate, regular rhythm.  Normal and symmetric distal pulses are present in all extremities. Grade 3 systolic ejection murmur Respiratory: Normal respiratory effort without tachypnea nor retractions. Breath sounds are clear and equal bilaterally. No wheezes/rales/rhonchi. Gastrointestinal: Soft and nontender. No distention. There is no CVA tenderness. Genitourinary: deferred Musculoskeletal: Nontender with normal range of motion in all extremities. No joint effusions.  No lower extremity tenderness nor edema. Neurologic:  Normal speech and language. No gross focal neurologic deficits are appreciated. Speech is normal.  Skin:  Skin is warm, dry and intact. No rash noted. Psychiatric: Mood and affect are normal. Speech and behavior are normal. Patient exhibits appropriate insight and judgment.  ____________________________________________    LABS (pertinent positives/negatives)  Labs Reviewed  BASIC METABOLIC PANEL - Abnormal; Notable for the following:    Potassium 3.4 (*)    Chloride 113 (*)    Calcium 8.7 (*)    All other components within normal limits  CBC - Abnormal; Notable for the following:  WBC 11.2 (*)    RBC 3.62 (*)    Hemoglobin 9.4 (*)    HCT 29.8 (*)    MCH 25.8 (*)    MCHC 31.4 (*)    RDW 17.3 (*)    All other components within normal limits  TROPONIN I - Abnormal; Notable for the following:    Troponin I 0.04 (*)    All other components within normal limits  BLOOD GAS, ARTERIAL - Abnormal; Notable for the following:    Bicarbonate 20.3 (*)    Acid-base deficit 4.5 (*)    Allens test (pass/fail) POSITIVE (*)    All other components within normal limits  PROTIME-INR  URINALYSIS COMPLETEWITH MICROSCOPIC ([facility] ONLY)       RADIOLOGY DG Chest Portable 1 View (Final result) Result time: 12/19/2014 02:44:14   Final result by Rad Results In Interface (01/11/2015 02:44:14)   Narrative:   CLINICAL DATA: Life support line placement. Headache for 2 days, worsening  today.  EXAM: PORTABLE CHEST 1 VIEW  COMPARISON: Chest radiograph December 12, 2014 and CT chest November 23, 2014  FINDINGS: Endotracheal tube tip projects 2.9 cm above the carina. Nasogastric tube tip projects in mid stomach, side port past the GE junction. Cardiac silhouette is mildly enlarged, mildly calcified aortic knob. Status post median sternotomy with fractured proximal sternal wires. Strandy retrocardiac densities without pleural effusion. No pneumothorax. Dual lead LEFT cardiac pacemaker in situ. Soft tissue planes and included osseous structure nonsuspicious. Surgical clips in the included right abdomen compatible with cholecystectomy.  IMPRESSION: Endotracheal tube tip projects 2.9 cm above the carina. Nasogastric tube tip projects in mid stomach, side port past the GE junction.  Retrocardiac atelectasis. Mild cardiomegaly.   Electronically Signed By: Elon Alas M.D. On: 12/25/2014 02:44          CT Head Wo Contrast (Final result) Result time: 01/05/2015 01:29:47   Final result by Rad Results In Interface (12/21/2014 01:29:47)   Narrative:   CLINICAL DATA: Headache for 2 days, progressive.  EXAM: CT HEAD WITHOUT CONTRAST  TECHNIQUE: Contiguous axial images were obtained from the base of the skull through the vertex without intravenous contrast.  COMPARISON: Head CT 12/01/2014  FINDINGS: Acute left subdural hematoma measuring up to 1.3 cm in depth. This is near circumferential with minimal layering along the falx and tentorium. There is associated 7 mm left-to-right midline shift and effacement of the left lateral ventricle. The basilar cisterns remain patent. No intraventricular hemorrhage. Remote lacunar infarct versus enlarged perivascular space again seen in the left basal ganglia. No calvarial fracture. Fluid levels in the right and left sphenoid sinus, similar to prior exam.  IMPRESSION: Moderate-sized acute left subdural hematoma  measuring up to 1.3 cm in depth with associated 7 mm left-to-right midline shift.  Critical Value/emergent results were called by telephone at the time of interpretation on 01/14/2015 at 1:29 am to Dr. Marjean Donna , who verbally acknowledged these results.   Electronically Signed By: Jeb Levering M.D. On: 01/03/2015 01:29         ____________________________________________   PROCEDURES  Procedure(s) performed: INTUBATION Performed by: Gregor Hams  Required items: required blood products, implants, devices, and special equipment available Patient identity confirmed: provided demographic data and hospital-assigned identification number Time out: Immediately prior to procedure a "time out" was called to verify the correct patient, procedure, equipment, support staff and site/side marked as required.  Indications: Airway protection  Intubation method: Direct laryngoscopy   Preoxygenation: BVM  Sedatives: 20 mgEtomidate Paralytic: 10  mg rocuronium  Lidocaine 100 mg IV   Tube Size: 7.5cuffed  Post-procedure assessment: chest rise and ETCO2 monitor Breath sounds: equal and absent over the epigastrium Tube secured with: ETT holder Chest x-ray interpreted by radiologist and me.  Chest x-ray findings: endotracheal tube in appropriate position  Patient tolerated the procedure well with no immediate complications.     Critical Care performed: CRITICAL CARE Performed by: Marjean Donna N   Total critical care time: 90 minutes  Critical care time was exclusive of separately billable procedures and treating other patients.  Critical care was necessary to treat or prevent imminent or life-threatening deterioration.  Critical care was time spent personally by me on the following activities: development of treatment plan with patient and/or surrogate as well as nursing, discussions with consultants, evaluation of patient's response to treatment,  examination of patient, obtaining history from patient or surrogate, ordering and performing treatments and interventions, ordering and review of laboratory studies, ordering and review of radiographic studies, pulse oximetry and re-evaluation of patient's condition.   ____________________________________________   INITIAL IMPRESSION / ASSESSMENT AND PLAN / ED COURSE  Pertinent labs & imaging results that were available during my care of the patient were reviewed by me and considered in my medical decision making (see chart for details).  I reviewed the patient's CT scan report revealed a left subdural hematoma then discussed the patient with Dr. Marisue Humble who confirmed CT scan findings. Patient discussed with Dr. Suzanne Boron neurosurgeon on call who recommended that the patient be admitted to the intensive care unit at [facility]. Patient then discussed with Dr. Titus Mould critical care on call who agreed with interventions thus far and plan for intubation given patient's increasing level of somnolence and inability to protect her airway.  ____________________________________________   FINAL CLINICAL IMPRESSION(S) / ED DIAGNOSES  Final diagnoses:  Subdural hematoma      Gregor Hams, MD 12/18/2014 [id number]

## 2015-01-12 NOTE — Progress Notes (Signed)
Responded to spiritual consult to provide emotional and spiritual support to patient daughter and son-in law.  Chaplain Oneal Grout and I attended morning huddle/progressions to get an update on patient status. We were informed that patient is a DNR and that family maybe withdrawing life support later in the day.  Nurse and staff will call when family is ready.  I met with daughter Ruth Hodges and her husband Ruth Hodges to provide emotional, spiritual and anticipatory grief support as they prepare for their decision. Daughter is having difficult time coping but her husband is doing a great job in helping her to focus on what her mothers wishes were. Will follow as neededWat[place], Mattelyn Imhoff, Chaplain   01/13/2015 1400  Clinical Encounter Type  Visited With Patient and family together;Health care provider  Visit Type Initial;Spiritual support;Patient actively dying  Referral From Chaplain;Nurse  Spiritual Encounters  Spiritual Needs Emotional

## 2015-01-12 NOTE — ED Notes (Addendum)
Patient presents to ED with c/o headache x 2 days, reports pain is worse today. Patient has history of headaches. Reports took Tylenol and 2 Tramadol pills without relief. Patient alert and oriented x 4, respirations even and unlabored, skin warm and dry. Call bell within reach.

## 2015-01-12 NOTE — Progress Notes (Signed)
[facility] Progress Note Patient Name: Ruth Hodges DOB: [birth year] MRN: [id number]   Date of Service  12/24/2014  HPI/Events of Note  CAse d/w EDP, Dr Owens Shark. I reviewed all films, labs. Life threatening subdural bleed on Eliquis  eICU Interventions  For ETT Will order K centra Move to cone STAT 2M for NS evaluation Limited data on reversal agents , but life threatening, will treat     Intervention Category Major Interventions: Hemorrhage - evaluation and management  FEINSTEIN,DANIEL J. 12/20/2014, 2:11 AM

## 2015-01-12 NOTE — Progress Notes (Signed)
Nutrition Brief Note  Chart reviewed. Pt discussed during ICU rounds and with RN. Plan for comfort measures once family arrives.  No further nutrition interventions warranted at this time.  Please re-consult as needed.   [place], [place], [place] Pager [phone] After Hours Pager

## 2015-01-12 NOTE — H&P (Signed)
PULMONARY / CRITICAL CARE MEDICINE   Name: Ruth Hodges MRN: [id number] DOB: November 15, 1940    ADMISSION DATE:  01/14/2015 CONSULTATION DATE:  01/04/2015  REFERRING MD :  [facility] EDP  CHIEF COMPLAINT:  Headache  INITIAL PRESENTATION:  74 y.o. F brought to [facility] ED 9/26 with headache x 2 days.  Found to have left sided SDH; note, she is on Eliquis for A.Fib.  She was intubated and given K-Centra prior to being transferred to [facility] for further evaluation and management.  STUDIES:   CT head 9/27 >>> moderate sized acute left SDH with 42mm L to R MLS. CXR 9/27 >>> ETT in good position.  SIGNIFICANT EVENTS: 9/27 - transferred from [facility] to [facility] with left SDH, intubated prior to transfer.  HISTORY OF PRESENT ILLNESS:  Pt is encephalopathic; therefore, this HPI is obtained from chart review. Ruth Hodges is a 75 y.o. F with PMH as outlined below including A.fib for which she is on Eliquis.  She presented to [facility] ED 9/26 from independent living at [facility] due to having a headache for 2 days which acutely worsened overnight and was associated with nausea and pain centered over the left eye.  She had taken tramadol and tylenol without relief.  CT of the head revealed an acute left sided SDH associated with 28mm L to R MLS.  She was intubated and received K-Centra prior to being transferred to [facility] for further evaluation and management.  PAST MEDICAL HISTORY :   has a past medical history of Anxiety; Depression; A-fib; Arthritis; Hypertension; Hyperlipidemia; Thyroid disease; and CAD (coronary artery disease).  has past surgical history that includes Pacemaker insertion; Cardiac catheterization; Bladder surgery; Cholecystectomy; Appendectomy; Coronary artery bypass graft; Vaginal hysterectomy (1972); Cardiac catheterization (N/A, 11/27/2014); and TEE without cardioversion (N/A, 12/01/2014).   Prior to Admission medications   Medication Sig Start Date End Date Taking? Authorizing Ruth Hodges  alum & mag  hydroxide-simeth (MAALOX/MYLANTA) 200-200-20 MG/5ML suspension Take 30 mLs by mouth every 6 (six) hours as needed for indigestion or heartburn (dyspepsia). 11/27/14   Ruth Spray, MD  amiodarone (PACERONE) 400 MG tablet Take 1 tablet (400 mg total) by mouth daily. Patient taking differently: Take 200 mg by mouth daily.  12/01/14   Ruth Bow, MD  amitriptyline (ELAVIL) 75 MG tablet Take 75 mg by mouth at bedtime.    Historical Ruth Antonelli, MD  apixaban (ELIQUIS) 5 MG TABS tablet Take 5 mg by mouth 2 (two) times daily.  08/31/14   Historical Ruth Soltis, MD  Biotin 2500 MCG CAPS Take 5,000 mcg by mouth daily.  08/31/14   Historical Dodge Ator, MD  Cholecalciferol (VITAMIN D3) 1000 UNITS CAPS Take 1 capsule by mouth daily.  08/31/14   Historical Holbert Caples, MD  cyanocobalamin 100 MCG tablet Take 100 mcg by mouth daily.  08/31/14   Historical Samaya Boardley, MD  digoxin (LANOXIN) 0.125 MG tablet Take 1 tablet (0.125 mg total) by mouth daily. 12/02/14   Gladstone Lighter, MD  felodipine (PLENDIL) 10 MG 24 hr tablet Take 10 mg by mouth daily.  08/31/14   Historical Riccardo Holeman, MD  ferrous sulfate 325 (65 FE) MG tablet Take 325 mg by mouth daily.    Historical Rondy Krupinski, MD  fluocinonide cream (LIDEX) 4.13 % Apply 1 application topically 3 (three) times daily. To rash except face 11/18/14   Ruth Ginsberg, PA  Influenza vac split quadrivalent PF (FLUARIX) 0.5 ML injection Inject 0.5 mLs into the muscle tomorrow at 10 am. 12/13/14   Ruth Mango, MD  isosorbide  mononitrate (IMDUR) 30 MG 24 hr tablet Take 30 mg by mouth daily. 10/27/14 10/27/15  Historical Ruth Rankin, MD  levothyroxine (SYNTHROID, LEVOTHROID) 150 MCG tablet Take 150 mcg by mouth daily.  04/29/14   Historical Ruth Grzesiak, MD  losartan (COZAAR) 100 MG tablet Take 100 mg by mouth daily.  04/29/14   Historical Ruth Labella, MD  meclizine (ANTIVERT) 12.5 MG tablet Take 1 tablet (12.5 mg total) by mouth 2 (two) times daily as needed for dizziness. 12/02/14   Gladstone Lighter, MD   metoprolol tartrate (LOPRESSOR) 25 MG tablet Take 0.5 tablets (12.5 mg total) by mouth 2 (two) times daily. 12/12/14   Ruth Mango, MD  mirtazapine (REMERON) 15 MG tablet Take 1 tablet (15 mg total) by mouth at bedtime. 12/31/14   Ruth Maceo Pro., MD  nitroGLYCERIN (NITROSTAT) 0.4 MG SL tablet Place 0.4 mg under the tongue. 10/27/14 10/27/15  Historical Levonte Molina, MD  Omega-3 Fatty Acids (FISH OIL) 1000 MG CAPS Take 1,000 mg by mouth daily.    Historical Ruth Minahan, MD  omeprazole (PRILOSEC) 20 MG capsule Take 20 mg by mouth daily.    Historical Ruth Beed, MD  ondansetron (ZOFRAN) 4 MG tablet Take 1 tablet (4 mg total) by mouth every 6 (six) hours as needed for nausea. 12/12/14   Ruth Mango, MD  polyethylene glycol (MIRALAX / GLYCOLAX) packet Take 17 g by mouth daily. 12/12/14   Ruth Mango, MD  simvastatin (ZOCOR) 20 MG tablet Take 20 mg by mouth daily.    Historical Ruth Falzone, MD  torsemide (DEMADEX) 20 MG tablet Take 1 tablet (20 mg total) by mouth daily. 12/02/14   Gladstone Lighter, MD  traMADol (ULTRAM) 50 MG tablet Take 1 tablet (50 mg total) by mouth 3 (three) times daily as needed. 01/08/15   Ruth Rana, MD  venlafaxine XR (EFFEXOR-XR) 37.5 MG 24 hr capsule Take 1 capsule (37.5 mg total) by mouth daily with breakfast. 12/31/14   Ruth Banana., MD  vitamin C (ASCORBIC ACID) 250 MG tablet Take 250 mg by mouth daily.    Historical Ruth Dziedzic, MD   Allergies  Allergen Reactions  . Codeine Other (See Comments)    Hallucination  . Ceftin  [Cefuroxime Axetil] Other (See Comments)   FAMILY HISTORY:  Family History  Problem Relation Age of Onset  . Heart attack Father   . CVA Sister   . Diabetes Brother   . Heart attack Brother    SOCIAL HISTORY:  reports that she has never smoked. She has never used smokeless tobacco. She reports that she does not drink alcohol or use illicit drugs.  REVIEW OF SYSTEMS:  Unable to obtain as pt is encephalopathic.  SUBJECTIVE: No events overnight,  unresponsive.  VITAL SIGNS: Temp:  [97.7 F (36.5 C)] 97.7 F (36.5 C) (09/26 2327) Pulse Rate:  [59-73] 60 (09/27 0505) Resp:  [16-23] 19 (09/27 0505) BP: (158-194)/(55-75) 181/55 mmHg (09/27 0500) SpO2:  [94 %-100 %] 100 % (09/27 0505) FiO2 (%):  [40 %] 40 % (09/27 0505) HEMODYNAMICS:   VENTILATOR SETTINGS: Vent Mode:  [-] PRVC FiO2 (%):  [40 %] 40 % Set Rate:  [18 bmp] 18 bmp Vt Set:  [450 mL] 450 mL PEEP:  [5 cmH20] 5 cmH20 Plateau Pressure:  [27 cmH20] 27 cmH20 INTAKE / OUTPUT: Intake/Output    None    PHYSICAL EXAMINATION: General: Elderly female, in NAD. Neuro: Sedated, follows basic commands. HEENT: [place]/AT. PERRL, sclerae anicteric. Cardiovascular: IRIR, no M/R/G.  Lungs: Respirations even and unlabored.  CTA bilaterally, No  W/R/R. Abdomen: BS x 4, soft, NT/ND.  Musculoskeletal: No gross deformities, no edema.  Skin: Intact, warm, no rashes.  LABS:  CBC  Recent Labs Lab 01/03/2015 0052  WBC 11.2*  HGB 9.4*  HCT 29.8*  PLT 190   Coag's  Recent Labs Lab 01/05/2015 0332  INR 1.48   BMET  Recent Labs Lab 12/20/2014 0052  NA 141  K 3.4*  CL 113*  CO2 22  BUN 10  CREATININE 0.74  GLUCOSE 99   Electrolytes  Recent Labs Lab 12/21/2014 0052  CALCIUM 8.7*   Sepsis Markers No results for input(s): LATICACIDVEN, PROCALCITON, O2SATVEN in the last 168 hours. ABG  Recent Labs Lab 01/10/2015 0239  PHART 7.36  PCO2ART 36  PO2ART 102   Liver Enzymes No results for input(s): AST, ALT, ALKPHOS, BILITOT, ALBUMIN in the last 168 hours. Cardiac Enzymes  Recent Labs Lab 12/27/2014 0052  TROPONINI 0.04*   Glucose No results for input(s): GLUCAP in the last 168 hours.  Imaging Ct Head Wo Contrast  01/11/2015   CLINICAL DATA:  Headache for 2 days, progressive.  EXAM: CT HEAD WITHOUT CONTRAST  TECHNIQUE: Contiguous axial images were obtained from the base of the skull through the vertex without intravenous contrast.  COMPARISON:  Head CT 12/01/2014   FINDINGS: Acute left subdural hematoma measuring up to 1.3 cm in depth. This is near circumferential with minimal layering along the falx and tentorium. There is associated 7 mm left-to-right midline shift and effacement of the left lateral ventricle. The basilar cisterns remain patent. No intraventricular hemorrhage. Remote lacunar infarct versus enlarged perivascular space again seen in the left basal ganglia. No calvarial fracture. Fluid levels in the right and left sphenoid sinus, similar to prior exam.  IMPRESSION: Moderate-sized acute left subdural hematoma measuring up to 1.3 cm in depth with associated 7 mm left-to-right midline shift.  Critical Value/emergent results were called by telephone at the time of interpretation on 12/28/2014 at 1:29 am to Dr. Marjean Donna , who verbally acknowledged these results.   Electronically Signed   By: Jeb Levering M.D.   On: 12/24/2014 01:29   Dg Chest Portable 1 View  12/19/2014   CLINICAL DATA:  Life support line placement. Headache for 2 days, worsening today.  EXAM: PORTABLE CHEST 1 VIEW  COMPARISON:  Chest radiograph December 12, 2014 and CT chest November 23, 2014  FINDINGS: Endotracheal tube tip projects 2.9 cm above the carina. Nasogastric tube tip projects in mid stomach, side port past the GE junction. Cardiac silhouette is mildly enlarged, mildly calcified aortic knob. Status post median sternotomy with fractured proximal sternal wires. Strandy retrocardiac densities without pleural effusion. No pneumothorax. Dual lead LEFT cardiac pacemaker in situ. Soft tissue planes and included osseous structure nonsuspicious. Surgical clips in the included right abdomen compatible with cholecystectomy.  IMPRESSION: Endotracheal tube tip projects 2.9 cm above the carina. Nasogastric tube tip projects in mid stomach, side port past the GE junction.  Retrocardiac atelectasis.  Mild cardiomegaly.   Electronically Signed   By: Elon Alas M.D.   On: 01/03/2015 02:44    ASSESSMENT / PLAN:  NEUROLOGIC A:    Acute left sided SDH with 19mm L to R MLS. Acute metabolic encephalopathy due to sedation. Hx anxiety, depression. P:    Neurosurgery consult called by Dr. Titus Mould early AM 9/27. Sedation:  Propofol gtt / Fentanyl PRN. RASS goal: 0 to -1. Hold WUA until evaluated by neurosurgery and further plans made (OR, etc). Repeat head CT now.  Keppra, EEG. Hold outpatient amitriptyline, meclizine, mirtazapine,  venlaxafine.  PULMONARY OETT 9/27 >>> A: VDRF due to inability to protect airway in the setting of acute left sided SDH. P:    Full mechanical support, wean as able. VAP prevention measures. Hold SBT until evaluated by neurosurgery. CXR in AM.  CARDIOVASCULAR A:   Hypertension. Mild troponin leak. Hx A.fib on Eliquis, HTN, HLD, CAD. P:   Cardene gtt for goal SBP < 150. Trend troponins. Check EKG. Hold outpatient amiodarone for now given intermittent bradycardia, consider re-start in AM. Continue outpatient simvastatin. Hold outpatient digoxin, eliquis, felodipine, imdur, losartan, lopressor, torsemide.  RENAL A:    Hypokalemia. P:    NS @ 75. STAT CMP. BMP in AM.  GASTROINTESTINAL A:    GI prophylaxis. Nutrition. P:    SUP: Pantoprazole. NPO.  HEMATOLOGIC A:    Chronic anticoagulation with Eliquis - now with acute SDH; s/p KCentra. Anemia - chronic. VTE Prophylaxis. P:   Transfuse for Hgb < 7. SCD's only. Check coags. CBC in AM.  INFECTIOUS A:    No indication of infection. P:    Monitor clinically.  ENDOCRINE A:    Hypothyroidism.   P:    Continue outpatient synthroid, change to IV form. Check TSH.  Family updated: None.  Interdisciplinary Family Meeting v Palliative Care Meeting:  Due by: 10/3.  Montey Hora, [place] Pulmonary & Critical Care Medicine Pager: [phone]  or [phone] 01/10/2015, 5:27 AM  Attending Note:  74 year old female with PMH of a-fib on eliquis  presenting with massive SDH that was spontaneous and getting worse.  The patient's neurologic exam is positive only for a gag and a respiratory drive but that is it.  Family brought her living will and patient wanted no life support and no aggressive care.  Based on that and radiologic and neurologic findings spoke with daughter and son-in-law.  Confirmed DNR status and grand-daughter is on her way in and once present will terminally extubate.  Start morphine and ativan now then will extubate upon granddaughter's arrival.  The patient is critically ill with multiple organ systems failure and requires high complexity decision making for assessment and support, frequent evaluation and titration of therapies, application of advanced monitoring technologies and extensive interpretation of multiple databases.   Critical Care Time devoted to patient care services described in this note is  35  Minutes. This time reflects time of care of this signee Dr Jennet Maduro. This critical care time does not reflect procedure time, or teaching time or supervisory time of PA/NP/Med student/Med Resident etc but could involve care discussion time.  Rush Farmer, M.D. [facility] Pulmonary/Critical Care Medicine. Pager: [phone]. After hours pager: [phone].

## 2015-01-12 NOTE — ED Notes (Signed)
Called report and spoke with Vaughan Basta, RN at [facility]. Informed RN, [facility] en route to [employer].

## 2015-01-12 NOTE — Progress Notes (Signed)
Utilization Review Completed.Dowell, Deborah T9/27/2016  

## 2015-01-12 NOTE — Progress Notes (Signed)
ETT pulled back 2cm at this time, per MD order, now 20 @ lip

## 2015-01-12 NOTE — Progress Notes (Signed)
eLink Physician-Brief Progress Note Patient Name: Ruth Hodges DOB: 10/14/1940 MRN: [id number]   Date of Service  12/18/2014  HPI/Events of Note  Updated daughter and son in law Horrible prognosis, unlikley to survive    eICU Interventions  I have had extensive discussions with family We discussed patients current circumstances and organ failures. We also discussed patient's prior wishes under circumstances such as this. Family has decided to NOT perform resuscitation if arrest . comofrt when they arrive, no escalation         Intervention Category Major Interventions: End of life / care limitation discussion  Raylene Miyamoto. 01/05/2015, 6:23 AM

## 2015-01-12 NOTE — Progress Notes (Signed)
Pt transported to CT and back while on ventilator.

## 2015-01-13 DIAGNOSIS — Z515 Encounter for palliative care: Secondary | ICD-10-CM

## 2015-01-13 NOTE — Progress Notes (Signed)
Pt's monitors are turned off.

## 2015-01-13 NOTE — Progress Notes (Signed)
PULMONARY / CRITICAL CARE MEDICINE   Name: Ruth Hodges MRN: [id number] DOB: May 29, 1940    ADMISSION DATE:  01/03/2015 CONSULTATION DATE:  12/28/2014  REFERRING MD :  [facility] EDP  CHIEF COMPLAINT:  Headache  INITIAL PRESENTATION:  74 y.o. F brought to [facility] ED 9/26 with headache x 2 days.  Found to have left sided SDH; note, she is on Eliquis for A.Fib.  She was intubated and given K-Centra prior to being transferred to [facility] for further evaluation and management.  STUDIES:   CT head 9/27 >>> moderate sized acute left SDH with 66mm L to R MLS. CXR 9/27 >>> ETT in good position.  SIGNIFICANT EVENTS: 9/27 - transferred from [facility] to [facility] with left SDH, intubated prior to transfer.  HISTORY OF PRESENT ILLNESS:  Pt is encephalopathic; therefore, this HPI is obtained from chart review. Ruth Hodges is a 74 y.o. F with PMH as outlined below including A.fib for which she is on Eliquis.  She presented to [facility] ED 9/26 from independent living at [facility] due to having a headache for 2 days which acutely worsened overnight and was associated with nausea and pain centered over the left eye.  She had taken tramadol and tylenol without relief.  CT of the head revealed an acute left sided SDH associated with 29mm L to R MLS.  She was intubated and received K-Centra prior to being transferred to [facility] for further evaluation and management.  SUBJECTIVE: No events overnight, tachy but remains unresponsive.  VITAL SIGNS: Pulse Rate:  [59-147] 59 (09/28 0900) Resp:  [18-20] 18 (09/28 0900) BP: (100-104)/(28-38) 101/28 mmHg (09/28 0900) SpO2:  [99 %-100 %] 99 % (09/28 0900) FiO2 (%):  [50 %] 50 % (09/28 0800) HEMODYNAMICS:   VENTILATOR SETTINGS: Vent Mode:  [-] PRVC FiO2 (%):  [50 %] 50 % Set Rate:  [18 bmp] 18 bmp Vt Set:  [450 mL] 450 mL PEEP:  [5 cmH20] 5 cmH20 Plateau Pressure:  [19 cmH20-22 cmH20] 20 cmH20 INTAKE / OUTPUT: Intake/Output      09/27 0701 - 09/28 0700 09/28 0701 - 09/29 0700   I.V.  (mL/kg) 102.1 (1.4)    Total Intake(mL/kg) 102.1 (1.4)    Urine (mL/kg/hr) 625 (0.3)    Total Output 625     Net -522.9           PHYSICAL EXAMINATION: General: Elderly female, in NAD. Neuro: Sedated, follows basic commands. HEENT: [place]/AT. PERRL, sclerae anicteric. Cardiovascular: IRIR, no M/R/G.  Lungs: Respirations even and unlabored.  CTA bilaterally, No W/R/R. Abdomen: BS x 4, soft, NT/ND.  Musculoskeletal: No gross deformities, no edema.  Skin: Intact, warm, no rashes.  LABS:  CBC  Recent Labs Lab 01/13/2015 0052  WBC 11.2*  HGB 9.4*  HCT 29.8*  PLT 190   Coag's  Recent Labs Lab 12/28/2014 0332  INR 1.48   BMET  Recent Labs Lab 01/11/2015 0052  NA 141  K 3.4*  CL 113*  CO2 22  BUN 10  CREATININE 0.74  GLUCOSE 99   Electrolytes  Recent Labs Lab 12/21/2014 0052  CALCIUM 8.7*   Sepsis Markers No results for input(s): LATICACIDVEN, PROCALCITON, O2SATVEN in the last 168 hours. ABG  Recent Labs Lab 12/24/2014 0239 01/05/2015 0530  PHART 7.36 7.405  PCO2ART 36 33.5*  PO2ART 102 147*   Liver Enzymes No results for input(s): AST, ALT, ALKPHOS, BILITOT, ALBUMIN in the last 168 hours. Cardiac Enzymes  Recent Labs Lab 12/21/2014 0052  TROPONINI 0.04*  Glucose  Recent Labs Lab 12/31/2014 0528 12/22/2014 0813  GLUCAP 144* 123*    Imaging No results found. ASSESSMENT / PLAN:  NEUROLOGIC A:    Acute left sided SDH with 82mm L to R MLS. Acute metabolic encephalopathy due to sedation. Hx anxiety, depression. P:    Neurosurgery consult appreciated. Sedation:  Ativan and morphine on board for comfort. Keppra, EEG noted. Hold outpatient amitriptyline, meclizine, mirtazapine,  venlaxafine.  PULMONARY OETT 9/27 >>> A: VDRF due to inability to protect airway in the setting of acute left sided SDH. P:    Full mechanical support for now, extubate when family is ready. VAP prevention measures. Full comfort care.  CARDIOVASCULAR A:    Hypertension. Mild troponin leak. Hx A.fib on Eliquis, HTN, HLD, CAD. P:   D/C Cardene, full comfort care. Hold outpatient digoxin, eliquis, felodipine, imdur, losartan, lopressor, torsemide.  RENAL A:    Hypokalemia. P:    KVO IVF. D/C further blood draws.  GASTROINTESTINAL A:    GI prophylaxis. Nutrition. P:    D/C Pantoprazole. NPO.  HEMATOLOGIC A:    Chronic anticoagulation with Eliquis - now with acute SDH; s/p KCentra. Anemia - chronic. VTE Prophylaxis. P:   D/C further blood draws.  INFECTIOUS A:    No indication of infection. P:    Monitor clinically.  ENDOCRINE A:    Hypothyroidism.   P:    D/C synthroid.  Family updated: No family bedside, per RN awaiting grand daughter for terminal extubation.  The patient is critically ill with multiple organ systems failure and requires high complexity decision making for assessment and support, frequent evaluation and titration of therapies, application of advanced monitoring technologies and extensive interpretation of multiple databases.   Critical Care Time devoted to patient care services described in this note is  35  Minutes. This time reflects time of care of this signee Dr Jennet Maduro. This critical care time does not reflect procedure time, or teaching time or supervisory time of PA/NP/Med student/Med Resident etc but could involve care discussion time.  Rush Farmer, M.D. [facility] Pulmonary/Critical Care Medicine. Pager: [phone]. After hours pager: [phone].

## 2015-01-13 NOTE — Clinical Documentation Improvement (Signed)
Neuro Surgery Critical Care  Can the diagnosis on the current CT of the head  "sulfalcine herniation"  "left uncal herniation"  be confirmed  if appropriate for this admission? Thank you    Brain herniation  Sulfalcine herniation  Transtentorial herniation  Other condition  Clinically Undetermined     Supporting Information:  CT HEAD WITHOUT CONTRAST    COMPARISON: Same day at 1:18 a.m.   Brain: Increasing left cerebral convexity acute subdural hematoma, now 2 cm in thickness (previously 12 mm) with worsening midline shift, now 17 mm (previously 7 mm). There is left uncal and subfalcine herniation. New entrapment of the right lateral ventricle. No ACA ischemic changes. Pre-existing patchy low-density in the cerebral white matter consistent with chronic small vessel disease. Dilated perivascular space along the lower left sylvian fissure.   IMPRESSION: Enlarging left cerebral convexity subdural hematoma, now 2 cm thick, with subfalcine and uncal herniation. New entrapment of the right lateral ventricle.   Please exercise your independent, professional judgment when responding. A specific answer is not anticipated or expected.   Thank You,  Inez [phone]

## 2015-01-13 NOTE — Procedures (Signed)
Extubation Procedure Note  Patient Details:   Name: Ruth Hodges DOB: 02/23/1941 MRN: [id number]   Airway Documentation:  Airway 7.5 mm (Active)  Secured at (cm) 20 cm 01/13/2015  7:30 AM  Measured From Lips 01/13/2015  7:30 AM  [place] 01/13/2015  7:30 AM  Secured By [employer] 01/13/2015  7:30 AM  Tube Holder Repositioned Yes 01/13/2015  7:30 AM  Site Condition Dry 01/13/2015  7:30 AM    Evaluation  O2 sats: currently acceptable Complications: No apparent complications Patient did tolerate procedure well. Bilateral Breath Sounds: Clear, Diminished Suctioning: Airway No   Terminal wean. Placed pt on 2 Lpm nasal cannula post extubation.  Bayard Beaver 01/13/2015, 12:37 PM

## 2015-01-13 NOTE — Care Management Note (Signed)
Case Management Note  Patient Details  Name: Carnita Golob MRN: [id number] Date of Birth: 07/29/1940  Subjective/Objective:   Pt admitted on 01/07/2015 with HA, subdural hematoma.  PTA, pt resided at home with family.                  Action/Plan: Pt with poor prognosis; family has chosen to offer comfort care.    Expected Discharge Date:                  Expected Discharge Plan:  Expired  In-House Referral:     Discharge planning Services  CM Consult  Post Acute Care Choice:    Choice offered to:     DME Arranged:    DME Agency:     HH Arranged:    HH Agency:     Status of Service:  In process, will continue to follow  Medicare Important Message Given:    Date Medicare IM Given:    Medicare IM give by:    Date Additional Medicare IM Given:    Additional Medicare Important Message give by:     If discussed at [place] of Stay Meetings, dates discussed:    Additional Comments:  Reinaldo Raddle, RN, BSN  Trauma/Neuro ICU Case Manager [phone]

## 2015-01-14 ENCOUNTER — Telehealth: Payer: Self-pay | Admitting: Family Medicine

## 2015-01-14 DIAGNOSIS — R4020 Unspecified coma: Secondary | ICD-10-CM

## 2015-01-14 DIAGNOSIS — I482 Chronic atrial fibrillation: Secondary | ICD-10-CM

## 2015-01-14 DIAGNOSIS — R40243 Glasgow coma scale score 3-8: Secondary | ICD-10-CM

## 2015-01-14 DIAGNOSIS — E039 Hypothyroidism, unspecified: Secondary | ICD-10-CM

## 2015-01-14 MED ORDER — CHLORHEXIDINE GLUCONATE 0.12 % MT SOLN
15.0000 mL | Freq: Two times a day (BID) | OROMUCOSAL | Status: DC
  Administered 2015-01-14 – 2015-01-16 (×6): 15 mL via OROMUCOSAL
  Filled 2015-01-14 (×5): qty 15

## 2015-01-14 MED ORDER — CETYLPYRIDINIUM CHLORIDE 0.05 % MT LIQD
7.0000 mL | Freq: Two times a day (BID) | OROMUCOSAL | Status: DC
  Administered 2015-01-14 – 2015-01-16 (×6): 7 mL via OROMUCOSAL

## 2015-01-14 NOTE — Clinical Social Work Note (Signed)
CSW received consult for patient's family wanting to have patient possibly go to [place].  CSW contacted [facility] and they requested patient's information be faxed to them.  CSW faxed information.  Patient's family then called back and said they would rather wait till tomorrow to make decision on having patient go there.  CSW informed physician Dr. Lake Bells, [place] to continue to follow patient's progress.  Jones Broom. [place], MSW, [place] 01/14/2015 12:16 PM

## 2015-01-14 NOTE — Progress Notes (Signed)
PULMONARY / CRITICAL CARE MEDICINE   Name: Ruth Hodges MRN: [id number] DOB: 1941/01/25    ADMISSION DATE:  01/01/2015 CONSULTATION DATE:  01/11/2015  REFERRING MD :  [facility] EDP  Diagnosis:  1) Spontaneous subdural hematoma 2) Coma   INITIAL PRESENTATION and hospital course:  74 y.o. F brought to [facility] ED 9/26 with headache x 2 days.  Found to have left sided SDH; note, she is on Eliquis for A.Fib.  She was intubated and given K-Centra prior to being transferred to [facility] for further evaluation and management.  She was evaluated by neurosurgery in the emergency department. She was found to have left to right midline shift secondary to mass effect from the subdural hematoma. Neurosurgery felt that regardless of intervention her outcome was incredibly poor and she would not survive the illness. The family was updated in the emergency department regarding this. After extensive conversation with the critical care team on admission it was decided to make her CODE STATUS DO NOT RESUSCITATE and to not perform aggressive measures. She had been intubated in the emergency department but then extubated on 01/13/2015 with palliative intent.  STUDIES:   CT head 9/27 >>> moderate sized acute left SDH with 64mm L to R MLS. CXR 9/27 >>> ETT in good position.  SIGNIFICANT EVENTS: 9/27 - transferred from [facility] to [facility] with left SDH, intubated prior to transfer. September 28> extubated, morphine drip, Ativan drip   Overnight events as of 01/14/2015: No acute events, extubated 01/13/2015. Transfer to palliative medicine floor. Remains unresponsive.  VITAL SIGNS: Temp:  [99 F (37.2 C)-103.2 F (39.6 C)] 103.2 F (39.6 C) (09/29 0512) Pulse Rate:  [62-113] 113 (09/29 0512) Resp:  [11-23] 11 (09/29 0512) BP: (109-163)/(44-61) 109/45 mmHg (09/29 0512) SpO2:  [86 %-100 %] 86 % (09/29 0512) HEMODYNAMICS:   VENTILATOR SETTINGS:   INTAKE / OUTPUT: Intake/Output      09/28 0701 - 09/29 0700 09/29 0701 - 09/30 0700    I.V. (mL/kg) 44.5 (0.6)    Total Intake(mL/kg) 44.5 (0.6)    Urine (mL/kg/hr) 350 (0.2)    Total Output 350     Net -305.5           PHYSICAL EXAMINATION: Gen: elderly female, snoring, no obvious distress HENT: OP clear, NCAT PULM: CTA B, normal effort CV: irreg irreg, no mgr, trace edema GI: BS+, soft, nontender Derm: no cyanosis or rash Neuro: no response to painful stimuli, non-verbal, no eye opening, pupils ~34mm, no response to light, no corneal reflex   LABS:  CBC  Recent Labs Lab 12/19/2014 0052  WBC 11.2*  HGB 9.4*  HCT 29.8*  PLT 190   Coag's  Recent Labs Lab 01/05/2015 0332  INR 1.48   BMET  Recent Labs Lab 01/08/2015 0052  NA 141  K 3.4*  CL 113*  CO2 22  BUN 10  CREATININE 0.74  GLUCOSE 99   Electrolytes  Recent Labs Lab 01/11/2015 0052  CALCIUM 8.7*   Sepsis Markers No results for input(s): LATICACIDVEN, PROCALCITON, O2SATVEN in the last 168 hours. ABG  Recent Labs Lab 12/19/2014 0239 01/13/2015 0530  PHART 7.36 7.405  PCO2ART 36 33.5*  PO2ART 102 147*   Liver Enzymes No results for input(s): AST, ALT, ALKPHOS, BILITOT, ALBUMIN in the last 168 hours. Cardiac Enzymes  Recent Labs Lab 01/01/2015 0052  TROPONINI 0.04*   Glucose  Recent Labs Lab 12/28/2014 0528 12/26/2014 0813  GLUCAP 144* 123*    Imaging  9/27 CXR images reviewed: ET tube in  place, by basilar atelectasis, dual-chamber pacemaker in place, no coil noted on pacemaker wires  ASSESSMENT / PLAN:  Principal Problem:   Subdural hematoma Active Problems:   A-fib   Adult hypothyroidism   Coma  Discussion: Ruth Hodges was extubated from mechanical ventilation yesterday and has been transferred to the floor with a morphine infusion for comfort. There have been no major acute events overnight and she is currently resting comfortably. She remains comatose. There was no response on my exam today. She will die in the hospital.  Continue morphine infusion Continue Ativan  infusion Continue mouth care  Case discussed with nursing, no acute issues   Roselie Awkward, MD [place] PCCM Pager: [phone] Cell: [phone] After 3pm or if no response, call [phone]

## 2015-01-14 NOTE — Telephone Encounter (Signed)
See below please-aa 

## 2015-01-14 NOTE — Telephone Encounter (Signed)
FYI....   [name] with [facility] reported that he called and went by her residence three different times , no one was home.  Later found out that she had had a stroke and is either in the hospital or another facility.  Just wanted to report findings...   ThanksTeri

## 2015-01-15 NOTE — Care Management Important Message (Signed)
Important Message  Patient Details  Name: Ruth Hodges MRN: [id number] Date of Birth: 11-Jul-1940   Medicare Important Message Given:  Yes-second notification given    Delorse Lek 01/15/2015, 3:17 PM

## 2015-01-15 NOTE — Progress Notes (Signed)
PULMONARY / CRITICAL CARE MEDICINE   Name: Ruth Hodges MRN: [id number] DOB: 03-02-1941    ADMISSION DATE:  01/07/2015 CONSULTATION DATE:  01/11/2015  REFERRING MD :  [facility] EDP  Diagnosis:  1) Spontaneous subdural hematoma 2) Coma   INITIAL PRESENTATION and hospital course:  74 y.o. F brought to [facility] ED 9/26 with headache x 2 days.  Found to have left sided SDH; note, she is on Eliquis for A.Fib.  She was intubated and given K-Centra prior to being transferred to [facility] for further evaluation and management.  She was evaluated by neurosurgery in the emergency department. She was found to have left to right midline shift secondary to mass effect from the subdural hematoma. Neurosurgery felt that regardless of intervention her outcome was incredibly poor and she would not survive the illness. The family was updated in the emergency department regarding this. After extensive conversation with the critical care team on admission it was decided to make her CODE STATUS DO NOT RESUSCITATE and to not perform aggressive measures. She had been intubated in the emergency department but then extubated on 01/13/2015 with palliative intent.  STUDIES:   CT head 9/27 >>> moderate sized acute left SDH with 26mm L to R MLS. CXR 9/27 >>> ETT in good position.  SIGNIFICANT EVENTS: 9/27 - transferred from [facility] to [facility] with left SDH, intubated prior to transfer. September 28> extubated, morphine drip, Ativan drip   Overnight events as of 01/15/2015: No acute events, Remains unresponsive.  VITAL SIGNS: Temp:  [102.6 F (39.2 C)-103.6 F (39.8 C)] 102.9 F (39.4 C) (09/30 0456) Pulse Rate:  [110-123] 123 (09/30 0435) Resp:  [9-20] 9 (09/30 0435) BP: (89-96)/(40-91) 89/40 mmHg (09/30 0435) SpO2:  [91 %-92 %] 91 % (09/30 0435) HEMODYNAMICS:   VENTILATOR SETTINGS:   INTAKE / OUTPUT: Intake/Output      09/29 0701 - 09/30 0700 09/30 0701 - 10/01 0700   P.O. 0    I.V. (mL/kg)     Total Intake(mL/kg) 0 (0)    Urine (mL/kg/hr) 665 (0.4)    Total Output 665     Net -665           PHYSICAL EXAMINATION: Gen: elderly female, comfortable, no obvious distress HENT: OP clear, NCAT PULM: CTA B, normal effort CV: irreg irreg, no mgr, trace edema GI: BS+, soft, nontender Derm: no cyanosis or rash Neuro: no response to painful stimuli, non-verbal, no eye opening, pupils ~61mm, no response to light, no corneal reflex   LABS:  CBC  Recent Labs Lab 12/23/2014 0052  WBC 11.2*  HGB 9.4*  HCT 29.8*  PLT 190   Coag's  Recent Labs Lab 12/27/2014 0332  INR 1.48   BMET  Recent Labs Lab 01/01/2015 0052  NA 141  K 3.4*  CL 113*  CO2 22  BUN 10  CREATININE 0.74  GLUCOSE 99   Electrolytes  Recent Labs Lab 01/02/2015 0052  CALCIUM 8.7*   Sepsis Markers No results for input(s): LATICACIDVEN, PROCALCITON, O2SATVEN in the last 168 hours. ABG  Recent Labs Lab 12/25/2014 0239 12/24/2014 0530  PHART 7.36 7.405  PCO2ART 36 33.5*  PO2ART 102 147*   Liver Enzymes No results for input(s): AST, ALT, ALKPHOS, BILITOT, ALBUMIN in the last 168 hours. Cardiac Enzymes  Recent Labs Lab 12/28/2014 0052  TROPONINI 0.04*   Glucose  Recent Labs Lab 01/02/2015 0528 01/13/2015 0813  GLUCAP 144* 123*    Imaging  9/27 CXR images reviewed: ET tube in place, by basilar atelectasis,  dual-chamber pacemaker in place, no coil noted on pacemaker wires  ASSESSMENT / PLAN:  Principal Problem:   Subdural hematoma Active Problems:   A-fib   Adult hypothyroidism   Coma  Discussion: Ruth Hodges was extubated from mechanical ventilation 9/28 and has been transferred to the floor with a morphine infusion for comfort. There have been no major acute events overnight and she is currently resting comfortably. She remains comatose. There was no response on my exam today. She will die in the hospital.  Continue morphine infusion Continue Ativan infusion Continue mouth care  Case discussed with nursing, no  acute issues Family updated at length 9/29   Roselie Awkward, MD [place] PCCM Pager: [phone] Cell: [phone] After 3pm or if no response, call [phone]

## 2015-01-16 DIAGNOSIS — R5081 Fever presenting with conditions classified elsewhere: Secondary | ICD-10-CM

## 2015-01-16 DIAGNOSIS — G935 Compression of brain: Secondary | ICD-10-CM

## 2015-01-16 NOTE — Progress Notes (Signed)
PULMONARY / CRITICAL CARE MEDICINE   Name: Ruth Hodges MRN: [id number] DOB: Sep 03, 1940    ADMISSION DATE:  12/29/2014 CONSULTATION DATE:  12/23/2014  REFERRING MD :  [facility] EDP  Diagnosis:  1) Spontaneous subdural hematoma 2) Coma   INITIAL PRESENTATION and hospital course:  74 y.o. F brought to [facility] ED 9/26 with headache x 2 days.  Found to have left sided SDH; note, she is on Eliquis for A.Fib.  She was intubated and given K-Centra prior to being transferred to [facility] for further evaluation and management.  She was evaluated by neurosurgery in the emergency department. She was found to have left to right midline shift secondary to mass effect from the subdural hematoma. Neurosurgery felt that regardless of intervention her outcome was incredibly poor and she would not survive the illness. The family was updated in the emergency department regarding this. After extensive conversation with the critical care team on admission it was decided to make her CODE STATUS DO NOT RESUSCITATE and to not perform aggressive measures. She had been intubated in the emergency department but then extubated on 01/13/2015 with palliative intent.  STUDIES:   CT head 9/27 >>> moderate sized acute left SDH with 17mm L to R MLS. CXR 9/27 >>> ETT in good position.  SIGNIFICANT EVENTS: 9/27 - transferred from [facility] to [facility] with left SDH, intubated prior to transfer. September 28> extubated, morphine drip, Ativan drip   Overnight events as of 01/15/2015: No acute events, Remains unresponsive.  VITAL SIGNS: Temp:  [102.9 F (39.4 C)-103.2 F (39.6 C)] 103.2 F (39.6 C) (10/01 0414) Pulse Rate:  [114-132] 132 (10/01 0414) Resp:  [12] 12 (10/01 0414) BP: (66-90)/(38-44) 66/44 mmHg (10/01 0414) SpO2:  [91 %-92 %] 92 % (10/01 0414) Weight:  [74.1 kg (163 lb 5.8 oz)] 74.1 kg (163 lb 5.8 oz) (10/01 0414) HEMODYNAMICS:   VENTILATOR SETTINGS:   INTAKE / OUTPUT: Intake/Output      09/30 0701 - 10/01 0700 10/01 0701 -  10/02 0700   P.O. 0    Total Intake(mL/kg) 0 (0)    Urine (mL/kg/hr) 325 (0.2)    Total Output 325     Net -325           PHYSICAL EXAMINATION: Gen: elderly female, comfortable, no obvious distress, unresponsive HENT: OP clear, NCAT PULM: Regular resp, unlabored. Faint rhonchi R mid chest CV: irreg irreg, no mgr, trace edema GI: BS+, soft, nontender Derm: no cyanosis or rash Neuro: no response to painful stimuli, non-verbal, no eye opening, pupils ~8mm, no response to light, no corneal reflex   LABS:  CBC  Recent Labs Lab 01/13/2015 0052  WBC 11.2*  HGB 9.4*  HCT 29.8*  PLT 190   Coag's  Recent Labs Lab 12/26/2014 0332  INR 1.48   BMET  Recent Labs Lab 01/08/2015 0052  NA 141  K 3.4*  CL 113*  CO2 22  BUN 10  CREATININE 0.74  GLUCOSE 99   Electrolytes  Recent Labs Lab 01/15/2015 0052  CALCIUM 8.7*   Sepsis Markers No results for input(s): LATICACIDVEN, PROCALCITON, O2SATVEN in the last 168 hours. ABG  Recent Labs Lab 12/17/2014 0239 01/11/2015 0530  PHART 7.36 7.405  PCO2ART 36 33.5*  PO2ART 102 147*   Liver Enzymes No results for input(s): AST, ALT, ALKPHOS, BILITOT, ALBUMIN in the last 168 hours. Cardiac Enzymes  Recent Labs Lab 01/08/2015 0052  TROPONINI 0.04*   Glucose  Recent Labs Lab 12/21/2014 0528 01/03/2015 0813  GLUCAP 144* 123*  Imaging  9/27 CXR images reviewed: ET tube in place, by basilar atelectasis, dual-chamber pacemaker in place, no coil noted on pacemaker wires  ASSESSMENT / PLAN: Subdural hematoma with subfalcine and uncal herniation   Principal Problem:   Subdural hematoma Active Problems:   A-fib   Adult hypothyroidism   Coma Fever  Discussion: Ruth Hodges was extubated from mechanical ventilation 9/28 and has been transferred to the floor with a morphine infusion for comfort. Febrile now- central vs secondary. She is currently resting comfortably. She remains comatose. There was no response on my exam today.  She will die in the hospital. She remains DNR, Comfort Care.  Continue morphine infusion Continue Ativan infusion Continue mouth care  Case discussed with nursing, no acute issues    CD Young, MD [place] [phone] Cell: [phone] After 3pm or if no response, call [phone]

## 2015-01-16 NOTE — Progress Notes (Signed)
Pt paged  Vineet Sona on call MD for update pt temp 103.3 BP 66/44, endorsed to am nurse.

## 2015-01-16 DEATH — deceased

## 2015-01-19 ENCOUNTER — Telehealth: Payer: Self-pay

## 2015-01-19 NOTE — Telephone Encounter (Signed)
On 01/19/2015 I received a death certificate from [place]. The death certificate is for cremation. The patient is a patient of Doctor Young. The death certificate will be taken to the pulmonary unit tomorrow am for signature. On 02-08-2015 I received the death certificate from [place].  I got the death certificate ready for pickup and called the funeral home to let them know the death certificate is ready for pickup.

## 2015-01-27 ENCOUNTER — Ambulatory Visit: Payer: Medicare Other | Admitting: Family Medicine

## 2015-02-16 NOTE — Discharge Summary (Signed)
PULMONARY / CRITICAL CARE MEDICINE   Name: Ruth Hodges MRN: [id number] DOB: 02/21/1941    ADMISSION DATE:  01/11/2015 CONSULTATION DATE:  01/11/2015  REFERRING MD :  [facility] EDP  Diagnosis:  1) Spontaneous subdural hematoma with brain herniation 2) Coma 3) Atrial fibrillation with chronic anticoagulation 4) Acute respiratory failure 5) DNR/ Comfort Care status  INITIAL PRESENTATION and hospital course:  74 y.o. F brought to [facility] ED 9/26 with headache x 2 days.  Found to have left sided SDH; note, she is on Eliquis for A.Fib.  She was intubated and given K-Centra prior to being transferred to [facility] for further evaluation and management.  She was evaluated by neurosurgery in the emergency department. She was found to have left to right midline shift secondary to mass effect from the subdural hematoma. Neurosurgery felt that regardless of intervention her outcome was incredibly poor and she would not survive the illness. The family was updated in the emergency department regarding this. After extensive conversation with the critical care team on admission it was decided to make her CODE STATUS DO NOT RESUSCITATE and to not perform aggressive measures. She had been intubated in the emergency department but then extubated on 01/13/2015 with palliative intent.  STUDIES:   CT head 9/27 >>> moderate sized acute left SDH with 50mm L to R MLS. CXR 9/27 >>> ETT in good position.  SIGNIFICANT EVENTS: 9/27 - transferred from [facility] to [facility] with left SDH, intubated prior to transfer. September 28> extubated, morphine drip, Ativan drip    PHYSICAL EXAMINATION as of 01-23-15 Gen: elderly female, comfortable, no obvious distress, unresponsive HENT: OP clear, NCAT PULM: Breathing is more agonal with bilateral mild rhonchi CV: irreg irreg, no mgr, trace edema GI: BS+, soft, nontender Derm: no cyanosis or rash Neuro: no response to painful stimuli, non-verbal, no eye opening, pupils ~46mm, no response to light, no  corneal reflex   LABS:  CBC No results for input(s): WBC, HGB, HCT, PLT in the last 168 hours. Coag's No results for input(s): APTT, INR in the last 168 hours. BMET No results for input(s): NA, K, CL, CO2, BUN, CREATININE, GLUCOSE in the last 168 hours. Electrolytes No results for input(s): CALCIUM, MG, PHOS in the last 168 hours. Sepsis Markers No results for input(s): LATICACIDVEN, PROCALCITON, O2SATVEN in the last 168 hours. ABG No results for input(s): PHART, PCO2ART, PO2ART in the last 168 hours. Liver Enzymes No results for input(s): AST, ALT, ALKPHOS, BILITOT, ALBUMIN in the last 168 hours. Cardiac Enzymes No results for input(s): TROPONINI, PROBNP in the last 168 hours. Glucose No results for input(s): GLUCAP in the last 168 hours.  Imaging  9/27 CXR images reviewed: ET tube in place, by basilar atelectasis, dual-chamber pacemaker in place, no coil noted on pacemaker wires  ASSESSMENT / PLAN: Subdural hematoma with subfalcine and uncal herniation   Principal Problem:   Subdural hematoma (HCC)    Left Uncal and Subfalcine brain herniation Active Problems:   A-fib [facility])   Adult hypothyroidism   Coma ([place]) Fever DNR/ comfort status  Hospital course: Ms. Oscarson was extubated from mechanical ventilation 9/28 and was been transferred to the floor with a morphine infusion for comfort. She was noted to be without pulse or respiration and pronounced appropriately by the nurse per protocol on 01/23/2015 at 16:20 PM. Family was notified. Cause of death was attributed to brain herniation secondary to atraumatic spontaneous subdural hematoma in this patient on anticoagulant therapy for chronic atrial fibrillation.  CD Annamaria Boots, MD [place] PCCM Pager: [phone]

## 2015-02-16 NOTE — Progress Notes (Signed)
PULMONARY / CRITICAL CARE MEDICINE   Name: Zayne Marovich MRN: [id number] DOB: [birth year]    ADMISSION DATE:  12/25/2014 CONSULTATION DATE:  01/02/2015  REFERRING MD :  [facility] EDP  Diagnosis:  1) Spontaneous subdural hematoma 2) Coma   INITIAL PRESENTATION and hospital course:  74 y.o. F brought to [facility] ED 9/26 with headache x 2 days.  Found to have left sided SDH; note, she is on Eliquis for A.Fib.  She was intubated and given K-Centra prior to being transferred to [facility] for further evaluation and management.  She was evaluated by neurosurgery in the emergency department. She was found to have left to right midline shift secondary to mass effect from the subdural hematoma. Neurosurgery felt that regardless of intervention her outcome was incredibly poor and she would not survive the illness. The family was updated in the emergency department regarding this. After extensive conversation with the critical care team on admission it was decided to make her CODE STATUS DO NOT RESUSCITATE and to not perform aggressive measures. She had been intubated in the emergency department but then extubated on 01/13/2015 with palliative intent.  STUDIES:   CT head 9/27 >>> moderate sized acute left SDH with 19mm L to R MLS. CXR 9/27 >>> ETT in good position.  SIGNIFICANT EVENTS: 9/27 - transferred from [facility] to [facility] with left SDH, intubated prior to transfer. September 28> extubated, morphine drip, Ativan drip   Overnight events as of 01/15/2015: No acute events, Remains unresponsive.  VITAL SIGNS: Temp:  [100 F (37.8 C)-102.7 F (39.3 C)] 100 F (37.8 C) (10/02 0500) Pulse Rate:  [125-135] 125 (10/02 0500) Resp:  [7-8] 8 (10/02 0500) BP: (67-69)/(31-46) 69/31 mmHg (10/02 0500) SpO2:  [92 %-94 %] 94 % (10/02 0500) HEMODYNAMICS:   VENTILATOR SETTINGS:   INTAKE / OUTPUT: Intake/Output      10/01 0701 - 10/02 0700 10/02 0701 - 10/03 0700   P.O. 0    I.V. (mL/kg) 1285.8 (17.4)    Total Intake(mL/kg)  1285.8 (17.4)    Urine (mL/kg/hr) 50 (0)    Total Output 50     Net +1235.8           PHYSICAL EXAMINATION: Gen: elderly female, comfortable, no obvious distress, unresponsive HENT: OP clear, NCAT PULM: Breathing is more agonal with bilateral mild rhonchi CV: irreg irreg, no mgr, trace edema GI: BS+, soft, nontender Derm: no cyanosis or rash Neuro: no response to painful stimuli, non-verbal, no eye opening, pupils ~62mm, no response to light, no corneal reflex   LABS:  CBC  Recent Labs Lab 12/22/2014 0052  WBC 11.2*  HGB 9.4*  HCT 29.8*  PLT 190   Coag's  Recent Labs Lab 01/04/2015 0332  INR 1.48   BMET  Recent Labs Lab 01/13/2015 0052  NA 141  K 3.4*  CL 113*  CO2 22  BUN 10  CREATININE 0.74  GLUCOSE 99   Electrolytes  Recent Labs Lab 01/04/2015 0052  CALCIUM 8.7*   Sepsis Markers No results for input(s): LATICACIDVEN, PROCALCITON, O2SATVEN in the last 168 hours. ABG  Recent Labs Lab 12/19/2014 0239 12/31/2014 0530  PHART 7.36 7.405  PCO2ART 36 33.5*  PO2ART 102 147*   Liver Enzymes No results for input(s): AST, ALT, ALKPHOS, BILITOT, ALBUMIN in the last 168 hours. Cardiac Enzymes  Recent Labs Lab 01/02/2015 0052  TROPONINI 0.04*   Glucose  Recent Labs Lab 01/11/2015 0528 01/08/2015 0813  GLUCAP 144* 123*    Imaging  9/27 CXR images reviewed:  ET tube in place, by basilar atelectasis, dual-chamber pacemaker in place, no coil noted on pacemaker wires  ASSESSMENT / PLAN: Subdural hematoma with subfalcine and uncal herniation   Principal Problem:   Subdural hematoma (HCC)    Left Uncal and Subfalcine brain herniation Active Problems:   A-fib ([place])   Adult hypothyroidism   Coma ([place]) Fever DNR/ comfort status  Discussion: Ms. Cude was extubated from mechanical ventilation 9/28 and has been transferred to the floor with a morphine infusion for comfort. Febrile now- central vs secondary. She is currently resting comfortably. She remains  comatose. There was no response on my exam today. She will die in the hospital. She remains DNR, Comfort Care.  Continue morphine infusion Continue Ativan infusion Continue mouth care      CD Annamaria Boots, MD [place] Pager: [phone] Cell: [phone] After 3pm or if no response, call [phone]

## 2015-02-16 NOTE — Progress Notes (Addendum)
Patient was noted with no respiration, no pulse, pooling of blood in feet and hands at around 16:20. Patient is a DNR, patient's son in law was notified, physician was also notified. Patient is not a medical examiners case as per Wynona Canes ME. [facility] was also notified. Patient has a wedding set ring that cannot be removed due to swollen fingers, but family said okay to leave it on patient.

## 2015-02-16 DEATH — deceased

## 2016-06-28 IMAGING — CT CT HEAD W/O CM
1 series · 15 of 30 positions shown, 19 images · non-contrast
Comparison: None.

CLINICAL DATA: Laceration along the vertex after a fall.

EXAM:
CT HEAD WITHOUT CONTRAST
TECHNIQUE: Contiguous axial images were obtained from the base of the skull
through the vertex without intravenous contrast.

[Series 2: head wo · axial · 0.40mm/px · z∈[+322,+472]mm · 15 of 34 slices shown, 19 images]
[im 2/34  brain]
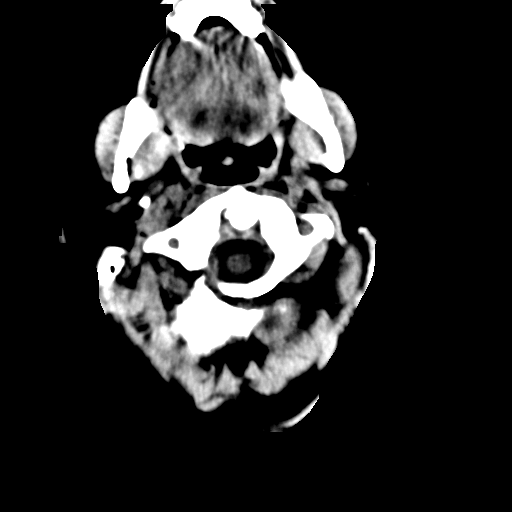
[im 2/34  bone]
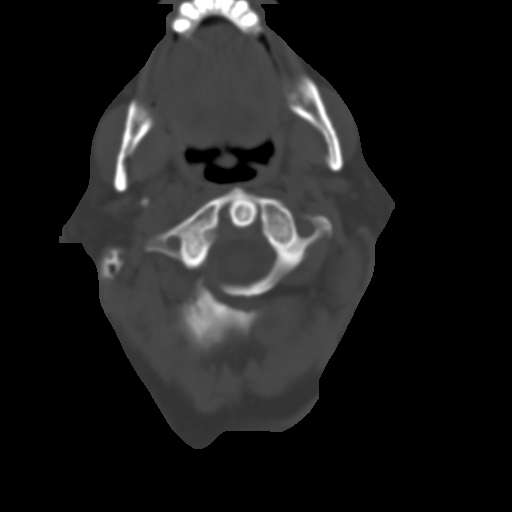
[im 4/34  brain]
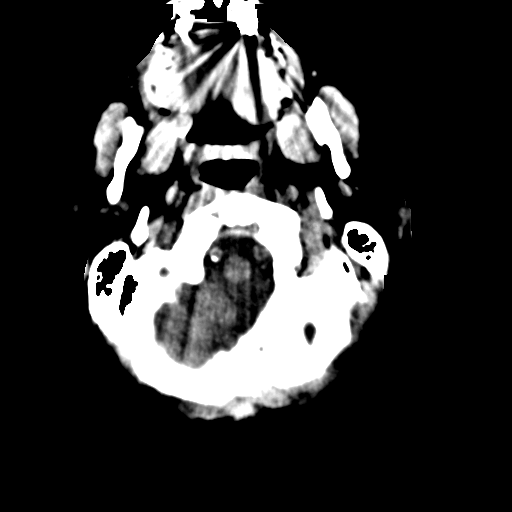
[im 6/34  brain]
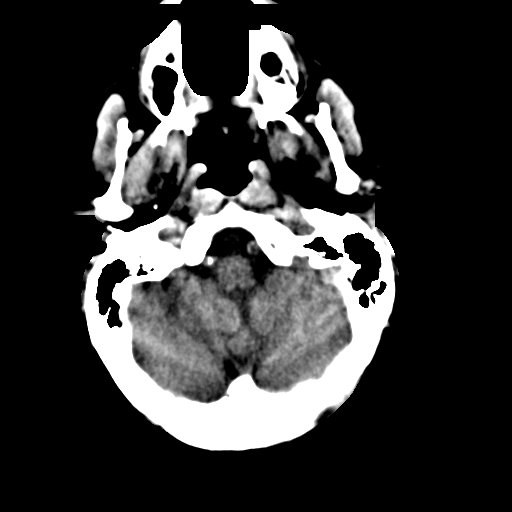
[im 8/34  brain]
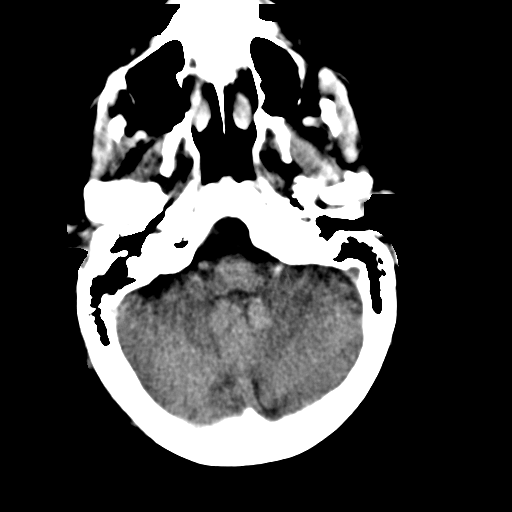
[im 11/34  brain]
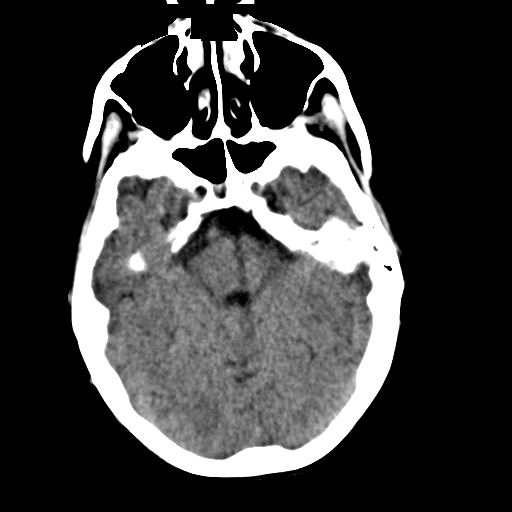
[im 11/34  bone]
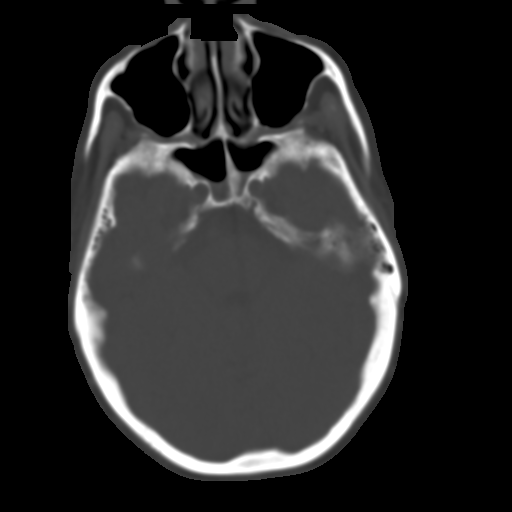
[im 13/34  brain]
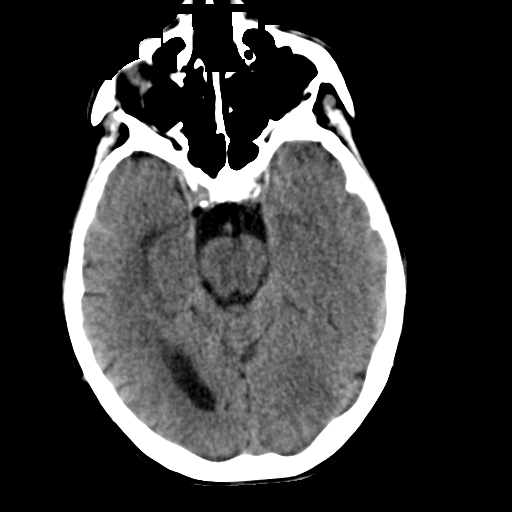
[im 15/34  brain]
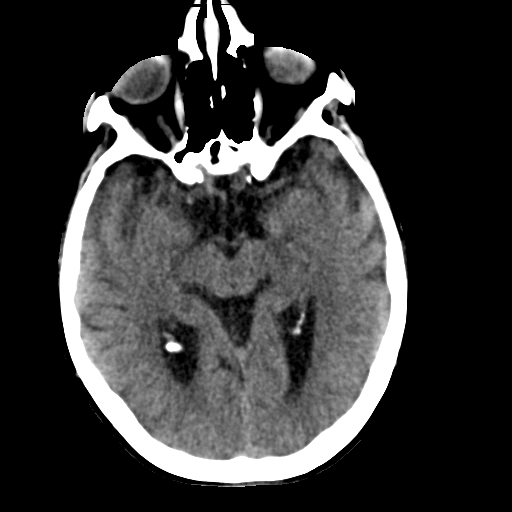
[im 18/34  brain]
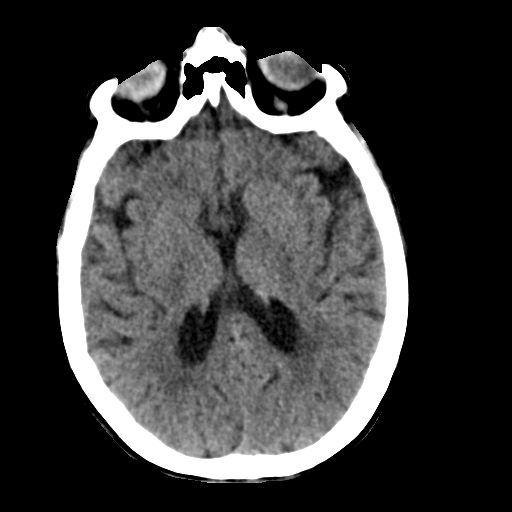
[im 19/34  brain]
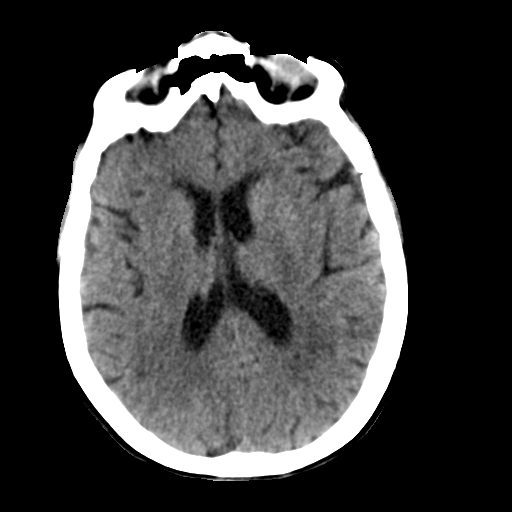
[im 19/34  bone]
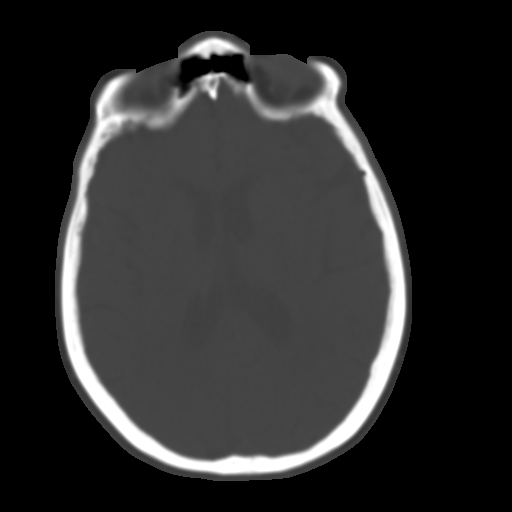
[im 21/34  brain]
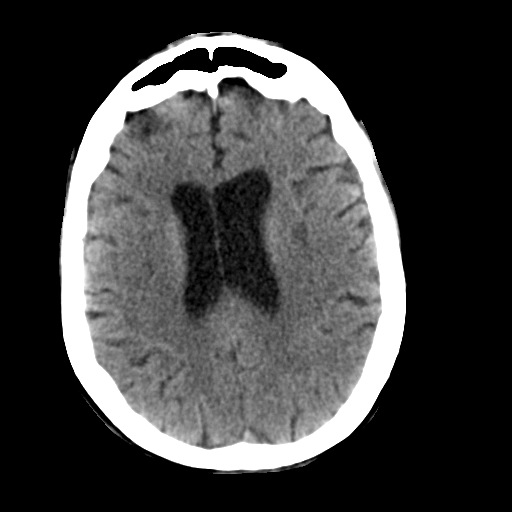
[im 23/34  brain]
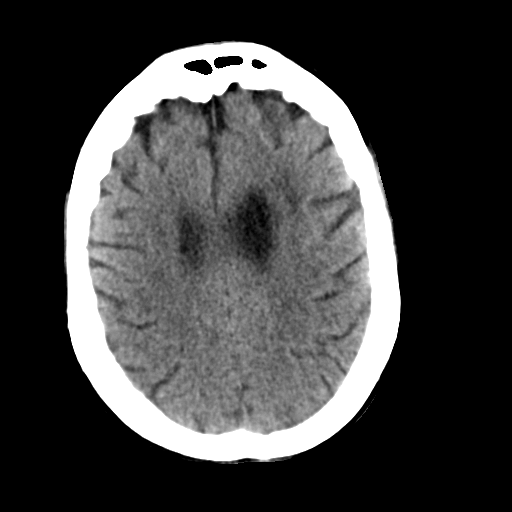
[im 26/34  brain]
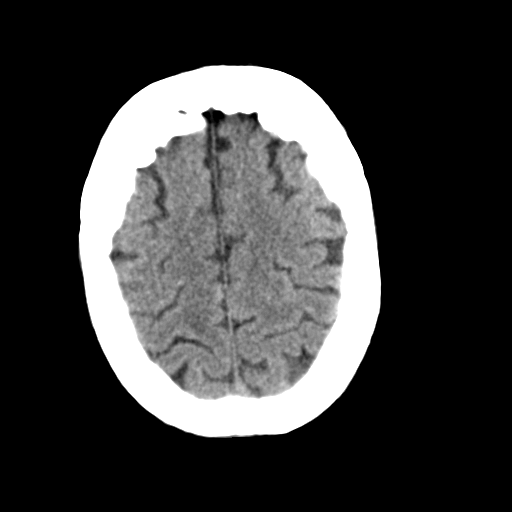
[im 28/34  brain]
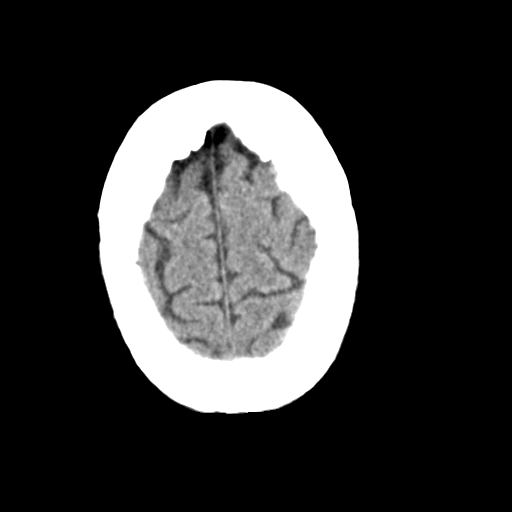
[im 28/34  bone]
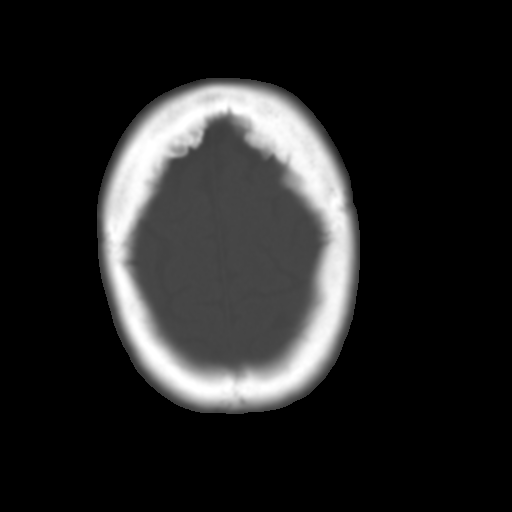
[im 30/34  brain]
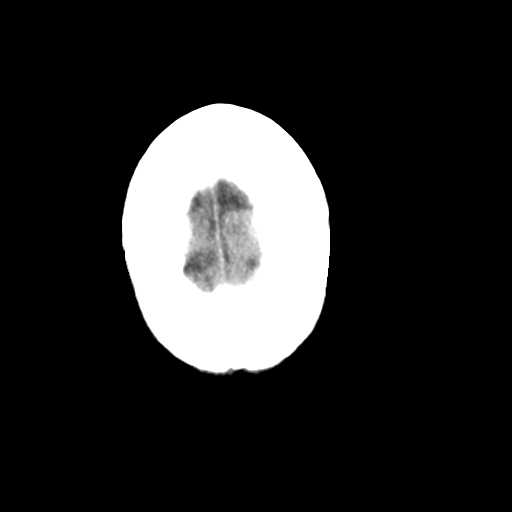
[im 32/34  brain]
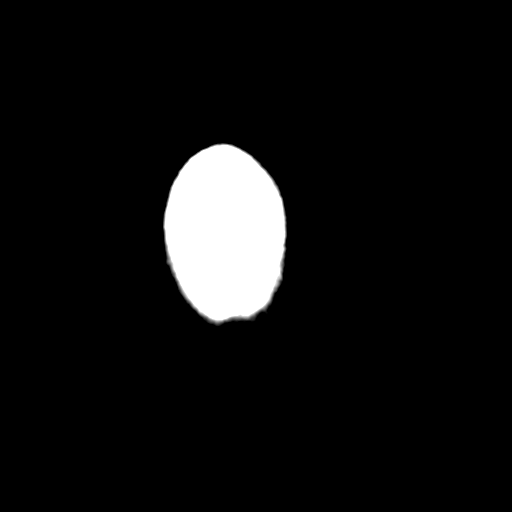

[15 of 30 positions shown; findings below may reference images not displayed]

FINDINGS: 1 cm dilated perivascular space or lacunar infarct along the
inferior margin of the left lentiform nucleus.

Periventricular white matter and corona radiata hypodensities favor
chronic ischemic microvascular white matter disease. Despite efforts
by the technologist and patient, motion artifact is present on
today's exam and could not be eliminated. This reduces exam
sensitivity and specificity.

Scalp laceration along the left parietal vertex. A supernumerary or
unerupted tooth is present along the inferior margin of the left
maxillary sinus.

Acute on chronic bilateral sphenoid sinusitis.

Hyperostosis frontalis interna.
IMPRESSION: 1. No acute intracranial findings.
2. Left parietal vertex scalp laceration.
3. Acute on chronic bilateral sphenoid sinusitis.
4. Dilated perivascular space or remote lacunar infarct along the
anterior inferior aspect the left lentiform nucleus.
5. Unerupted or supernumerary tooth along the inferior margin the
left maxillary sinus.

## 2016-08-09 IMAGING — CR DG CHEST 1V PORT
1 series · 1 of 1 positions shown · non-contrast
Comparison: Chest radiograph January 12, 2015 at [DATE] a.m.

CLINICAL DATA: Followup subdural hematoma. History of atrial
fibrillation, hypertension, hyperlipidemia.

EXAM:
PORTABLE CHEST 1 VIEW

[AP]
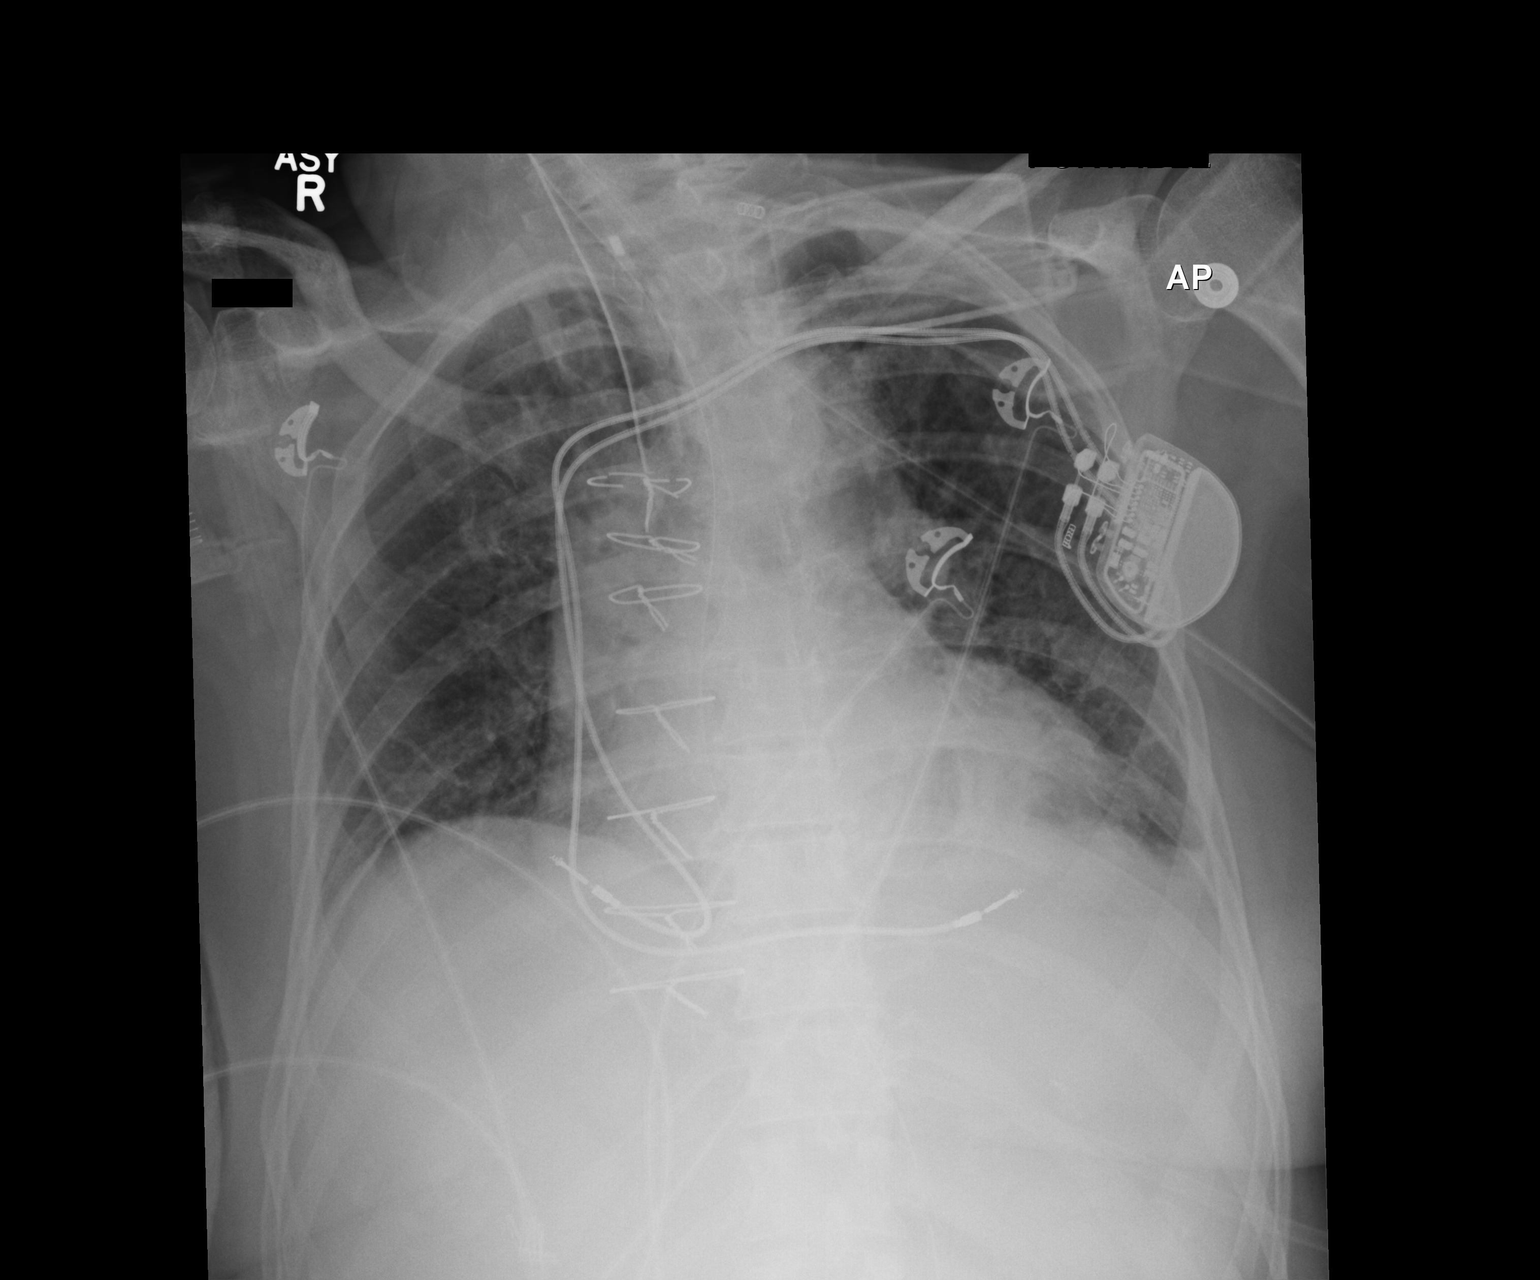

[1 of 1 positions shown; findings below may reference images not displayed]

FINDINGS: Cardiac silhouette is mildly enlarged. Status post median sternotomy
with fractured most proximal wires. Stable retrocardiac patchy
consolidation. Small LEFT pleural effusion. No pneumothorax. Dual
lead LEFT cardiac pacemaker in situ.

Patient is rotated to the RIGHT. Endotracheal tube tip projects
within 1 cm of the carina. Nasogastric tube tip not imaged, side
port past GE junction.
IMPRESSION: Endotracheal tube tip projects within 1 cm of the carina,
nasogastric tube tip past the proximal stomach.

Mild cardiomegaly, stable retrocardiac atelectasis/ consolidation.

## 2016-08-09 IMAGING — CT CT HEAD W/O CM
1 series · 15 of 30 positions shown, 19 images · non-contrast
Comparison: Head CT 12/01/2014

CLINICAL DATA: Headache for 2 days, progressive.

EXAM:
CT HEAD WITHOUT CONTRAST
TECHNIQUE: Contiguous axial images were obtained from the base of the skull
through the vertex without intravenous contrast.

[Series 2: head wo · axial · 0.45mm/px · z∈[-129,+6]mm · 15 of 30 slices shown, 19 images]
[im 2/30  brain]
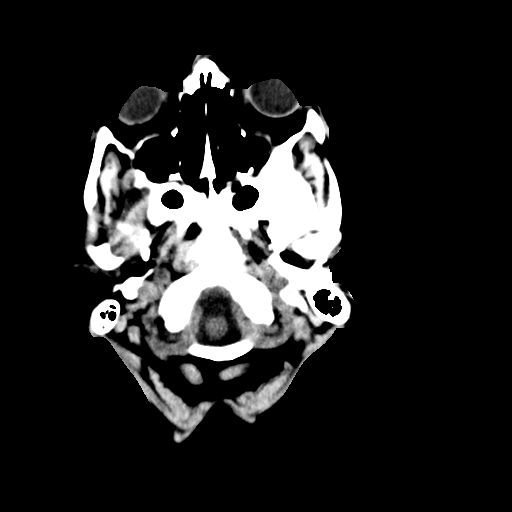
[im 2/30  bone]
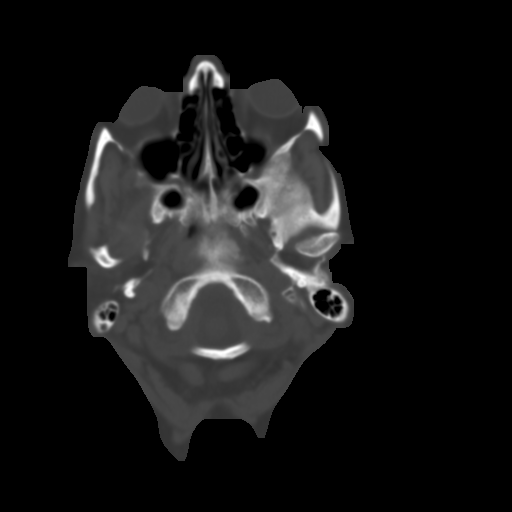
[im 4/30  brain]
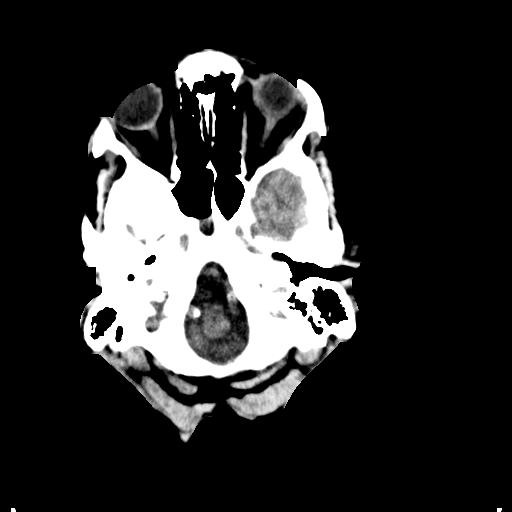
[im 6/30  brain]
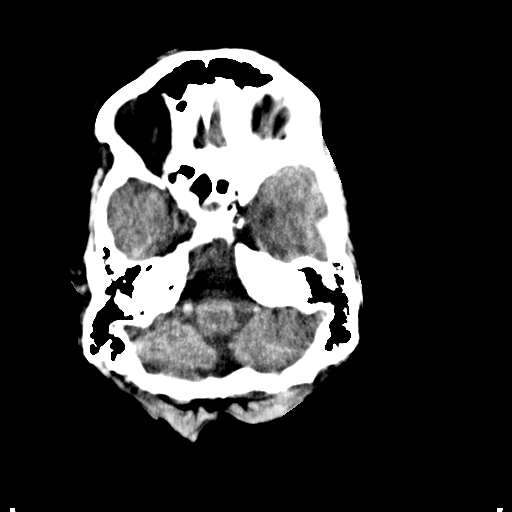
[im 8/30  brain]
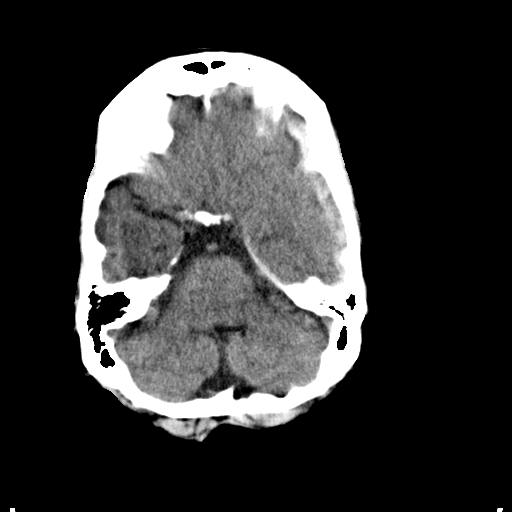
[im 10/30  brain]
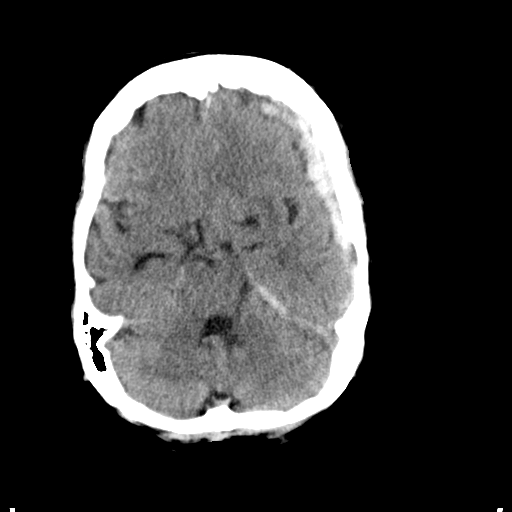
[im 10/30  bone]
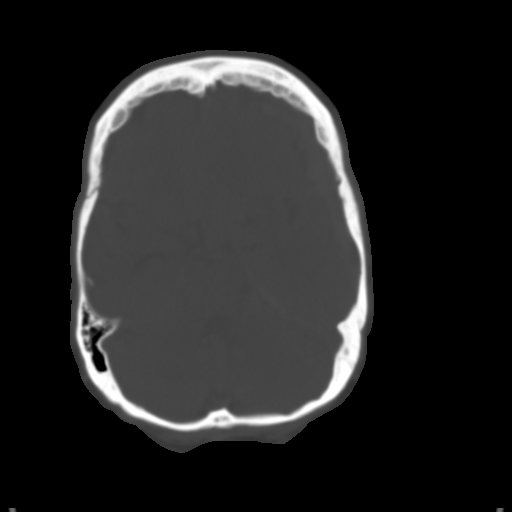
[im 12/30  brain]
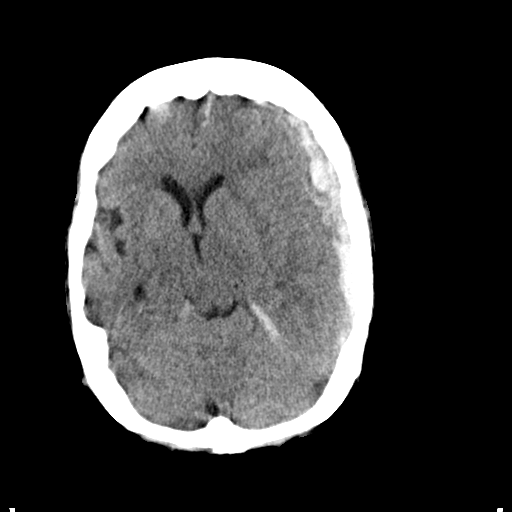
[im 14/30  brain]
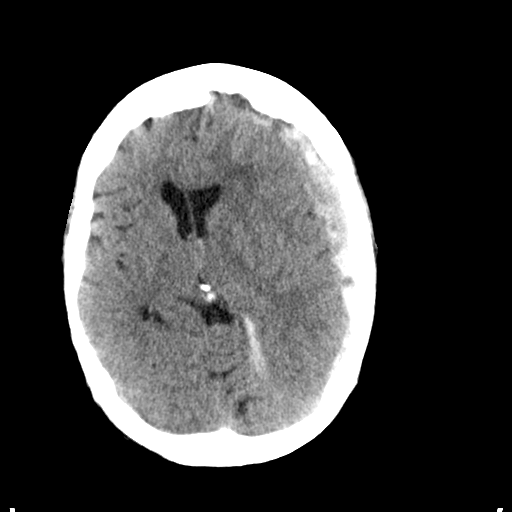
[im 16/30  brain]
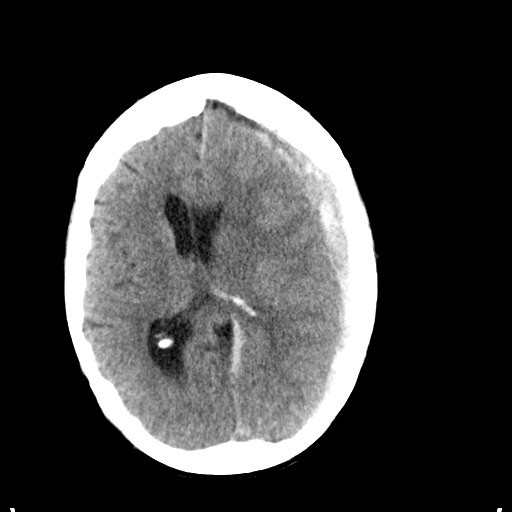
[im 17/30  brain]
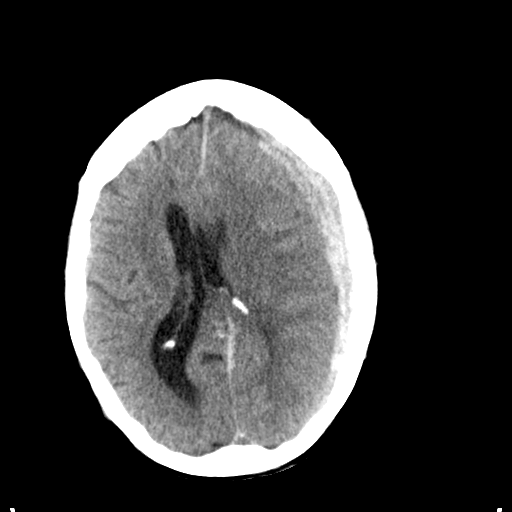
[im 17/30  bone]
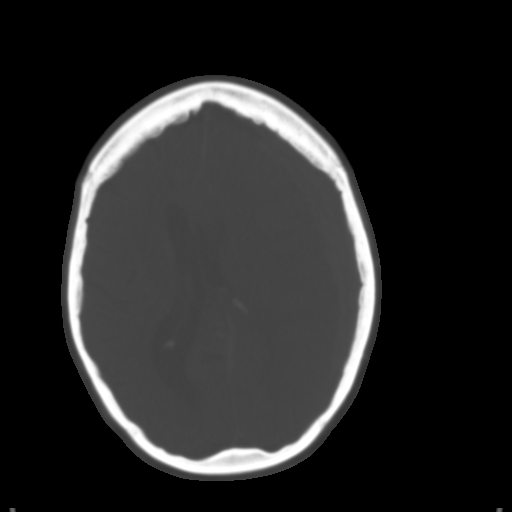
[im 19/30  brain]
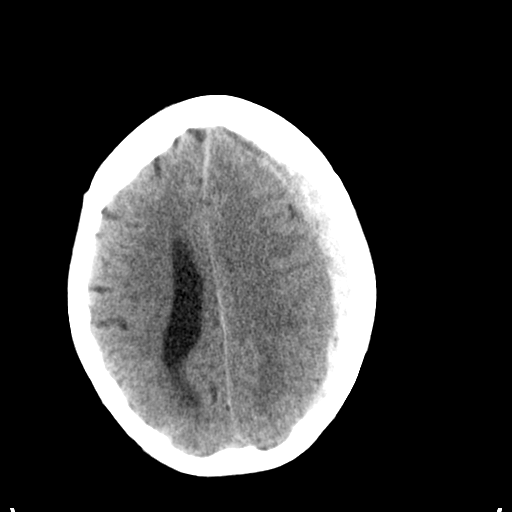
[im 21/30  brain]
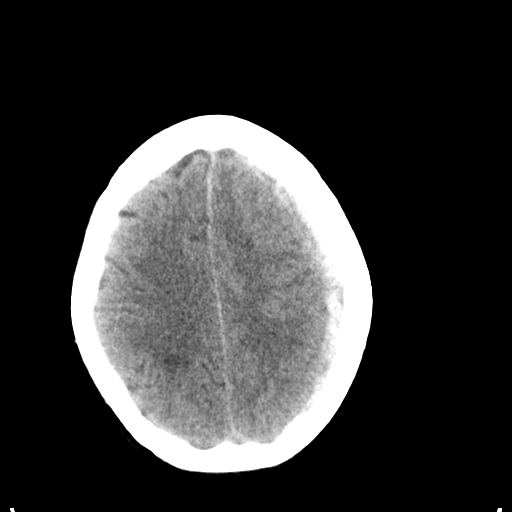
[im 23/30  brain]
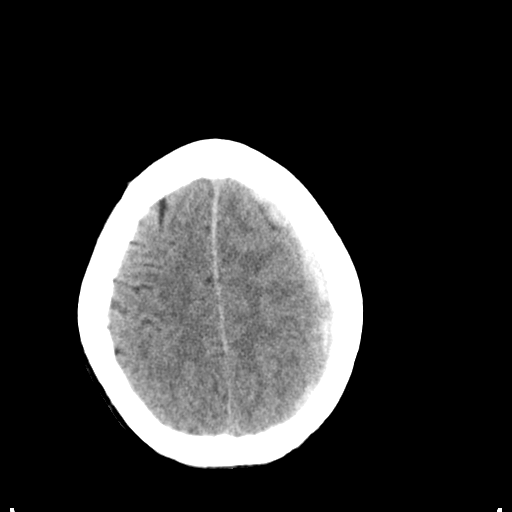
[im 25/30  brain]
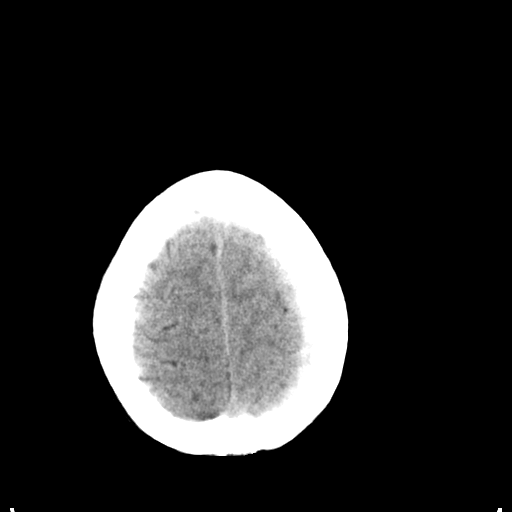
[im 25/30  bone]
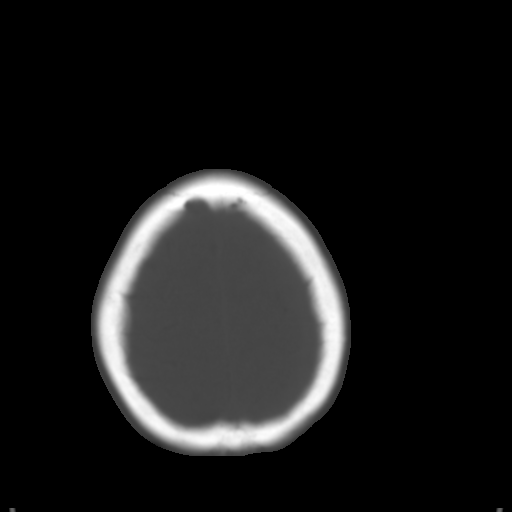
[im 27/30  brain]
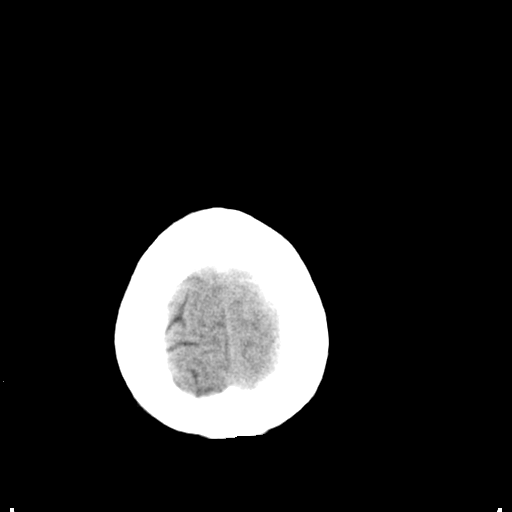
[im 29/30  brain]
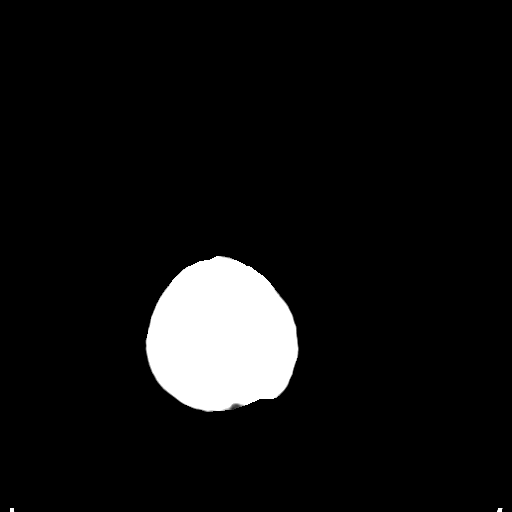

[15 of 30 positions shown; findings below may reference images not displayed]

FINDINGS: Acute left subdural hematoma measuring up to 1.3 cm in depth. This
is near circumferential with minimal layering along the falx and
tentorium. There is associated 7 mm left-to-right midline shift and
effacement of the left lateral ventricle. The basilar cisterns
remain patent. No intraventricular hemorrhage. Remote lacunar
infarct versus enlarged perivascular space again seen in the left
basal ganglia. No calvarial fracture. Fluid levels in the right and
left sphenoid sinus, similar to prior exam.
IMPRESSION: Moderate-sized acute left subdural hematoma measuring up to 1.3 cm
in depth with associated 7 mm left-to-right midline shift.

Critical Value/emergent results were called by telephone at the time
of interpretation on 01/12/2015 at [DATE] to Dr. DIEMO ANDRIS ,
who verbally acknowledged these results.
